# Patient Record
Sex: Female | Born: 1965
Health system: Southern US, Community
[De-identification: ages and names within clinical notes are randomized; demographics above are authoritative.]

## PROBLEM LIST (undated history)

## (undated) DIAGNOSIS — M1990 Unspecified osteoarthritis, unspecified site: Secondary | ICD-10-CM

## (undated) DIAGNOSIS — F909 Attention-deficit hyperactivity disorder, unspecified type: Secondary | ICD-10-CM

## (undated) DIAGNOSIS — E538 Deficiency of other specified B group vitamins: Secondary | ICD-10-CM

## (undated) DIAGNOSIS — R0602 Shortness of breath: Secondary | ICD-10-CM

## (undated) DIAGNOSIS — F419 Anxiety disorder, unspecified: Secondary | ICD-10-CM

## (undated) DIAGNOSIS — J302 Other seasonal allergic rhinitis: Secondary | ICD-10-CM

## (undated) DIAGNOSIS — E669 Obesity, unspecified: Secondary | ICD-10-CM

## (undated) DIAGNOSIS — K760 Fatty (change of) liver, not elsewhere classified: Secondary | ICD-10-CM

## (undated) DIAGNOSIS — F1010 Alcohol abuse, uncomplicated: Secondary | ICD-10-CM

## (undated) DIAGNOSIS — J45909 Unspecified asthma, uncomplicated: Secondary | ICD-10-CM

## (undated) DIAGNOSIS — F1990 Other psychoactive substance use, unspecified, uncomplicated: Secondary | ICD-10-CM

## (undated) DIAGNOSIS — D219 Benign neoplasm of connective and other soft tissue, unspecified: Secondary | ICD-10-CM

## (undated) DIAGNOSIS — I10 Essential (primary) hypertension: Secondary | ICD-10-CM

## (undated) DIAGNOSIS — I8390 Asymptomatic varicose veins of unspecified lower extremity: Secondary | ICD-10-CM

## (undated) DIAGNOSIS — G4730 Sleep apnea, unspecified: Secondary | ICD-10-CM

## (undated) DIAGNOSIS — R600 Localized edema: Secondary | ICD-10-CM

## (undated) DIAGNOSIS — F32A Depression, unspecified: Secondary | ICD-10-CM

## (undated) DIAGNOSIS — F329 Major depressive disorder, single episode, unspecified: Secondary | ICD-10-CM

## (undated) HISTORY — DX: Essential (primary) hypertension: I10

## (undated) HISTORY — DX: Anxiety disorder, unspecified: F41.9

## (undated) HISTORY — PX: COLONOSCOPY: SHX174

## (undated) HISTORY — DX: Attention-deficit hyperactivity disorder, unspecified type: F90.9

## (undated) HISTORY — DX: Sleep apnea, unspecified: G47.30

## (undated) HISTORY — DX: Deficiency of other specified B group vitamins: E53.8

## (undated) HISTORY — DX: Shortness of breath: R06.02

## (undated) HISTORY — PX: TONSILLECTOMY: SUR1361

## (undated) HISTORY — DX: Localized edema: R60.0

## (undated) HISTORY — DX: Other psychoactive substance use, unspecified, uncomplicated: F19.90

## (undated) HISTORY — DX: Fatty (change of) liver, not elsewhere classified: K76.0

## (undated) HISTORY — DX: Alcohol abuse, uncomplicated: F10.10

## (undated) HISTORY — DX: Obesity, unspecified: E66.9

---

## 1999-06-10 ENCOUNTER — Other Ambulatory Visit: Admission: RE | Admit: 1999-06-10 | Discharge: 1999-06-10 | Payer: Self-pay | Admitting: Obstetrics and Gynecology

## 1999-08-09 ENCOUNTER — Encounter: Admission: RE | Admit: 1999-08-09 | Discharge: 1999-08-09 | Payer: Self-pay | Admitting: Obstetrics and Gynecology

## 1999-08-09 ENCOUNTER — Encounter: Payer: Self-pay | Admitting: Obstetrics and Gynecology

## 1999-08-12 ENCOUNTER — Encounter: Admission: RE | Admit: 1999-08-12 | Discharge: 1999-08-12 | Payer: Self-pay | Admitting: Obstetrics and Gynecology

## 1999-08-12 ENCOUNTER — Encounter: Payer: Self-pay | Admitting: Obstetrics and Gynecology

## 2001-08-08 ENCOUNTER — Other Ambulatory Visit: Admission: RE | Admit: 2001-08-08 | Discharge: 2001-08-08 | Payer: Self-pay | Admitting: Obstetrics and Gynecology

## 2002-11-13 ENCOUNTER — Encounter: Payer: Self-pay | Admitting: Obstetrics and Gynecology

## 2002-11-13 ENCOUNTER — Encounter: Admission: RE | Admit: 2002-11-13 | Discharge: 2002-11-13 | Payer: Self-pay | Admitting: Obstetrics and Gynecology

## 2003-12-29 ENCOUNTER — Encounter: Admission: RE | Admit: 2003-12-29 | Discharge: 2003-12-29 | Payer: Self-pay | Admitting: Obstetrics and Gynecology

## 2005-06-05 ENCOUNTER — Ambulatory Visit (HOSPITAL_COMMUNITY): Admission: RE | Admit: 2005-06-05 | Discharge: 2005-06-05 | Payer: Self-pay | Admitting: Family Medicine

## 2005-06-07 ENCOUNTER — Encounter: Admission: RE | Admit: 2005-06-07 | Discharge: 2005-06-07 | Payer: Self-pay | Admitting: Obstetrics and Gynecology

## 2005-08-22 ENCOUNTER — Encounter: Admission: RE | Admit: 2005-08-22 | Discharge: 2005-08-22 | Payer: Self-pay | Admitting: *Deleted

## 2005-12-01 ENCOUNTER — Ambulatory Visit (HOSPITAL_COMMUNITY): Admission: RE | Admit: 2005-12-01 | Discharge: 2005-12-01 | Payer: Self-pay | Admitting: Obstetrics and Gynecology

## 2005-12-01 ENCOUNTER — Encounter (INDEPENDENT_AMBULATORY_CARE_PROVIDER_SITE_OTHER): Payer: Self-pay | Admitting: Specialist

## 2006-06-26 ENCOUNTER — Encounter: Admission: RE | Admit: 2006-06-26 | Discharge: 2006-06-26 | Payer: Self-pay | Admitting: Obstetrics and Gynecology

## 2007-07-01 ENCOUNTER — Encounter: Admission: RE | Admit: 2007-07-01 | Discharge: 2007-07-01 | Payer: Self-pay | Admitting: Obstetrics and Gynecology

## 2008-07-01 ENCOUNTER — Encounter: Admission: RE | Admit: 2008-07-01 | Discharge: 2008-07-01 | Payer: Self-pay | Admitting: Obstetrics and Gynecology

## 2009-07-02 ENCOUNTER — Encounter: Admission: RE | Admit: 2009-07-02 | Discharge: 2009-07-02 | Payer: Self-pay | Admitting: Obstetrics and Gynecology

## 2010-07-05 ENCOUNTER — Encounter
Admission: RE | Admit: 2010-07-05 | Discharge: 2010-07-05 | Payer: Self-pay | Source: Home / Self Care | Attending: Obstetrics and Gynecology | Admitting: Obstetrics and Gynecology

## 2010-10-17 ENCOUNTER — Other Ambulatory Visit: Payer: Self-pay | Admitting: Obstetrics and Gynecology

## 2010-10-28 ENCOUNTER — Encounter: Payer: Self-pay | Admitting: Genetic Counselor

## 2010-11-07 ENCOUNTER — Encounter: Payer: 59 | Admitting: Genetic Counselor

## 2010-12-09 NOTE — Op Note (Signed)
Mary Morrison, Mary Morrison            ACCOUNT NO.:  [id number]   MEDICAL RECORD NO.:  [id number]          PATIENT TYPE:  AMB   LOCATION:  SDC                           FACILITY:  [facility]   PHYSICIAN:  Maxie Better, M.D.DATE OF BIRTH:  [birth year]   DATE OF PROCEDURE:  12/01/2005  DATE OF DISCHARGE:  12/01/2005                                 OPERATIVE REPORT   PREOPERATIVE DIAGNOSES:  1.  Menorrhagia.  2.  Endometrial mass.   PROCEDURE:  Diagnostic hysteroscopy, dilation and curettage.   POSTOPERATIVE DIAGNOSES:  1.  Menorrhagia.  2.  Endometrial mass, pending final pathology.   ANESTHESIA:  General, paracervical block.   SURGEON:  Maxie Better, M.D.   PROCEDURE:  Under adequate general anesthesia, the patient was placed in the  dorsolithotomy position.  She was sterilely prepped and draped in usual  fashion.  The bladder was catheterized of a small amount of urine.  Examination under anesthesia revealed an anteverted uterus..  No adnexal  masses could be appreciated.  Bivalve speculum was placed in the vagina; 10  cc of 1% Nesacaine was injected paracervically at 3 o'clock and 9 o'clock.  The cervix was then grasped with a single-tooth tenaculum anteriorly.  The  cervix was then serially dilated up to a #25 Pratt dilator.  A diagnostic  hysteroscope was introduced into the uterine cavity.  The left tubal ostia  was patent; the right was sclerosed.  The endocervical canal had no lesions.  The posterior and right lateral wall had thickening of the endometrium,  found in a polypoid fashion.  The hysteroscope was removed.  The cavity was  then curetted.  The hysteroscope was then reinserted, to assess the amount  of tissue that had been removed.   Due to difficulty with  distension medium in  keeping the cavity fully  opened, decision was made to dilate the cervix up to #31 [facility] dilator; and  a resectoscope was introduced into the uterine cavity.  The cavity was  further  inspected.  No polypoid lesions were subsequently noted after  additional curettage had been previously done; at which time the procedure  was felt to have been complete.  The endocervical canal was once again  examined.  No lesions noted.   The procedure was then terminated by removing all instruments from the  vagina.  Specimen labeled endometrial curetting; possible endometrial polyp  was sent to pathology.   ESTIMATED BLOOD LOSS:  Minimal.   FLUID DEFICIT:  100 cc.   COMPLICATION:  None.   DISPOSITION:  The patient tolerated the procedure well and was transferred  to the recovery room in stable condition.      Maxie Better, M.D.  Electronically Signed     [place]/MEDQ  D:  12/01/2005  T:  12/04/2005  Job:  [id number]

## 2011-06-22 ENCOUNTER — Other Ambulatory Visit: Payer: Self-pay | Admitting: Obstetrics and Gynecology

## 2011-06-22 DIAGNOSIS — Z1231 Encounter for screening mammogram for malignant neoplasm of breast: Secondary | ICD-10-CM

## 2011-07-07 ENCOUNTER — Ambulatory Visit
Admission: RE | Admit: 2011-07-07 | Discharge: 2011-07-07 | Disposition: A | Discharge status: Home / Self Care | Payer: 59 | Source: Ambulatory Visit | Attending: Obstetrics and Gynecology | Admitting: Obstetrics and Gynecology

## 2011-07-07 DIAGNOSIS — Z1231 Encounter for screening mammogram for malignant neoplasm of breast: Secondary | ICD-10-CM

## 2012-06-03 ENCOUNTER — Other Ambulatory Visit: Payer: Self-pay | Admitting: Obstetrics and Gynecology

## 2012-06-03 DIAGNOSIS — Z1231 Encounter for screening mammogram for malignant neoplasm of breast: Secondary | ICD-10-CM

## 2012-07-11 ENCOUNTER — Ambulatory Visit
Admission: RE | Admit: 2012-07-11 | Discharge: 2012-07-11 | Disposition: A | Discharge status: Home / Self Care | Payer: 59 | Source: Ambulatory Visit | Attending: Obstetrics and Gynecology | Admitting: Obstetrics and Gynecology

## 2012-07-11 DIAGNOSIS — Z1231 Encounter for screening mammogram for malignant neoplasm of breast: Secondary | ICD-10-CM

## 2013-06-13 ENCOUNTER — Other Ambulatory Visit: Payer: Self-pay

## 2013-06-13 DIAGNOSIS — Z1231 Encounter for screening mammogram for malignant neoplasm of breast: Secondary | ICD-10-CM

## 2013-07-22 ENCOUNTER — Ambulatory Visit
Admission: RE | Admit: 2013-07-22 | Discharge: 2013-07-22 | Disposition: A | Discharge status: Home / Self Care | Payer: 59 | Source: Ambulatory Visit

## 2013-07-22 DIAGNOSIS — Z1231 Encounter for screening mammogram for malignant neoplasm of breast: Secondary | ICD-10-CM

## 2013-07-28 ENCOUNTER — Other Ambulatory Visit: Payer: Self-pay | Admitting: Obstetrics and Gynecology

## 2013-07-28 DIAGNOSIS — R928 Other abnormal and inconclusive findings on diagnostic imaging of breast: Secondary | ICD-10-CM

## 2013-08-01 ENCOUNTER — Ambulatory Visit
Admission: RE | Admit: 2013-08-01 | Discharge: 2013-08-01 | Disposition: A | Discharge status: Home / Self Care | Payer: 59 | Source: Ambulatory Visit | Attending: Obstetrics and Gynecology | Admitting: Obstetrics and Gynecology

## 2013-08-01 DIAGNOSIS — R928 Other abnormal and inconclusive findings on diagnostic imaging of breast: Secondary | ICD-10-CM

## 2013-08-28 ENCOUNTER — Encounter (INDEPENDENT_AMBULATORY_CARE_PROVIDER_SITE_OTHER): Payer: Self-pay | Admitting: General Surgery

## 2013-08-28 ENCOUNTER — Ambulatory Visit (INDEPENDENT_AMBULATORY_CARE_PROVIDER_SITE_OTHER): Payer: 59 | Admitting: General Surgery

## 2013-08-28 DIAGNOSIS — K2100 Gastro-esophageal reflux disease with esophagitis, without bleeding: Secondary | ICD-10-CM

## 2013-08-28 DIAGNOSIS — R609 Edema, unspecified: Secondary | ICD-10-CM

## 2013-08-28 DIAGNOSIS — Z6839 Body mass index (BMI) 39.0-39.9, adult: Secondary | ICD-10-CM

## 2013-08-28 NOTE — Addendum Note (Signed)
Addended by: Jeralyn Ruths on: 08/28/2013 12:33 PM   Modules accepted: Orders

## 2013-08-28 NOTE — Progress Notes (Signed)
Subjective:   obesity, joint pain  Patient ID: Mary Morrison, female   DOB: 1966-03-23, 48 y.o.   MRN: [id number]  HPI Mary Morrison y.o.female presents for consideration for surgical treatment for morbid obesity.  She  gives a history of progressive obesity since adolescence despite multiple attempts at medical management.  her weight has been affecting her in a number of ways including difficulty performing routine activities of daily living and participating in activities with friends. She has begun to see some deterioration in her health related to her weight which concerned her. She has knee pain which limits her physical activity. She has some moderate reflux requiring over-the-counter medications. She has noted some intermittent stress incontinence. She also has significant lower extremity edema which has worsened as her weight has increased.   She has been to our initial information seminar, researched surgical options thoroughly and is interested in lap band.  History reviewed. No pertinent past medical history. Past Surgical History  Procedure Laterality Date  . Tonsillectomy    . Cesarean section  1989   Current Outpatient Prescriptions  Medication Sig Dispense Refill  . Probiotic Product (PROBIOTIC DAILY PO) Take by mouth.       No current facility-administered medications for this visit.   Allergies not on file History  Substance Use Topics  . Smoking status: Never Smoker   . Smokeless tobacco: Not on file  . Alcohol Use: No       Review of Systems  Constitutional: Positive for fatigue.  Respiratory: Positive for shortness of breath. Negative for cough, choking and stridor.        Mild shortness of breath on exertion  Cardiovascular: Negative.   Gastrointestinal: Positive for blood in stool. Negative for abdominal pain.       Moderate GERD  Genitourinary:       Positive for stress incontinence  Musculoskeletal: Positive for arthralgias.       Objective:    Physical Exam BP 158/82  Pulse 72  Resp 16  Ht 5' 6.5" (1.689 m)  Wt 243 lb 3.2 oz (110.315 kg)  BMI 38.67 kg/m2  General: Alert, Obese Caucasian female, in no distress Skin: Warm and dry without rash or infection. HEENT: No palpable masses or thyromegaly. Sclera nonicteric. Pupils equal round and reactive. Oropharynx clear. Lymph nodes: No cervical, supraclavicular, or inguinal nodes palpable. Lungs: Breath sounds clear and equal without increased work of breathing Cardiovascular: Regular rate and rhythm without murmur. No JVD or edema. Peripheral pulses intact. Abdomen: Nondistended. Soft and nontender. No masses palpable. No organomegaly. No palpable hernias. Extremities: 1-2+ lower extremity edema bilaterally. no joint swelling or deformity. No chronic venous stasis changes. Neurologic: Alert and fully oriented. Gait normal.    Assessment:     Patient with progressive morbid obesity unresponsive to multiple efforts at medical management who presents with a BMI of 39 and comorbidities of lower extremity edema, GERD, joint pain and urinary stress incontinence.. I believe there would be very significant medical benefit from surgical weight loss. After our discussion of surgical options currently available the patient has decided to proceed with LAP-BAND placement due to the reasons above. We have discussed the nature of morbid obesity and the risk of remaining obese. We discussed the indications for the procedure, its nature, and expected recovery. The risks of the procedure were discussed in detail including anesthetic complications, bleeding, infection, visceral injury, dysphagia, and long-term risks of mechanical failure, slippage, erosion, esophageal dilatation and failure to lose weight. Rare risk of  death was discussed. The patient was given a complete consent form and all questions were answered. We will proceed with preoperative workup including routine lab and x-rays, psychologic and  nutrition evaluations, and upper GI series.  I will see the patient back following this evaluation.        Plan:     As above.

## 2013-09-02 ENCOUNTER — Other Ambulatory Visit (INDEPENDENT_AMBULATORY_CARE_PROVIDER_SITE_OTHER): Payer: Self-pay

## 2013-09-23 ENCOUNTER — Other Ambulatory Visit: Payer: Self-pay

## 2013-09-23 ENCOUNTER — Other Ambulatory Visit (INDEPENDENT_AMBULATORY_CARE_PROVIDER_SITE_OTHER): Payer: Self-pay

## 2013-09-23 ENCOUNTER — Ambulatory Visit (HOSPITAL_COMMUNITY)
Admission: RE | Admit: 2013-09-23 | Discharge: 2013-09-23 | Disposition: A | Discharge status: Home / Self Care | Payer: 59 | Source: Ambulatory Visit | Attending: General Surgery | Admitting: General Surgery

## 2013-09-23 DIAGNOSIS — R0602 Shortness of breath: Secondary | ICD-10-CM | POA: Insufficient documentation

## 2013-09-23 DIAGNOSIS — M25569 Pain in unspecified knee: Secondary | ICD-10-CM | POA: Insufficient documentation

## 2013-09-23 DIAGNOSIS — Z01818 Encounter for other preprocedural examination: Secondary | ICD-10-CM | POA: Insufficient documentation

## 2013-09-23 DIAGNOSIS — J984 Other disorders of lung: Secondary | ICD-10-CM | POA: Insufficient documentation

## 2013-09-23 DIAGNOSIS — Z6838 Body mass index (BMI) 38.0-38.9, adult: Secondary | ICD-10-CM | POA: Insufficient documentation

## 2013-09-23 DIAGNOSIS — K219 Gastro-esophageal reflux disease without esophagitis: Secondary | ICD-10-CM | POA: Insufficient documentation

## 2013-09-23 LAB — CBC
HCT: 38.1 % (ref 36.0–46.0)
Hemoglobin: 12.7 g/dL (ref 12.0–15.0)
MCH: 30.2 pg (ref 26.0–34.0)
MCHC: 33.3 g/dL (ref 30.0–36.0)
MCV: 90.7 fL (ref 78.0–100.0)
Platelets: 249 10*3/uL (ref 150–400)
RBC: 4.2 MIL/uL (ref 3.87–5.11)
RDW: 14.6 % (ref 11.5–15.5)
WBC: 4.5 10*3/uL (ref 4.0–10.5)

## 2013-09-23 LAB — COMPREHENSIVE METABOLIC PANEL
ALT: 24 U/L (ref 0–35)
AST: 18 U/L (ref 0–37)
Albumin: 4.4 g/dL (ref 3.5–5.2)
Alkaline Phosphatase: 46 U/L (ref 39–117)
BUN: 10 mg/dL (ref 6–23)
CO2: 28 mEq/L (ref 19–32)
Calcium: 9.2 mg/dL (ref 8.4–10.5)
Chloride: 102 mEq/L (ref 96–112)
Creat: 0.77 mg/dL (ref 0.50–1.10)
Glucose, Bld: 81 mg/dL (ref 70–99)
Potassium: 4 mEq/L (ref 3.5–5.3)
Sodium: 139 mEq/L (ref 135–145)
Total Bilirubin: 0.5 mg/dL (ref 0.2–1.2)
Total Protein: 6.4 g/dL (ref 6.0–8.3)

## 2013-09-23 LAB — T4: T4, Total: 9.2 ug/dL (ref 5.0–12.5)

## 2013-09-23 LAB — TSH: TSH: 1.302 u[IU]/mL (ref 0.350–4.500)

## 2013-09-23 LAB — LIPID PANEL
Cholesterol: 183 mg/dL (ref 0–200)
HDL: 52 mg/dL (ref >39)
LDL Cholesterol: 118 mg/dL — ABNORMAL HIGH (ref 0–99)
Total CHOL/HDL Ratio: 3.5 Ratio
Triglycerides: 65 mg/dL (ref <150)
VLDL: 13 mg/dL (ref 0–40)

## 2013-09-24 LAB — HCG, SERUM, QUALITATIVE: Preg, Serum: NEGATIVE

## 2013-10-09 ENCOUNTER — Encounter: Payer: 59 | Attending: Family Medicine | Admitting: Dietician

## 2013-10-09 ENCOUNTER — Encounter: Payer: Self-pay | Admitting: Dietician

## 2013-10-09 DIAGNOSIS — Z713 Dietary counseling and surveillance: Secondary | ICD-10-CM | POA: Insufficient documentation

## 2013-10-09 NOTE — Patient Instructions (Signed)
Patient to call the [facility] to enroll in Pre-Op and Post-Op Nutrition Education when surgery date is scheduled. 

## 2013-10-09 NOTE — Progress Notes (Signed)
  Pre-Op Assessment Visit:  Pre-Operative LAGB Surgery  Medical Nutrition Therapy:  Appt start time: 9480   End time:  1215.  Patient was seen on 10/09/2013 for Pre-Operative LAGB Nutrition Assessment. Assessment and letter of approval faxed to [facility] Bariatric Surgery Program coordinator on 10/09/2013.   Preferred Learning Style:   No preference indicated   Learning Readiness:   Contemplating  Handouts given during visit include:  Pre-Op Goals Bariatric Surgery Protein Shakes Bariatric Surgery Fast Food guide  Teaching Method Utilized: Visual Auditory Hands on  Barriers to learning/adherence to lifestyle change: food preferences  Demonstrated degree of understanding via:  Teach Back   Patient to call the [facility] to enroll in Pre-Op and Post-Op Nutrition Education when surgery date is scheduled.

## 2013-11-07 ENCOUNTER — Other Ambulatory Visit: Payer: Self-pay | Admitting: Family Medicine

## 2013-11-07 ENCOUNTER — Ambulatory Visit
Admission: RE | Admit: 2013-11-07 | Discharge: 2013-11-07 | Disposition: A | Discharge status: Home / Self Care | Payer: 59 | Source: Ambulatory Visit | Attending: Family Medicine | Admitting: Family Medicine

## 2013-11-07 DIAGNOSIS — R042 Hemoptysis: Secondary | ICD-10-CM

## 2014-01-20 ENCOUNTER — Encounter (INDEPENDENT_AMBULATORY_CARE_PROVIDER_SITE_OTHER): Payer: Self-pay | Admitting: Surgery

## 2014-01-20 ENCOUNTER — Ambulatory Visit (INDEPENDENT_AMBULATORY_CARE_PROVIDER_SITE_OTHER): Payer: 59 | Admitting: Surgery

## 2014-01-20 VITALS — BP 128/84 | HR 66 | Temp 97.3°F | Resp 18 | Ht 66.0 in | Wt 249.4 lb

## 2014-01-20 DIAGNOSIS — R609 Edema, unspecified: Secondary | ICD-10-CM | POA: Insufficient documentation

## 2014-01-20 NOTE — Progress Notes (Signed)
Subjective:   obesity, joint pain  Patient ID: Mary Morrison, female   DOB: [birth year], 48 y.o.   MRN: [id number]  HPI Mary Morrison y.o.female presents for consideration for surgical treatment for morbid obesity.  She  gives a history of progressive obesity since adolescence despite multiple attempts at medical management.  Her weight has been affecting her in a number of ways including difficulty performing routine activities of daily living and participating in activities with friends. She has begun to see some deterioration in her health related to her weight which concerned her. She has knee pain which limits her physical activity. She has some moderate reflux requiring over-the-counter medications. She has noted some intermittent stress incontinence. She also has significant lower extremity edema which has worsened as her weight has increased.   She has been to our initial information seminar, researched surgical options thoroughly and is interested in lap band. She has completed the requisite workup including insightful discussions with Dr. Ardath Sax.  She has switched from Dr. Excell Seltzer to me because of the [employer] directive.    Past Medical History  Diagnosis Date  . Obesity    Past Surgical History  Procedure Laterality Date  . Tonsillectomy    . Cesarean section  1989   Current Outpatient Prescriptions  Medication Sig Dispense Refill  . human chorionic gonadotropin (PREGNYL/NOVAREL) 10000 UNITS injection Inject 10,000 Units into the muscle once.      Marland Kitchen MAGNESIUM CITRATE PO Take by mouth.      . Probiotic Product (PROBIOTIC DAILY PO) Take by mouth.       No current facility-administered medications for this visit.   No Known Allergies History  Substance Use Topics  . Smoking status: Never Smoker   . Smokeless tobacco: Not on file  . Alcohol Use: No       Review of Systems  Constitutional: Positive for fatigue.  Respiratory: Positive for shortness of breath.  Negative for cough, choking and stridor.        Mild shortness of breath on exertion  Cardiovascular: Negative.   Gastrointestinal: Positive for blood in stool. Negative for abdominal pain.       Moderate GERD  Genitourinary:       Positive for stress incontinence  Musculoskeletal: Positive for arthralgias.       Objective:   Physical Exam BP 128/84  Pulse 66  Temp(Src) 97.3 F (36.3 C) (Temporal)  Resp 18  Ht 5\' 6"  (1.676 m)  Wt 249 lb 6.4 oz (113.127 kg)  BMI 40.27 kg/m2  General: Alert, Obese Caucasian female, in no distress Skin: Warm and dry without rash or infection. HEENT: No palpable masses or thyromegaly. Sclera nonicteric. Pupils equal round and reactive. Oropharynx clear. Lymph nodes: No cervical, supraclavicular, or inguinal nodes palpable. Lungs: Breath sounds clear and equal without increased work of breathing Cardiovascular: Regular rate and rhythm without murmur. No JVD or edema. Peripheral pulses intact. Abdomen: Nondistended. Soft and nontender. No masses palpable. No organomegaly. No palpable hernias. Extremities: 1-2+ lower extremity edema bilaterally. no joint swelling or deformity. No chronic venous stasis changes. Neurologic: Alert and fully oriented. Gait normal.    Assessment:     Patient with progressive morbid obesity unresponsive to multiple efforts at medical management who presents with a BMI of 39 and comorbidities of lower extremity edema, GERD, joint pain and urinary stress incontinence.. I believe there would be very significant medical benefit from surgical weight loss. After our discussion of surgical options currently available the patient has  decided to proceed with LAP-BAND placement due to the reasons above. We have discussed the nature of morbid obesity and the risk of remaining obese. We discussed the indications for the procedure, its nature, and expected recovery. The risks of the procedure were discussed in detail including anesthetic  complications, bleeding, infection, visceral injury, dysphagia, and long-term risks of mechanical failure, slippage, erosion, esophageal dilatation and failure to lose weight. Rare risk of death was discussed. The patient was given a complete consent form and all questions were answered. She has undergone UGI (negative) and is ready to schedule Lapband placement.  I reviewed the procedure with her in some detail and took her to [employer] office for scheduling.          Plan:     Placement of [place] Hassell Done, MD, [facility] [phone] beeper [phone]  01/20/2014 4:41 PM

## 2014-01-20 NOTE — Patient Instructions (Signed)
Laparoscopic Gastric Band Surgery Care After These instructions give you information on caring for yourself after your procedure. Your doctor may also give you more specific instructions. Call your doctor if you have any problems or questions after your procedure. HOME CARE   Take walks throughout the day. Do not sit for longer than 1 hour while awake for 4 to 6 weeks.  You may shower 2 days after surgery. Pat the surgery cuts (incisions) dry. Do not rub the surgery cuts.  Do your coughing and deep breathing exercises.  Do not lift, push, or pull anything heavy until your doctor says it is okay.  Only take medicines as told by your doctor. Do not drive while taking pain medicine.  Drink plenty of fluids to keep your pee (urine) clear or pale yellow.  Stay on a liquid diet as long as your doctor tells you to.  Do not drink caffeine for 1 month.  Change bandages (dressings) as told by your doctor.  Check your surgery cuts for redness, pufffiness (swelling), abnormal coloring, fluid, or bleeding.  Follow your doctor's advice about vitamin and protein needs after surgery. GET HELP RIGHT AWAY IF:  You feel sick to your stomach (nauseous) and throw up (vomit).  You have pain and discomfort with swallowing.  You develop shortness of breath or difficulty breathing.  You have pain, puffiness, or feel warmth on your lower body.  You have very bad calf pain or pain not relieved by medicine.  You have a temperature by mouth above 102 F (38.9 C).  Your surgery cuts look red, puffy, or they leak fluid.  Your poop (stool) is black, tarry, or dark red.  You have chills.  You have chest pain.  You feel confused.  You have slurred speech.  You feel lightheaded when standing.  You suddenly feel weak.  You have any questions or concerns. MAKE SURE YOU:   Understand these instructions.  Will watch your condition.  Will get help right away if you are not doing well or get  worse. Document Released: 08/12/2010 Document Revised: 11/04/2012 Document Reviewed: 08/12/2010 [facility] Patient Information 2015 [place], [place]. This information is not intended to replace advice given to you by your health care . Make sure you discuss any questions you have with your health care .

## 2014-01-28 NOTE — Progress Notes (Signed)
Dr Hassell Done-   Need Pre Op Orders when you can PLEASE.   Thanks

## 2014-01-30 ENCOUNTER — Encounter (HOSPITAL_COMMUNITY): Payer: Self-pay | Admitting: Pharmacy Technician

## 2014-02-02 ENCOUNTER — Encounter: Payer: 59 | Attending: Surgery

## 2014-02-02 DIAGNOSIS — Z713 Dietary counseling and surveillance: Secondary | ICD-10-CM | POA: Insufficient documentation

## 2014-02-02 DIAGNOSIS — Z6839 Body mass index (BMI) 39.0-39.9, adult: Secondary | ICD-10-CM | POA: Diagnosis not present

## 2014-02-04 NOTE — Progress Notes (Signed)
  Pre-Operative Nutrition Class:  Appt start time: 2707   End time:  1830.  Patient was seen on 02/02/14 for Pre-Operative Bariatric Surgery Education at [facility].   Surgery date: 02/17/2014 Surgery type: LAGB Start weight at [facility]: 247 lbs on 10/09/13 Weight today: 247.5 lbs  TANITA  BODY COMP RESULTS  02/02/14   BMI (kg/m^2) 39.9   Fat Mass (lbs) 109   Fat Free Mass (lbs) 138.5   Total Body Water (lbs) 101.5    Samples given per MNT protocol. Patient educated on appropriate usage: Premier protein shake (strawberry - qty 1) Lot #: [id number] Exp: 11/2014  Bariatric Advantage Calcium Citrate (chocolate - qty 1) Lot #: [id number] Exp: 12/2014  Celebrate Vitamins Multivitamin (orange - qty 1) Lot #: [id number] Exp: 08/2014  Renee Pain Protein Powder (vanilla - qty 1) Lot #: [id number] Exp: 04/2015 The following the learning objectives were met by the patient during this course:  Identify Pre-Op Dietary Goals and will begin 2 weeks pre-operatively  Identify appropriate sources of fluids and proteins   State protein recommendations and appropriate sources pre and post-operatively  Identify Post-Operative Dietary Goals and will follow for 2 weeks post-operatively  Identify appropriate multivitamin and calcium sources  Describe the need for physical activity post-operatively and will follow MD recommendations  State when to call healthcare provider regarding medication questions or post-operative complications  Handouts given during class include:  Pre-Op Bariatric Surgery Diet Handout  Protein Shake Handout  Post-Op Bariatric Surgery Nutrition Handout  BELT Program Information Flyer  Support Group Information Flyer  [facility] Bariatric Supplements Price List  Follow-Up Plan: Patient will follow-up at [facility] 2 weeks post operatively for diet advancement per MD.

## 2014-02-10 ENCOUNTER — Encounter (HOSPITAL_COMMUNITY): Payer: Self-pay

## 2014-02-10 ENCOUNTER — Encounter (HOSPITAL_COMMUNITY)
Admission: RE | Admit: 2014-02-10 | Discharge: 2014-02-10 | Disposition: A | Discharge status: Home / Self Care | Payer: 59 | Source: Ambulatory Visit | Attending: Surgery | Admitting: Surgery

## 2014-02-10 DIAGNOSIS — Z01812 Encounter for preprocedural laboratory examination: Secondary | ICD-10-CM | POA: Insufficient documentation

## 2014-02-10 DIAGNOSIS — Z01818 Encounter for other preprocedural examination: Secondary | ICD-10-CM | POA: Insufficient documentation

## 2014-02-10 HISTORY — DX: Other seasonal allergic rhinitis: J30.2

## 2014-02-10 LAB — CBC
HCT: 37.9 % (ref 36.0–46.0)
Hemoglobin: 12.1 g/dL (ref 12.0–15.0)
MCH: 29.2 pg (ref 26.0–34.0)
MCHC: 31.9 g/dL (ref 30.0–36.0)
MCV: 91.3 fL (ref 78.0–100.0)
Platelets: 285 10*3/uL (ref 150–400)
RBC: 4.15 MIL/uL (ref 3.87–5.11)
RDW: 15 % (ref 11.5–15.5)
WBC: 4.6 10*3/uL (ref 4.0–10.5)

## 2014-02-10 LAB — COMPREHENSIVE METABOLIC PANEL
ALT: 23 U/L (ref 0–35)
AST: 19 U/L (ref 0–37)
Albumin: 3.9 g/dL (ref 3.5–5.2)
Alkaline Phosphatase: 50 U/L (ref 39–117)
Anion gap: 11 (ref 5–15)
BUN: 10 mg/dL (ref 6–23)
CO2: 26 mEq/L (ref 19–32)
Calcium: 9.4 mg/dL (ref 8.4–10.5)
Chloride: 104 mEq/L (ref 96–112)
Creatinine, Ser: 0.86 mg/dL (ref 0.50–1.10)
GFR calc Af Amer: >90 mL/min (ref >90)
GFR calc non Af Amer: 79 mL/min — ABNORMAL LOW (ref >90)
Glucose, Bld: 104 mg/dL — ABNORMAL HIGH (ref 70–99)
Potassium: 4.2 mEq/L (ref 3.7–5.3)
Sodium: 141 mEq/L (ref 137–147)
Total Bilirubin: 0.2 mg/dL — ABNORMAL LOW (ref 0.3–1.2)
Total Protein: 6.7 g/dL (ref 6.0–8.3)

## 2014-02-10 NOTE — Patient Instructions (Signed)
YOUR SURGERY IS SCHEDULED AT [facility]  ON:  Tuesday  7/28  REPORT TO  SHORT STAY CENTER AT:  7:45 AM   PLEASE COME IN THE [place] ENTRANCE AND FOLLOW SIGNS TO SHORT STAY CENTER.  DO NOT EAT OR DRINK ANYTHING AFTER MIDNIGHT THE NIGHT BEFORE YOUR SURGERY.  YOU MAY BRUSH YOUR TEETH, RINSE OUT YOUR MOUTH--BUT NO WATER, NO FOOD, NO CHEWING GUM, NO MINTS, NO CANDIES, NO CHEWING TOBACCO.  PLEASE TAKE THE FOLLOWING MEDICATIONS THE AM OF YOUR SURGERY WITH A FEW SIPS OF WATER:  NO MEDICATIONS TO TAKE   DO NOT BRING VALUABLES, MONEY, CREDIT CARDS.  DO NOT WEAR JEWELRY, MAKE-UP, NAIL POLISH AND NO METAL PINS OR CLIPS IN YOUR HAIR. CONTACT LENS, DENTURES / PARTIALS, GLASSES SHOULD NOT BE WORN TO SURGERY AND IN MOST CASES-HEARING AIDS WILL NEED TO BE REMOVED.  BRING YOUR GLASSES CASE, ANY EQUIPMENT NEEDED FOR YOUR CONTACT LENS. FOR PATIENTS ADMITTED TO THE HOSPITAL--CHECK OUT TIME THE DAY OF DISCHARGE IS 11:00 AM.  ALL INPATIENT ROOMS ARE PRIVATE - WITH BATHROOM, TELEPHONE, TELEVISION AND WIFI INTERNET.  IF YOU ARE BEING DISCHARGED THE SAME DAY OF YOUR SURGERY--YOU CAN NOT DRIVE YOURSELF HOME--AND SHOULD NOT GO HOME ALONE BY TAXI OR BUS.  NO DRIVING OR OPERATING MACHINERY, OR MAKING LEGAL DECISIONS FOR 24 HOURS FOLLOWING ANESTHESIA / PAIN MEDICATIONS.  PLEASE MAKE ARRANGEMENTS FOR SOMEONE TO BE WITH YOU AT HOME THE FIRST 24 HOURS AFTER SURGERY. RESPONSIBLE DRIVER'S NAME / PHONE   PT'S MOTHER ZOXW RUEAVW   [phone]                                                   GNFAOZ HYQM OVER ANY  FACT SHEETS THAT YOU WERE GIVEN: MRSA INFORMATION, BLOOD TRANSFUSION INFORMATION, INCENTIVE SPIROMETER INFORMATION.  PLEASE BE AWARE THAT YOU MAY NEED ADDITIONAL BLOOD DRAWN DAY OF YOUR SURGERY  _______________________________________________________________________   [facility] - Preparing for Surgery Before surgery, you can play an important role.  Because skin is not sterile, your  skin needs to be as free of germs as possible.  You can reduce the number of germs on your skin by washing with CHG (chlorahexidine gluconate) soap before surgery.  CHG is an antiseptic cleaner which kills germs and bonds with the skin to continue killing germs even after washing. Please DO NOT use if you have an allergy to CHG or antibacterial soaps.  If your skin becomes reddened/irritated stop using the CHG and inform your nurse when you arrive at Short Stay. Do not shave (including legs and underarms) for at least 48 hours prior to the first CHG shower.  You may shave your face/neck. Please follow these instructions carefully:  1.  Shower with CHG Soap the night before surgery and the  morning of Surgery.  2.  If you choose to wash your hair, wash your hair first as usual with your  normal  shampoo.  3.  After you shampoo, rinse your hair and body thoroughly to remove the  shampoo.                           4.  Use CHG as you would any other liquid soap.  You can apply chg directly  to the skin and wash  Gently with a scrungie or clean washcloth.  5.  Apply the CHG Soap to your body ONLY FROM THE NECK DOWN.   Do not use on face/ open                           Wound or open sores. Avoid contact with eyes, ears mouth and genitals (private parts).                       Wash face,  Genitals (private parts) with your normal soap.             6.  Wash thoroughly, paying special attention to the area where your surgery  will be performed.  7.  Thoroughly rinse your body with warm water from the neck down.  8.  DO NOT shower/wash with your normal soap after using and rinsing off  the CHG Soap.                9.  Pat yourself dry with a clean towel.            10.  Wear clean pajamas.            11.  Place clean sheets on your bed the night of your first shower and do not  sleep with pets. Day of Surgery : Do not apply any lotions/deodorants the morning of surgery.  Please wear  clean clothes to the hospital/surgery center.  FAILURE TO FOLLOW THESE INSTRUCTIONS MAY RESULT IN THE CANCELLATION OF YOUR SURGERY PATIENT SIGNATURE_________________________________  NURSE SIGNATURE__________________________________  ________________________________________________________________________

## 2014-02-10 NOTE — Pre-Procedure Instructions (Signed)
EKG REPORT 09/23/13  AND CXR REPORT 11/07/13 ARE IN EPIC

## 2014-02-16 ENCOUNTER — Other Ambulatory Visit (INDEPENDENT_AMBULATORY_CARE_PROVIDER_SITE_OTHER): Payer: Self-pay | Admitting: Surgery

## 2014-02-16 NOTE — Progress Notes (Signed)
Dr. Hassell Done - Please enter preop orders in Epic - pt's surgery is tomorrow  7/28.

## 2014-02-17 ENCOUNTER — Encounter (HOSPITAL_COMMUNITY): Payer: Self-pay | Admitting: *Deleted

## 2014-02-17 ENCOUNTER — Observation Stay (HOSPITAL_COMMUNITY)
Admission: RE | Admit: 2014-02-17 | Discharge: 2014-02-18 | Disposition: A | Discharge status: Home / Self Care | Payer: 59 | Source: Ambulatory Visit | Attending: Surgery | Admitting: Surgery

## 2014-02-17 ENCOUNTER — Encounter (HOSPITAL_COMMUNITY): Payer: 59 | Admitting: *Deleted

## 2014-02-17 ENCOUNTER — Inpatient Hospital Stay (HOSPITAL_COMMUNITY): Payer: 59 | Admitting: *Deleted

## 2014-02-17 ENCOUNTER — Encounter (HOSPITAL_COMMUNITY)
Admission: RE | Disposition: A | Discharge status: Home / Self Care | Payer: Self-pay | Source: Ambulatory Visit | Attending: Surgery

## 2014-02-17 DIAGNOSIS — K2100 Gastro-esophageal reflux disease with esophagitis, without bleeding: Secondary | ICD-10-CM

## 2014-02-17 DIAGNOSIS — K219 Gastro-esophageal reflux disease without esophagitis: Secondary | ICD-10-CM | POA: Insufficient documentation

## 2014-02-17 DIAGNOSIS — N393 Stress incontinence (female) (male): Secondary | ICD-10-CM | POA: Diagnosis not present

## 2014-02-17 DIAGNOSIS — R609 Edema, unspecified: Secondary | ICD-10-CM | POA: Diagnosis not present

## 2014-02-17 DIAGNOSIS — M2550 Pain in unspecified joint: Secondary | ICD-10-CM | POA: Insufficient documentation

## 2014-02-17 DIAGNOSIS — Z6841 Body Mass Index (BMI) 40.0 and over, adult: Secondary | ICD-10-CM

## 2014-02-17 DIAGNOSIS — Z6839 Body mass index (BMI) 39.0-39.9, adult: Secondary | ICD-10-CM | POA: Diagnosis not present

## 2014-02-17 DIAGNOSIS — Z9884 Bariatric surgery status: Secondary | ICD-10-CM

## 2014-02-17 HISTORY — PX: LAPAROSCOPIC GASTRIC BANDING: SHX1100

## 2014-02-17 LAB — CBC
HCT: 35.7 % — ABNORMAL LOW (ref 36.0–46.0)
Hemoglobin: 11.2 g/dL — ABNORMAL LOW (ref 12.0–15.0)
MCH: 28.5 pg (ref 26.0–34.0)
MCHC: 31.4 g/dL (ref 30.0–36.0)
MCV: 90.8 fL (ref 78.0–100.0)
Platelets: 218 10*3/uL (ref 150–400)
RBC: 3.93 MIL/uL (ref 3.87–5.11)
RDW: 14.8 % (ref 11.5–15.5)
WBC: 6.4 10*3/uL (ref 4.0–10.5)

## 2014-02-17 LAB — PREGNANCY, URINE: Preg Test, Ur: NEGATIVE

## 2014-02-17 LAB — CREATININE, SERUM
Creatinine, Ser: 0.86 mg/dL (ref 0.50–1.10)
GFR calc Af Amer: >90 mL/min (ref >90)
GFR calc non Af Amer: 79 mL/min — ABNORMAL LOW (ref >90)

## 2014-02-17 SURGERY — GASTRIC BANDING, LAPAROSCOPIC
Anesthesia: General | Site: Abdomen

## 2014-02-17 MED ORDER — ONDANSETRON HCL 4 MG/2ML IJ SOLN
4.0000 mg | INTRAMUSCULAR | Status: DC | PRN
  Administered 2014-02-17: 4 mg via INTRAVENOUS
  Filled 2014-02-17: qty 2

## 2014-02-17 MED ORDER — PROPOFOL 10 MG/ML IV BOLUS
INTRAVENOUS | Status: AC
  Filled 2014-02-17: qty 20

## 2014-02-17 MED ORDER — DEXAMETHASONE SODIUM PHOSPHATE 10 MG/ML IJ SOLN
INTRAMUSCULAR | Status: DC | PRN
  Administered 2014-02-17: 5 mg via INTRAVENOUS

## 2014-02-17 MED ORDER — UNJURY VANILLA POWDER: 2.0000 [oz_av] | Freq: Four times a day (QID) | ORAL | Status: DC

## 2014-02-17 MED ORDER — LIDOCAINE HCL (CARDIAC) 20 MG/ML IV SOLN
INTRAVENOUS | Status: AC
  Filled 2014-02-17: qty 5

## 2014-02-17 MED ORDER — FENTANYL CITRATE 0.05 MG/ML IJ SOLN
INTRAMUSCULAR | Status: DC | PRN
  Administered 2014-02-17: 50 ug via INTRAVENOUS
  Administered 2014-02-17: 100 ug via INTRAVENOUS
  Administered 2014-02-17 (×3): 50 ug via INTRAVENOUS

## 2014-02-17 MED ORDER — DEXTROSE 5 % IV SOLN
INTRAVENOUS | Status: AC
  Filled 2014-02-17: qty 2

## 2014-02-17 MED ORDER — GLYCOPYRROLATE 0.2 MG/ML IJ SOLN
INTRAMUSCULAR | Status: DC | PRN
  Administered 2014-02-17: 0.6 mg via INTRAVENOUS

## 2014-02-17 MED ORDER — SODIUM CHLORIDE 0.9 % IJ SOLN
INTRAMUSCULAR | Status: AC
  Filled 2014-02-17: qty 30

## 2014-02-17 MED ORDER — ROCURONIUM BROMIDE 100 MG/10ML IV SOLN
INTRAVENOUS | Status: DC | PRN
  Administered 2014-02-17: 50 mg via INTRAVENOUS
  Administered 2014-02-17: 10 mg via INTRAVENOUS

## 2014-02-17 MED ORDER — OXYCODONE HCL 5 MG/5ML PO SOLN: 5.0000 mg | ORAL | Status: DC | PRN

## 2014-02-17 MED ORDER — ONDANSETRON HCL 4 MG/2ML IJ SOLN
INTRAMUSCULAR | Status: DC | PRN
  Administered 2014-02-17: 4 mg via INTRAVENOUS

## 2014-02-17 MED ORDER — UNJURY CHOCOLATE CLASSIC POWDER
2.0000 [oz_av] | Freq: Four times a day (QID) | ORAL | Status: DC
  Administered 2014-02-18: 2 [oz_av] via ORAL

## 2014-02-17 MED ORDER — MORPHINE SULFATE 2 MG/ML IJ SOLN: 2.0000 mg | INTRAMUSCULAR | Status: DC | PRN

## 2014-02-17 MED ORDER — METOCLOPRAMIDE HCL 5 MG/ML IJ SOLN
INTRAMUSCULAR | Status: DC | PRN
  Administered 2014-02-17: 10 mg via INTRAVENOUS

## 2014-02-17 MED ORDER — OXYCODONE HCL 5 MG PO TABS: 5.0000 mg | ORAL_TABLET | Freq: Once | ORAL | Status: DC | PRN

## 2014-02-17 MED ORDER — MIDAZOLAM HCL 2 MG/2ML IJ SOLN
INTRAMUSCULAR | Status: AC
  Filled 2014-02-17: qty 2

## 2014-02-17 MED ORDER — FENTANYL CITRATE 0.05 MG/ML IJ SOLN
INTRAMUSCULAR | Status: AC
Start: 2014-02-17 — End: 2014-02-17
  Filled 2014-02-17: qty 5

## 2014-02-17 MED ORDER — PROMETHAZINE HCL 25 MG/ML IJ SOLN
6.2500 mg | INTRAMUSCULAR | Status: DC | PRN
  Administered 2014-02-17: 6.25 mg via INTRAVENOUS

## 2014-02-17 MED ORDER — HYDROMORPHONE HCL PF 1 MG/ML IJ SOLN: 0.2500 mg | INTRAMUSCULAR | Status: DC | PRN

## 2014-02-17 MED ORDER — HEPARIN SODIUM (PORCINE) 5000 UNIT/ML IJ SOLN
5000.0000 [IU] | Freq: Three times a day (TID) | INTRAMUSCULAR | Status: DC
  Administered 2014-02-17 – 2014-02-18 (×2): 5000 [IU] via SUBCUTANEOUS
  Filled 2014-02-17 (×5): qty 1

## 2014-02-17 MED ORDER — BUPIVACAINE LIPOSOME 1.3 % IJ SUSP
INTRAMUSCULAR | Status: DC | PRN
  Administered 2014-02-17: 20 mL

## 2014-02-17 MED ORDER — FENTANYL CITRATE 0.05 MG/ML IJ SOLN
INTRAMUSCULAR | Status: AC
  Filled 2014-02-17: qty 2

## 2014-02-17 MED ORDER — SODIUM CHLORIDE 0.9 % IJ SOLN
INTRAMUSCULAR | Status: DC | PRN
  Administered 2014-02-17: 20 mL via INTRAVENOUS

## 2014-02-17 MED ORDER — ACETAMINOPHEN 160 MG/5ML PO SOLN
650.0000 mg | ORAL | Status: DC | PRN
  Administered 2014-02-17 – 2014-02-18 (×5): 650 mg via ORAL
  Filled 2014-02-17 (×4): qty 20.3

## 2014-02-17 MED ORDER — MIDAZOLAM HCL 5 MG/5ML IJ SOLN
INTRAMUSCULAR | Status: DC | PRN
  Administered 2014-02-17: 2 mg via INTRAVENOUS

## 2014-02-17 MED ORDER — HYDROMORPHONE HCL PF 1 MG/ML IJ SOLN
0.2500 mg | INTRAMUSCULAR | Status: DC | PRN
  Administered 2014-02-17: 0.5 mg via INTRAVENOUS

## 2014-02-17 MED ORDER — PROMETHAZINE HCL 25 MG/ML IJ SOLN: 6.2500 mg | INTRAMUSCULAR | Status: DC | PRN

## 2014-02-17 MED ORDER — MEPERIDINE HCL 50 MG/ML IJ SOLN: 6.2500 mg | INTRAMUSCULAR | Status: DC | PRN

## 2014-02-17 MED ORDER — PROPOFOL 10 MG/ML IV BOLUS
INTRAVENOUS | Status: DC | PRN
  Administered 2014-02-17: 200 mg via INTRAVENOUS

## 2014-02-17 MED ORDER — OXYCODONE HCL 5 MG/5ML PO SOLN
5.0000 mg | Freq: Once | ORAL | Status: DC | PRN
  Filled 2014-02-17: qty 5

## 2014-02-17 MED ORDER — PROMETHAZINE HCL 25 MG/ML IJ SOLN
INTRAMUSCULAR | Status: AC
  Filled 2014-02-17: qty 1

## 2014-02-17 MED ORDER — BUPIVACAINE LIPOSOME 1.3 % IJ SUSP
20.0000 mL | Freq: Once | INTRAMUSCULAR | Status: DC
  Filled 2014-02-17: qty 20

## 2014-02-17 MED ORDER — UNJURY CHICKEN SOUP POWDER: 2.0000 [oz_av] | Freq: Four times a day (QID) | ORAL | Status: DC

## 2014-02-17 MED ORDER — HYDROMORPHONE HCL PF 1 MG/ML IJ SOLN
INTRAMUSCULAR | Status: AC
Start: 2014-02-17 — End: 2014-02-18
  Filled 2014-02-17: qty 1

## 2014-02-17 MED ORDER — NEOSTIGMINE METHYLSULFATE 10 MG/10ML IV SOLN
INTRAVENOUS | Status: DC | PRN
  Administered 2014-02-17: 4 mg via INTRAVENOUS

## 2014-02-17 MED ORDER — LIDOCAINE HCL (CARDIAC) 20 MG/ML IV SOLN
INTRAVENOUS | Status: DC | PRN
  Administered 2014-02-17: 50 mg via INTRAVENOUS

## 2014-02-17 MED ORDER — KCL IN DEXTROSE-NACL 20-5-0.45 MEQ/L-%-% IV SOLN
INTRAVENOUS | Status: DC
  Administered 2014-02-17 – 2014-02-18 (×2): via INTRAVENOUS
  Filled 2014-02-17 (×3): qty 1000

## 2014-02-17 MED ORDER — ONDANSETRON HCL 4 MG/2ML IJ SOLN
INTRAMUSCULAR | Status: AC
  Filled 2014-02-17: qty 2

## 2014-02-17 MED ORDER — LACTATED RINGERS IV SOLN
INTRAVENOUS | Status: DC | PRN
  Administered 2014-02-17: 10:00:00 via INTRAVENOUS

## 2014-02-17 MED ORDER — ACETAMINOPHEN 160 MG/5ML PO SOLN
325.0000 mg | ORAL | Status: DC | PRN
  Filled 2014-02-17: qty 20.3

## 2014-02-17 MED ORDER — HEPARIN SODIUM (PORCINE) 5000 UNIT/ML IJ SOLN
5000.0000 [IU] | INTRAMUSCULAR | Status: AC
  Administered 2014-02-17: 5000 [IU] via SUBCUTANEOUS
  Filled 2014-02-17: qty 1

## 2014-02-17 MED ORDER — CEFOXITIN SODIUM 2 G IV SOLR
2.0000 g | INTRAVENOUS | Status: AC
  Administered 2014-02-17: 2 g via INTRAVENOUS

## 2014-02-17 MED ORDER — ROCURONIUM BROMIDE 100 MG/10ML IV SOLN
INTRAVENOUS | Status: AC
  Filled 2014-02-17: qty 1

## 2014-02-17 MED ORDER — 0.9 % SODIUM CHLORIDE (POUR BTL) OPTIME
TOPICAL | Status: DC | PRN
  Administered 2014-02-17: 1000 mL

## 2014-02-17 MED ORDER — LACTATED RINGERS IV SOLN
INTRAVENOUS | Status: DC
  Administered 2014-02-17 (×2): 1000 mL via INTRAVENOUS

## 2014-02-17 SURGICAL SUPPLY — 54 items
ADH SKN CLS APL DERMABOND .7 (GAUZE/BANDAGES/DRESSINGS) ×1
APL SKNCLS STERI-STRIP NONHPOA (GAUZE/BANDAGES/DRESSINGS)
BAND LAP 10.0 W/TUBES (Band) ×2 IMPLANT
BENZOIN TINCTURE PRP APPL 2/3 (GAUZE/BANDAGES/DRESSINGS) IMPLANT
BLADE SURG 15 STRL LF DISP TIS (BLADE) ×1 IMPLANT
BLADE SURG 15 STRL SS (BLADE) ×2
DECANTER SPIKE VIAL GLASS SM (MISCELLANEOUS) IMPLANT
DERMABOND ADVANCED (GAUZE/BANDAGES/DRESSINGS) ×1
DERMABOND ADVANCED .7 DNX12 (GAUZE/BANDAGES/DRESSINGS) ×1 IMPLANT
DEVICE SUT QUICK LOAD TK 5 (STAPLE) ×6 IMPLANT
DEVICE SUT TI-KNOT TK 5X26 (MISCELLANEOUS) ×2 IMPLANT
DEVICE SUTURE ENDOST 10MM (ENDOMECHANICALS) IMPLANT
DISSECTOR BLUNT TIP ENDO 5MM (MISCELLANEOUS) IMPLANT
DRAPE CAMERA CLOSED 9X96 (DRAPES) ×2 IMPLANT
ELECT REM PT RETURN 9FT ADLT (ELECTROSURGICAL) ×2
ELECTRODE REM PT RTRN 9FT ADLT (ELECTROSURGICAL) ×1 IMPLANT
GAUZE SPONGE 4X4 12PLY STRL (GAUZE/BANDAGES/DRESSINGS) IMPLANT
GLOVE BIOGEL M 8.0 STRL (GLOVE) ×2 IMPLANT
GOWN SPEC L4 XLG W/TWL (GOWN DISPOSABLE) IMPLANT
GOWN STRL REUS W/TWL XL LVL3 (GOWN DISPOSABLE) ×10 IMPLANT
HOVERMATT SINGLE USE (MISCELLANEOUS) ×2 IMPLANT
KIT BASIN OR (CUSTOM PROCEDURE TRAY) ×2 IMPLANT
MESH HERNIA 1X4 RECT BARD (Mesh General) ×1 IMPLANT
MESH HERNIA BARD 1X4 (Mesh General) ×1 IMPLANT
NEEDLE SPNL 22GX3.5 QUINCKE BK (NEEDLE) ×2 IMPLANT
PACK UNIVERSAL I (CUSTOM PROCEDURE TRAY) ×2 IMPLANT
SCISSORS LAP 5X45 EPIX DISP (ENDOMECHANICALS) ×1 IMPLANT
SET IRRIG TUBING LAPAROSCOPIC (IRRIGATION / IRRIGATOR) IMPLANT
SHEARS CURVED HARMONIC AC 45CM (MISCELLANEOUS) IMPLANT
SLEEVE ADV FIXATION 5X100MM (TROCAR) IMPLANT
SLEEVE ENDOPATH XCEL 5M (ENDOMECHANICALS) ×4 IMPLANT
SOLUTION ANTI FOG 6CC (MISCELLANEOUS) ×2 IMPLANT
SPONGE LAP 18X18 X RAY DECT (DISPOSABLE) ×2 IMPLANT
STAPLER VISISTAT 35W (STAPLE) ×2 IMPLANT
STRIP CLOSURE SKIN 1/2X4 (GAUZE/BANDAGES/DRESSINGS) IMPLANT
SUT ETHIBOND 2 0 SH (SUTURE) ×6
SUT ETHIBOND 2 0 SH 36X2 (SUTURE) ×3 IMPLANT
SUT PROLENE 2 0 CT2 30 (SUTURE) ×2 IMPLANT
SUT SILK 0 (SUTURE) ×2
SUT SILK 0 30XBRD TIE 6 (SUTURE) ×1 IMPLANT
SUT SURGIDAC NAB ES-9 0 48 120 (SUTURE) IMPLANT
SUT VIC AB 2-0 SH 27 (SUTURE)
SUT VIC AB 2-0 SH 27X BRD (SUTURE) IMPLANT
SUT VIC AB 4-0 SH 18 (SUTURE) ×2 IMPLANT
SYRINGE 20CC LL (MISCELLANEOUS) ×4 IMPLANT
TOWEL OR 17X26 10 PK STRL BLUE (TOWEL DISPOSABLE) ×4 IMPLANT
TOWEL OR NON WOVEN STRL DISP B (DISPOSABLE) ×2 IMPLANT
TROCAR ADV FIXATION 12X100MM (TROCAR) ×2 IMPLANT
TROCAR BLADELESS 15MM (ENDOMECHANICALS) ×2 IMPLANT
TROCAR BLADELESS OPT 5 100 (ENDOMECHANICALS) ×2 IMPLANT
TROCAR XCEL NON-BLD 11X100MML (ENDOMECHANICALS) IMPLANT
TROCAR XCEL UNIV SLVE 11M 100M (ENDOMECHANICALS) IMPLANT
TUBE CALIBRATION LAPBAND (TUBING) ×2 IMPLANT
TUBING INSUFFLATION 10FT LAP (TUBING) ×2 IMPLANT

## 2014-02-17 NOTE — Anesthesia Preprocedure Evaluation (Addendum)
Anesthesia Evaluation  Patient identified by MRN, date of birth, ID band Patient awake    Reviewed: Allergy & Precautions, H&P , NPO status , Patient's Chart, lab work & pertinent test results  Airway Mallampati: II TM Distance: >3 FB Neck ROM: Full    Dental no notable dental hx.    Pulmonary neg pulmonary ROS,  breath sounds clear to auscultation  Pulmonary exam normal       Cardiovascular negative cardio ROS  Rhythm:Regular Rate:Normal     Neuro/Psych negative neurological ROS  negative psych ROS   GI/Hepatic negative GI ROS, Neg liver ROS,   Endo/Other  Morbid obesity  Renal/GU negative Renal ROS     Musculoskeletal negative musculoskeletal ROS (+)   Abdominal (+) + obese,   Peds  Hematology negative hematology ROS (+)   Anesthesia Other Findings   Reproductive/Obstetrics negative OB ROS                          Anesthesia Physical Anesthesia Plan  ASA: III  Anesthesia Plan: General   Post-op Pain Management:    Induction: Intravenous  Airway Management Planned:   Additional Equipment:   Intra-op Plan:   Post-operative Plan: Extubation in OR  Informed Consent: I have reviewed the patients History and Physical, chart, labs and discussed the procedure including the risks, benefits and alternatives for the proposed anesthesia with the patient or authorized representative who has indicated his/her understanding and acceptance.   Dental advisory given  Plan Discussed with: CRNA  Anesthesia Plan Comments:         Anesthesia Quick Evaluation

## 2014-02-17 NOTE — Op Note (Signed)
02/17/2014  Surgeon: Kaylyn Lim, MD, FACS Asst:  Greer Pickerel, MD, FACS  Procedure: Laparoscopic adjustable gastric banding with APS system  Anes:  General  EBL:  Minimal  Description of Procedure  The patient was taken to OR # 1 and given general anesthesia.  After a prep with PCMX the patient was draped and a timeout performed.  Access to the abdomen was achieved with a 0 degree Optiview technique through the left upper quadrant.    Adhesions were nonexistent.  Ports were placed to the the right of the midline including a 15 trocar in  the right upper quadrant placed obliquely.  The Sherrie Sport was used to retract the left lateral segment and the peritoneum was incised along the left crus.   The EJ junction as assessed for a hiatus hernia and no dimple was seen.  A balloon test was negative.    The pars flaccida technique was utilized to insert the blunt "finger " dissector from right to left behind the stomach.  This created a target zone to pass the band passer.     The lapband APS  Had been previously flushed and was inserted through the 15 trocar.  It was placed in the tip of "the finger"  and pulled around behind the stomach.   The band was plicated with 3 free Surgidec 2-0.   The tubing was brought out through the lower incision on the right and connected to the port which had mesh sewn onto the back and was placed into the subcutaneous pocket.  The incisions were injected with Exparel and closed with 4-0 vicryl and Dermabond.     The patient was taken to the PACU in stable condition.    Matt B. Hassell Done, [place], [facility], [place]

## 2014-02-17 NOTE — Interval H&P Note (Signed)
History and Physical Interval Note:  02/17/2014 9:59 AM  Mary Morrison  has presented today for surgery, with the diagnosis of Morbid Obesity  The various methods of treatment have been discussed with the patient and family. After consideration of risks, benefits and other options for treatment, the patient has consented to  Procedure(s): LAPAROSCOPIC GASTRIC BANDING (N/A) as a surgical intervention .  The patient's history has been reviewed, patient examined, no change in status, stable for surgery.  I have reviewed the patient's chart and labs.  Questions were answered to the patient's satisfaction.     , B

## 2014-02-17 NOTE — Anesthesia Postprocedure Evaluation (Signed)
Anesthesia Post Note  Patient: Mary Morrison  Procedure(s) Performed: Procedure(s) (LRB): LAPAROSCOPIC GASTRIC BANDING (N/A)  Anesthesia type: General  Patient location: PACU  Post pain: Pain level controlled  Post assessment: Post-op Vital signs reviewed  Last Vitals: BP 136/79  Pulse 77  Temp(Src) 36.5 C (Oral)  Resp 14  Ht 5\' 5"  (1.651 m)  Wt 247 lb 6 oz (112.209 kg)  BMI 41.17 kg/m2  SpO2 95%  LMP 02/10/2014  Post vital signs: Reviewed  Level of consciousness: sedated  Complications: No apparent anesthesia complications

## 2014-02-17 NOTE — H&P (View-Only) (Signed)
Subjective:   obesity, joint pain  Patient ID: Mary Morrison, female   DOB: 1966/05/14, 48 y.o.   MRN: [id number]  HPI Mary Morrison y.o.female presents for consideration for surgical treatment for morbid obesity.  She  gives a history of progressive obesity since adolescence despite multiple attempts at medical management.  Her weight has been affecting her in a number of ways including difficulty performing routine activities of daily living and participating in activities with friends. She has begun to see some deterioration in her health related to her weight which concerned her. She has knee pain which limits her physical activity. She has some moderate reflux requiring over-the-counter medications. She has noted some intermittent stress incontinence. She also has significant lower extremity edema which has worsened as her weight has increased.   She has been to our initial information seminar, researched surgical options thoroughly and is interested in lap band. She has completed the requisite workup including insightful discussions with Dr. Ardath Sax.  She has switched from Dr. Excell Seltzer to me because of the [employer] directive.    Past Medical History  Diagnosis Date  . Obesity    Past Surgical History  Procedure Laterality Date  . Tonsillectomy    . Cesarean section  1989   Current Outpatient Prescriptions  Medication Sig Dispense Refill  . human chorionic gonadotropin (PREGNYL/NOVAREL) 10000 UNITS injection Inject 10,000 Units into the muscle once.      Marland Kitchen MAGNESIUM CITRATE PO Take by mouth.      . Probiotic Product (PROBIOTIC DAILY PO) Take by mouth.       No current facility-administered medications for this visit.   No Known Allergies History  Substance Use Topics  . Smoking status: Never Smoker   . Smokeless tobacco: Not on file  . Alcohol Use: No       Review of Systems  Constitutional: Positive for fatigue.  Respiratory: Positive for shortness of breath.  Negative for cough, choking and stridor.        Mild shortness of breath on exertion  Cardiovascular: Negative.   Gastrointestinal: Positive for blood in stool. Negative for abdominal pain.       Moderate GERD  Genitourinary:       Positive for stress incontinence  Musculoskeletal: Positive for arthralgias.       Objective:   Physical Exam BP 128/84  Pulse 66  Temp(Src) 97.3 F (36.3 C) (Temporal)  Resp 18  Ht 5\' 6"  (1.676 m)  Wt 249 lb 6.4 oz (113.127 kg)  BMI 40.27 kg/m2  General: Alert, Obese Caucasian female, in no distress Skin: Warm and dry without rash or infection. HEENT: No palpable masses or thyromegaly. Sclera nonicteric. Pupils equal round and reactive. Oropharynx clear. Lymph nodes: No cervical, supraclavicular, or inguinal nodes palpable. Lungs: Breath sounds clear and equal without increased work of breathing Cardiovascular: Regular rate and rhythm without murmur. No JVD or edema. Peripheral pulses intact. Abdomen: Nondistended. Soft and nontender. No masses palpable. No organomegaly. No palpable hernias. Extremities: 1-2+ lower extremity edema bilaterally. no joint swelling or deformity. No chronic venous stasis changes. Neurologic: Alert and fully oriented. Gait normal.    Assessment:     Patient with progressive morbid obesity unresponsive to multiple efforts at medical management who presents with a BMI of 39 and comorbidities of lower extremity edema, GERD, joint pain and urinary stress incontinence.. I believe there would be very significant medical benefit from surgical weight loss. After our discussion of surgical options currently available the patient has  decided to proceed with LAP-BAND placement due to the reasons above. We have discussed the nature of morbid obesity and the risk of remaining obese. We discussed the indications for the procedure, its nature, and expected recovery. The risks of the procedure were discussed in detail including anesthetic  complications, bleeding, infection, visceral injury, dysphagia, and long-term risks of mechanical failure, slippage, erosion, esophageal dilatation and failure to lose weight. Rare risk of death was discussed. The patient was given a complete consent form and all questions were answered. She has undergone UGI (negative) and is ready to schedule Lapband placement.  I reviewed the procedure with her in some detail and took her to [employer] office for scheduling.          Plan:     Placement of Winnie Hassell Done, MD, [facility] [phone] beeper [phone]  01/20/2014 4:41 PM

## 2014-02-17 NOTE — Transfer of Care (Signed)
Immediate Anesthesia Transfer of Care Note  Patient: Mary Morrison  Procedure(s) Performed: Procedure(s): LAPAROSCOPIC GASTRIC BANDING (N/A)  Patient Location: PACU  Anesthesia Type:General  Level of Consciousness: awake, sedated and patient cooperative  Airway & Oxygen Therapy: Patient Spontanous Breathing and Patient connected to face mask oxygen  Post-op Assessment: Report given to PACU RN and Post -op Vital signs reviewed and stable  Post vital signs: Reviewed and stable  Complications: No apparent anesthesia complications

## 2014-02-17 NOTE — Discharge Instructions (Signed)
Laparoscopic Gastric Band Surgery, Care After These instructions give you information on caring for yourself after your procedure. Your doctor may also give you more specific instructions. Call your doctor if you have any problems or questions after your procedure. HOME CARE   Take walks throughout the day. Do not sit for longer than 1 hour while awake for 4 to 6 weeks.  You may shower 2 days after surgery. Pat the surgery cuts (incisions) dry. Do not rub the surgery cuts.  Do your coughing and deep breathing exercises.  Do not lift, push, or pull anything heavy until your doctor says it is okay.  Only take medicines as told by your doctor. Do not drive while taking pain medicine.  Drink plenty of fluids to keep your pee (urine) clear or pale yellow.  Stay on a liquid diet as long as your doctor tells you to.  Do not drink caffeine for 1 month.  Change bandages (dressings) as told by your doctor.  Check your surgery cuts for redness, puffiness (swelling), abnormal coloring, fluid, or bleeding.  Follow your doctor's advice about vitamin and protein needs after surgery. GET HELP RIGHT AWAY IF:  You feel sick to your stomach (nauseous) and throw up (vomit).  You have pain and discomfort with swallowing.  You develop shortness of breath or difficulty breathing.  You have pain, puffiness, or feel warmth on your lower body.  You have very bad calf pain or pain not relieved by medicine.  You have a temperature by mouth above 102 F (38.9 C).  Your surgery cuts look red, puffy, or they leak fluid.  Your poop (stool) is black, tarry, or dark red.  You have chills.  You have chest pain.  You feel confused.  You have slurred speech.  You feel light-headed when standing.  You suddenly feel weak.  You have any questions or concerns. MAKE SURE YOU:   Understand these instructions.  Will watch your condition.  Will get help right away if you are not doing well or get  worse. Document Released: 08/12/2010 Document Revised: 11/24/2013 Document Reviewed: 08/12/2010 [facility] Patient Information 2015 [place], [place]. This information is not intended to replace advice given to you by your health care provider. Make sure you discuss any questions you have with your health care provider.

## 2014-02-18 ENCOUNTER — Encounter (HOSPITAL_COMMUNITY): Payer: Self-pay | Admitting: Surgery

## 2014-02-18 ENCOUNTER — Observation Stay (HOSPITAL_COMMUNITY): Payer: 59

## 2014-02-18 LAB — CBC WITH DIFFERENTIAL/PLATELET
Basophils Absolute: 0 10*3/uL (ref 0.0–0.1)
Basophils Relative: 0 % (ref 0–1)
Eosinophils Absolute: 0 10*3/uL (ref 0.0–0.7)
Eosinophils Relative: 0 % (ref 0–5)
HCT: 33.7 % — ABNORMAL LOW (ref 36.0–46.0)
Hemoglobin: 10.6 g/dL — ABNORMAL LOW (ref 12.0–15.0)
Lymphocytes Relative: 17 % (ref 12–46)
Lymphs Abs: 1 10*3/uL (ref 0.7–4.0)
MCH: 28.7 pg (ref 26.0–34.0)
MCHC: 31.5 g/dL (ref 30.0–36.0)
MCV: 91.3 fL (ref 78.0–100.0)
Monocytes Absolute: 0.7 10*3/uL (ref 0.1–1.0)
Monocytes Relative: 11 % (ref 3–12)
Neutro Abs: 4.3 10*3/uL (ref 1.7–7.7)
Neutrophils Relative %: 72 % (ref 43–77)
Platelets: 213 10*3/uL (ref 150–400)
RBC: 3.69 MIL/uL — ABNORMAL LOW (ref 3.87–5.11)
RDW: 14.7 % (ref 11.5–15.5)
WBC: 6.1 10*3/uL (ref 4.0–10.5)

## 2014-02-18 NOTE — Discharge Summary (Signed)
Physician Discharge Summary  Patient ID: Mary Morrison MRN: [id number] DOB/AGE: July 23, 1966 48 y.o.  Admit date: 02/17/2014 Discharge date: 02/18/2014  Admission Diagnoses:  Morbid obesity  Discharge Diagnoses:  same  Active Problems:   Morbid obesity   Lapband APS July 2015   Surgery:  lapband APS  Discharged Condition: improved   Hospital Course:   Had lapband surgery.  Postop xray the band was at 2-8 position and looked good.  Ready for discharge on PD 1  Consults: none  Significant Diagnostic Studies: xray    Discharge Exam: Blood pressure 113/67, pulse 63, temperature 98.1 F (36.7 C), temperature source Oral, resp. rate 16, height 5\' 5"  (1.651 m), weight 247 lb 6 oz (112.209 kg), last menstrual period 02/10/2014, SpO2 92.00%. Incisions OK.  Minimal pain  Disposition: Final discharge disposition not confirmed  Discharge Instructions   Discharge instructions    Complete by:  As directed   Follow bariatric dietary guidelines.  You may feel restriction initially before your first bandfill that is due to postop swelling which will resolve before your first fill in 5-6 weeks.  In the meantime stay on the diet as prescribed by dietary.     Increase activity slowly    Complete by:  As directed      No wound care    Complete by:  As directed             Medication List         loratadine 10 MG tablet  Commonly known as:  CLARITIN  Take 10 mg by mouth daily.     MAGNESIUM CITRATE PO  Take 2 tablets by mouth at bedtime.     OVER THE COUNTER MEDICATION  Place 1 tablet under the tongue daily. Human chorionic gonadotropin 500 units     OVER THE COUNTER MEDICATION  1 tablet. Dr. schulze's intestinal no. 1     VITAMIN B-6 CR PO  Take 1 tablet by mouth daily.           Follow-up Information   Follow up with Johnathan Hausen B, MD. Schedule an appointment as soon as possible for a visit in 2 weeks.   Specialty:  General Surgery   Contact information:   [address] [place] [address] [phone]       Signed: Pedro Earls 02/18/2014, 8:46 AM

## 2014-02-18 NOTE — Progress Notes (Signed)
Nutrition Education Note  Received referral for diet education per DROP protocol.   Discussed 2 week post op diet with pt. Emphasized that liquids must be non carbonated, non caffeinated, and sugar free. Fluid goals discussed. Pt to follow up with outpatient bariatric RD for further diet progression after 2 weeks. Multivitamins and minerals also reviewed. Teach back method used, pt expressed understanding, expect good compliance.   Diet: First 2 Weeks  You will see the nutritionist about two (2) weeks after your surgery. The nutritionist will increase the types of foods you can eat if you are handling liquids well:  If you have severe vomiting or nausea and cannot handle clear liquids lasting longer than 1 day, call your surgeon  Protein Shake  Drink at least 2 ounces of shake 5-6 times per day  Each serving of protein shakes (usually 8 - 12 ounces) should have a minimum of:  15 grams of protein  And no more than 5 grams of carbohydrate  Goal for protein each day:  Men = 80 grams per day  Women = 60 grams per day  Protein powder may be added to fluids such as non-fat milk or Lactaid milk or Soy milk (limit to 35 grams added protein powder per serving)   Hydration  Slowly increase the amount of water and other clear liquids as tolerated (See Acceptable Fluids)  Slowly increase the amount of protein shake as tolerated  Sip fluids slowly and throughout the day  May use sugar substitutes in small amounts (no more than 6 - 8 packets per day; i.e. Splenda)   Fluid Goal  The first goal is to drink at least 8 ounces of protein shake/drink per day (or as directed by the nutritionist); some examples of protein shakes are [employer], [employer], EAS Edge HP, and Unjury. See handout from pre-op Bariatric Education Class:  Slowly increase the amount of protein shake you drink as tolerated  You may find it easier to slowly sip shakes throughout the day  It is important to get your proteins  in first  Your fluid goal is to drink 64 - 100 ounces of fluid daily  It may take a few weeks to build up to this  32 oz (or more) should be clear liquids  And  32 oz (or more) should be full liquids (see below for examples)  Liquids should not contain sugar, caffeine, or carbonation   Clear Liquids:  Water or Sugar-free flavored water (i.e. Fruit H2O, Propel)  Decaffeinated coffee or tea (sugar-free)  Crystal Lite, Wyler's Lite, Minute Maid Lite  Sugar-free Jell-O  Bouillon or broth  Sugar-free Popsicle: *Less than 20 calories each; Limit 1 per day   Full Liquids:  Protein Shakes/Drinks + 2 choices per day of other full liquids  Full liquids must be:  No More Than 12 grams of Carbs per serving  No More Than 3 grams of Fat per serving  Strained low-fat cream soup  Non-Fat milk  Fat-free Lactaid Milk  Sugar-free yogurt (Dannon Lite & Fit, [place] yogurt)     [place] MS, RD, [place] [phone] Pager [phone] Weekend/After Hours Pager

## 2014-02-18 NOTE — Progress Notes (Signed)
Utilization Review Completed.,  T7/29/2015  

## 2014-02-18 NOTE — Progress Notes (Signed)
Patient is hemodynamically stable, operative site with no drainage or signs of infection.  Pain controlled, bariatric diet tolerated and activity with normal limit.  Appropriate for discharge.  Discharge instruction given to patient and family.  verbalized understanding with teach back and follow up with doctor.   Patient discharged in wheelchair with family to home. Cyndia Diver, RN 02/18/2014 1120

## 2014-02-18 NOTE — Progress Notes (Signed)
Patient alert and oriented, pain is controlled. Patient is tolerating fluids, plan to advance to protein shake today. Provided and reviewed Gastric Bypass discharge instructions with patient and patient using teach back method. Provided information on BELT program, Support Group and [facility] outpatient pharmacy. All questions answered. Tolerating protein shake. Ready for discharge home.  GASTRIC BYPASS / [place] Instructions  These instructions are to help you care for yourself when you go home.  Call: If you have any problems.   Call [phone] and ask for the surgeon on call   If you need immediate assistance come to the ER at [facility]. Tell the ER staff that you are a new post-op gastric bypass or gastric sleeve patient   Signs and symptoms to report:   Severe vomiting or nausea o If you cannot handle clear liquids for longer than 1 day, call your surgeon    Abdominal pain which does not get better after taking your pain medication   Fever greater than 100.4 F and chills   Heart rate over 100 beats a minute   Trouble breathing   Chest pain    Redness, swelling, drainage, or foul odor at incision (surgical) sites    If your incisions open or pull apart   Swelling or pain in calf (lower leg)   Diarrhea (Loose bowel movements that happen often), frequent watery, uncontrolled bowel movements   Constipation, (no bowel movements for 3 days) if this happens:  o Take Milk of Magnesia, 2 tablespoons by mouth, 3 times a day for 2 days if needed o Stop taking Milk of Magnesia once you have had a bowel movement o Call your doctor if constipation continues Or o Take Miralax  (instead of Milk of Magnesia) following the label instructions o Stop taking Miralax once you have had a bowel movement o Call your doctor if constipation continues   Anything you think is "abnormal for you"   Normal side effects after surgery:   Unable to sleep at night or unable to concentrate   Irritability    Being tearful (crying) or depressed These are common complaints, possibly related to your anesthesia, stress of surgery and change in lifestyle, that usually go away a few weeks after surgery.  If these feelings continue, call your medical doctor.  Wound Care: You may have surgical glue, steri-strips, or staples over your incisions after surgery   Surgical glue:  Looks like a clear film over your incisions and will wear off a little at a time   Steri-strips : Adhesive strips of tape over your incisions. You may notice a yellowish color on the skin under the steri-strips. This is used to make the   steri-strips stick better. Do not pull the steri-strips off - let them fall off   Staples: Staples may be removed before you leave the hospital o If you go home with staples, call [facility] at for an appointment with your surgeon's nurse to have staples removed 10 days after surgery, [phone]   Showering: You may shower two (2) days after your surgery unless your surgeon tells you differently o Wash gently around incisions with warm soapy water, rinse well, and gently pat dry  o If you have a drain (tube from your incision), you may need someone to hold this while you shower  o No tub baths until staples are removed and incisions are healed     Medications:   Medications should be liquid or crushed if  larger than the size of a dime   Extended release pills (medication that releases a little bit at a time through the day) should not be crushed   Depending on the size and number of medications you take, you may need to space (take a few throughout the day)/change the time you take your medications so that you do not over-fill your pouch (smaller stomach)   Make sure you follow-up with your primary care physician to make medication changes needed during rapid weight loss and life-style changes   If you have diabetes, follow up with the doctor that orders your diabetes medication(s) within  one week after surgery and check your blood sugar regularly.   Do not drive while taking narcotics (pain medications)   Do not take acetaminophen (Tylenol) and Roxicet or Lortab Elixir at the same time since these pain medications contain acetaminophen  Diet:                    First 2 Weeks  You will see the nutritionist about two (2) weeks after your surgery. The nutritionist will increase the types of foods you can eat if you are handling liquids well:   If you have severe vomiting or nausea and cannot handle clear liquids lasting longer than 1 day, call your surgeon  Protein Shake   Drink at least 2 ounces of shake 5-6 times per day   Each serving of protein shakes (usually 8 - 12 ounces) should have a minimum of:  o 15 grams of protein  o And no more than 5 grams of carbohydrate    Goal for protein each day: o Men = 80 grams per day o Women = 60 grams per day   Protein powder may be added to fluids such as non-fat milk or Lactaid milk or Soy milk (limit to 35 grams added protein powder per serving)  Hydration   Slowly increase the amount of water and other clear liquids as tolerated (See Acceptable Fluids)   Slowly increase the amount of protein shake as tolerated     Sip fluids slowly and throughout the day   May use sugar substitutes in small amounts (no more than 6 - 8 packets per day; i.e. Splenda)  Fluid Goal   The first goal is to drink at least 8 ounces of protein shake/drink per day (or as directed by the nutritionist); some examples of protein shakes are [employer], [employer], EAS Edge HP, and Unjury. See handout from pre-op Bariatric Education Class: o Slowly increase the amount of protein shake you drink as tolerated o You may find it easier to slowly sip shakes throughout the day o It is important to get your proteins in first   Your fluid goal is to drink 64 - 100 ounces of fluid daily o It may take a few weeks to build up to this   32 oz (or more) should be  clear liquids  And    32 oz (or more) should be full liquids (see below for examples)   Liquids should not contain sugar, caffeine, or carbonation  Clear Liquids:   Water or Sugar-free flavored water (i.e. Fruit H2O, Propel)   Decaffeinated coffee or tea (sugar-free)   Crystal Lite, Wyler's Lite, Minute Maid Lite   Sugar-free Jell-O   Bouillon or broth   Sugar-free Popsicle:   *Less than 20 calories each; Limit 1 per day  Full Liquids: Protein Shakes/Drinks + 2 choices per day of other  full liquids   Full liquids must be: o No More Than 12 grams of Carbs per serving  o No More Than 3 grams of Fat per serving   Strained low-fat cream soup   Non-Fat milk   Fat-free Lactaid Milk   Sugar-free yogurt (Dannon Lite & Fit, Greek yogurt)      Vitamins and Minerals   Start 1 day after surgery unless otherwise directed by your surgeon   2 Chewable Multivitamin / Multimineral Supplement with iron (i.e. Centrum for Adults)   Vitamin B-12, 350 - 500 micrograms sub-lingual (place tablet under the tongue) each day   Chewable Calcium Citrate with Vitamin D-3 (Example: 3 Chewable Calcium Plus 600 with Vitamin D-3) o Take 500 mg three (3) times a day for a total of 1500 mg each day o Do not take all 3 doses of calcium at one time as it may cause constipation, and you can only absorb 500 mg  at a time  o Do not mix multivitamins containing iron with calcium supplements; take 2 hours apart o Do not substitute Tums (calcium carbonate) for your calcium   Menstruating women and those at risk for anemia (a blood disease that causes weakness) may need extra iron o Talk with your doctor to see if you need more iron   If you need extra iron: Total daily Iron recommendation (including Vitamins) is 50 to 100 mg Iron/day   Do not stop taking or change any vitamins or minerals until you talk to your nutritionist or surgeon   Your nutritionist and/or surgeon must approve all vitamin and mineral supplements    Activity and Exercise: It is important to continue walking at home.  Limit your physical activity as instructed by your doctor.  During this time, use these guidelines:   Do not lift anything greater than ten (10) pounds for at least two (2) weeks   Do not go back to work or drive until Engineer, production says you can   You may have sex when you feel comfortable  o It is VERY important for female patients to use a reliable birth control method; fertility often increases after surgery  o Do not get pregnant for at least 18 months   Start exercising as soon as your doctor tells you that you can o Make sure your doctor approves any physical activity   Start with a simple walking program   Walk 5-15 minutes each day, 7 days per week.    Slowly increase until you are walking 30-45 minutes per day Consider joining our [place] program. [phone] or email [email or web address]   Special Instructions Things to remember:   Free counseling is available for you and your family through collaboration between [facility] and [place]. Please call [phone] and leave a message   Use your CPAP when sleeping if this applies to you   [facility] has a free Bariatric Surgery Support Group that meets monthly, the 3rd Thursday, 6 pm, [place] can see classes online at [email or web address]   It is very important to keep all follow up appointments with your surgeon, nutritionist, primary care physician, and behavioral health practitioner o After the first year, please follow up with your bariatric surgeon and nutritionist at least once a year in order to maintain best weight loss results [facility]: [employer]: [phone] Bariatric Nurse Coordinator: [phone]

## 2014-02-19 ENCOUNTER — Telehealth (HOSPITAL_COMMUNITY): Payer: Self-pay | Admitting: *Deleted

## 2014-02-19 NOTE — Telephone Encounter (Signed)
Made discharge phone call to patient per DROP protocol. Asking the following questions.    1. Do you have someone to care for you now that you are home?  Yes. 2. Are you having pain now that is not relieved by your pain medication?  No. Patient only on Tylenol, it is getting better. 3. Are you able to drink the recommended daily amount of fluids (48 ounces minimum/day) and protein (60-80 grams/day) as prescribed by the dietitian or nutritional counselor?  Working on increasing fluids and protein. Encouraged patient to sip on the fluids and protein shakes throughout the day. 4. Are you taking the vitamins and minerals as prescribed?  All except Iron. Patient to purchase. 5. Do you have the "on call" number to contact your surgeon if you have a problem or question?  Yes. 6. Are your incisions free of redness, swelling or drainage? (If steri strips, address that these can fall off, shower as tolerated)  7. Have your bowels moved since your surgery? No.  If not, are you passing gas?  Yes. 8. Are you up and walking 3-4 times per day?  Yes. 9. Do you have an appointment to see a dietitian or nutritional counselor in the next month?  Yes.  The following questions can be added at the center's discharge phone call script at the center's discretion.  The questions are also captured within [employer] project custom fields. 1. Do you have an appointment made to see your surgeon in the next month?  Yes. 2. Were you provided your discharge medications before your surgery or before you were discharged from the hospital and are you taking them without problem?  Yes. 3. Were you provided phone numbers to the clinic/surgeon's office?  Yes. 4. Did you watch the patient education video module in the (clinic, surgeon's office, etc.) before your surgery? Yes. 5. Do you have a discharge checklist that was provided to you in the hospital to reference with instructions on how to take care of yourself after surgery?   Yes. 6. Did you see a dietitian or nutritional counselor while you were in the hospital?  Yes.

## 2014-03-03 ENCOUNTER — Encounter: Payer: 59 | Attending: Surgery

## 2014-03-03 DIAGNOSIS — Z713 Dietary counseling and surveillance: Secondary | ICD-10-CM | POA: Insufficient documentation

## 2014-03-03 DIAGNOSIS — Z6839 Body mass index (BMI) 39.0-39.9, adult: Secondary | ICD-10-CM | POA: Insufficient documentation

## 2014-03-03 NOTE — Patient Instructions (Signed)
Patient to follow Phase 3A-Soft, High Protein Diet and follow-up at [facility] in 6 weeks for 2 months post-op nutrition visit for diet advancement. 

## 2014-03-03 NOTE — Progress Notes (Signed)
Bariatric Class:  Appt start time: 1530 end time:  1630.  2 Week Post-Operative Nutrition Class  Patient was seen on 03/03/14 for Post-[facility]ative [facility]   Surgery date: 02/17/2014 Surgery type: LAGB Start weight at [facility]: 247 lbs on 10/09/13 Weight today: 238.0 lbs Weight change: 9.5 lbs   TANITA  BODY COMP RESULTS  02/02/14 03/03/14   BMI (kg/m^2) 39.9 38.4   Fat Mass (lbs) 109 101.5   Fat Free Mass (lbs) 138.5 136.5   Total Body Water (lbs) 101.5 100.0    The following the learning objectives were met by the patient during this course:  Identifies Phase 3A (Soft, High Proteins) Dietary Goals and will begin from 2 weeks post-operatively to 2 months post-operatively  Identifies appropriate sources of fluids and proteins   States protein recommendations and appropriate sources post-operatively  Identifies the need for appropriate texture modifications, mastication, and bite sizes when consuming solids  Identifies appropriate multivitamin and calcium sources post-operatively  Describes the need for physical activity post-operatively and will follow MD recommendations  States when to call healthcare provider regarding medication questions or post-operative complications  Handouts given during class include:  Phase 3A: Soft, High Protein Diet Handout  Follow-Up Plan: Patient will follow-up at [facility] in 4 weeks for 2 month post-op nutrition visit for diet advancement per MD.

## 2014-03-06 ENCOUNTER — Ambulatory Visit (INDEPENDENT_AMBULATORY_CARE_PROVIDER_SITE_OTHER): Payer: 59 | Admitting: Surgery

## 2014-03-06 ENCOUNTER — Encounter (INDEPENDENT_AMBULATORY_CARE_PROVIDER_SITE_OTHER): Payer: Self-pay | Admitting: Surgery

## 2014-03-06 VITALS — BP 134/78 | HR 80 | Ht 66.0 in | Wt 238.8 lb

## 2014-03-06 DIAGNOSIS — Z4651 Encounter for fitting and adjustment of gastric lap band: Secondary | ICD-10-CM

## 2014-03-06 NOTE — Progress Notes (Signed)
Lapband Fill Encounter Problem List:   Patient Active Problem List   Diagnosis Date Noted  . Lapband APS July 2015 02/17/2014  . Edema-chronic pitting in legs 01/20/2014  . Morbid obesity 08/28/2013    Mary Morrison Body mass index is 38.56 kg/(m^2). Weight loss since surgery  10.6 lbs in the first 17 days post lapband  Having regurgitation?:  no  Feel that they need a fill?  Yes-hungry  Nocturnal reflux?  no  Amount of fill  0.5     Instructions given and weight loss goals discussed.    She was feeling not restricted after about a week.  Was interested in a fill and I went ahead and gave her a .5 cc fill.  Will see again in about 3 weeks after she goes on a cruise.     Matt B. Hassell Done, MD, FACS

## 2014-03-06 NOTE — Patient Instructions (Signed)

## 2014-03-10 ENCOUNTER — Ambulatory Visit: Payer: 59

## 2014-04-07 ENCOUNTER — Encounter: Payer: 59 | Attending: Surgery | Admitting: Dietician

## 2014-04-07 DIAGNOSIS — Z6839 Body mass index (BMI) 39.0-39.9, adult: Secondary | ICD-10-CM | POA: Diagnosis not present

## 2014-04-07 DIAGNOSIS — Z713 Dietary counseling and surveillance: Secondary | ICD-10-CM | POA: Diagnosis not present

## 2014-04-07 NOTE — Progress Notes (Signed)
  Follow-up visit:  8 Weeks Post-Operative LAGB Surgery  Medical Nutrition Therapy:  Appt start time: 4235 end time:  3614.  Primary concerns today: Post-operative Bariatric Surgery Nutrition Management. Mary Morrison is here today. Has had a lot stress recently. Lost her job and her dog. Has not been able to sleep recently and will be taking Trazodone.   Is feeling "starving" had one fill 0.5 cc on 8/14. Eating 1440 calories per day using MyFitness Pal. Having no restriction. States that she does not like being on a low carb diet and wants to add healthy fats. Eating some carbs such as fruit, potatoes, and noodles.   Feeling satisfied with losing 2 lbs per week.   Surgery date: 02/17/2014 Surgery type: LAGB Start weight at [facility]: 247 lbs on 10/09/13, 259 lbs before surgery Weight today: 229.0 lbs  Weight change: 9 lbs Total weight loss: 30 lbs   TANITA  BODY COMP RESULTS  02/02/14 03/03/14 04/07/14   BMI (kg/m^2) 39.9 38.4 37.0   Fat Mass (lbs) 109 101.5 95.5   Fat Free Mass (lbs) 138.5 136.5 133.5   Total Body Water (lbs) 101.5 100.0 97.5    Preferred Learning Style:   No preference indicated   Learning Readiness:   Contemplating  24-hr recall: B (AM): 2 boiled eggs and a tomato (12 g) Snk (AM): protein J Robb smoothie with 2% milk, almond butter (32 g) L (PM):  1 cup vension  Snk (PM): sugar free chocolate mint patties, decaf with coffee mate, tuna, almond butter  D (PM): progresso soup (chicken noodle or tomato or black bean) with green bean  Snk (PM): sugar free chocolate mint patties, decaf with coffee mate, tuna, almond butter, or fruit   Fluid intake: coffee and at least 64 oz  Estimated total protein intake: 90 g protein  Medications: see list Supplementation: taking  Using straws: occassionally  Drinking while eating: No Hair loss: No  Carbonated beverages: No N/V/D/C: vomited twice after eating chicken too quickly and having both diarrhea and constipation  Dumping  syndrome: No Last Lap-Band fill:  one fill 0.5 cc on 8/14  Recent physical activity:  Walking 60 minutes 5-6 week, 1-2 x strength with trainer for 30 minutes, 1-2 x week yoga  Progress Towards Goal(s):  In progress.  Handouts given during visit include:  Phase 3B High Protein + Non Starchy vegetables   Nutritional Diagnosis:  [name] Overweight/obesity related to past poor dietary habits and physical inactivity as evidenced by patient w/ recent LAGB surgery following dietary guidelines for continued weight loss.    Intervention:  Nutrition education/diet advancment. Goals:  Follow Phase 3B: High Protein + Non-Starchy Vegetables  Eat 3-6 small meals/snacks, every 3-5 hrs  Increase lean protein foods to meet 60g goal  Increase fluid intake to 64oz +  Avoid drinking 15 minutes before, during and 30 minutes after eating  Aim for >30 min of physical activity daily   Teaching Method Utilized:  Visual Auditory Hands on  Barriers to learning/adherence to lifestyle change: very hungry, stressed, doesn't want to be on a low carb diet  Demonstrated degree of understanding via:  Teach Back   Monitoring/Evaluation:  Dietary intake, exercise, lap band fills, and body weight. Follow up in 5 week for 3 month post-op visit.

## 2014-04-07 NOTE — Patient Instructions (Signed)
Goals:  Follow Phase 3B: High Protein + Non-Starchy Vegetables  Eat 3-6 small meals/snacks, every 3-5 hrs  Increase lean protein foods to meet 60g goal  Increase fluid intake to 64oz +  Avoid drinking 15 minutes before, during and 30 minutes after eating  Aim for >30 min of physical activity daily  

## 2014-04-09 ENCOUNTER — Encounter (INDEPENDENT_AMBULATORY_CARE_PROVIDER_SITE_OTHER): Payer: 59 | Admitting: Surgery

## 2014-05-18 ENCOUNTER — Ambulatory Visit: Payer: 59 | Admitting: Dietician

## 2014-06-15 ENCOUNTER — Ambulatory Visit: Payer: 59 | Admitting: Dietician

## 2014-06-26 ENCOUNTER — Encounter: Payer: 59 | Attending: Surgery | Admitting: Dietician

## 2014-06-26 DIAGNOSIS — Z9884 Bariatric surgery status: Secondary | ICD-10-CM | POA: Diagnosis not present

## 2014-06-26 DIAGNOSIS — Z713 Dietary counseling and surveillance: Secondary | ICD-10-CM | POA: Diagnosis not present

## 2014-06-26 DIAGNOSIS — Z6834 Body mass index (BMI) 34.0-34.9, adult: Secondary | ICD-10-CM | POA: Diagnosis not present

## 2014-06-26 DIAGNOSIS — E669 Obesity, unspecified: Secondary | ICD-10-CM | POA: Insufficient documentation

## 2014-06-26 NOTE — Patient Instructions (Addendum)
   Get back on a schedule and pre plan meals  Surgery date: 02/17/2014 Surgery type: LAGB Start weight at [facility]: 247 lbs on 10/09/13, 259 lbs before surgery Weight today: 216 lbs Weight change: 13 lbs Total weight loss: 43 lbs  TANITA  BODY COMP RESULTS  02/02/14 03/03/14 04/07/14 06/26/14   BMI (kg/m^2) 39.9 38.4 37.0 34.3   Fat Mass (lbs) 109 101.5 95.5 90.5   Fat Free Mass (lbs) 138.5 136.5 133.5 125.5   Total Body Water (lbs) 101.5 100.0 97.5 92

## 2014-06-26 NOTE — Progress Notes (Signed)
  Follow-up visit:  5 months Post-Operative LAGB Surgery  Medical Nutrition Therapy:  Appt start time: 1030 end time:  1100  Primary concerns today: Post-operative Bariatric Surgery Nutrition Management. Armenta returns stating she recently regained 2 pounds. She has been in [place] training for her new job. Additionally, last week was Thanksgiving. She reports that she is proud of herself for getting through a very stressful time without bingeing or drinking alcohol. Does not want to feel deprived and will have some "healthy carbs." Uses My Fitness Pal and eating 1400-1500 calories per day. She sees a Social worker regularly. Having a lot of hunger and feels like her "pouch is too large." Yesterday she ate a 6-oz steak with 2 vegetable sides. Weighs herself every 2 weeks.   Surgery date: 02/17/2014 Surgery type: LAGB [facility] weight at [facility]: 247 lbs on 10/09/13, 259 lbs before surgery Weight today: 216 lbs Weight change: 13 lbs Total weight loss: 43 lbs   TANITA  BODY COMP RESULTS  02/02/14 03/03/14 04/07/14 06/26/14   BMI (kg/m^2) 39.9 38.4 37.0 34.3   Fat Mass (lbs) 109 101.5 95.5 90.5   Fat Free Mass (lbs) 138.5 136.5 133.5 125.5   Total Body Water (lbs) 101.5 100.0 97.5 92    Preferred Learning Style:   No preference indicated   Learning Readiness:   Ready  24-hr recall: B (AM): 2 boiled eggs or a smoothie Snk (AM):  L (PM):  6 oz filet with broccoli and asparagus   Snk (PM): 1/2 banana and almond butter with milk D (PM): tuna and chicken and dumpling soup Snk (PM): sugar free chocolates with decaf coffee  Fluid intake: decaf coffee and water  Estimated total protein intake: 100+ g protein per patient (less than 100g CHO per day per pt)  Medications: see list Supplementation: taking  Using straws: occassionally  Drinking while eating: No Hair loss: No  Carbonated beverages: sips of sparkling water N/V/D/C: vomiting after eating chicken too quickly; diarrhea with sugar  alcohols Dumping syndrome: No Last Lap-Band fill:  11/12 (amount unknown); another appt scheduled next week  Recent physical activity:  None recently due to knee injury  Progress Towards Goal(s):  In progress.    Nutritional Diagnosis:  [place]-3.3 Overweight/obesity related to past poor dietary habits and physical inactivity as evidenced by patient w/ recent LAGB surgery following dietary guidelines for continued weight loss.    Intervention:  Nutrition education/diet advancment.   Teaching Method Utilized:  Visual Auditory Hands on  Barriers to learning/adherence to lifestyle change: very hungry, stressed, doesn't want to be on a low carb diet  Demonstrated degree of understanding via:  Teach Back   Monitoring/Evaluation:  Dietary intake, exercise, lap band fills, and body weight. Follow up in 3 months for 8 month post-op visit.

## 2014-07-29 ENCOUNTER — Other Ambulatory Visit: Payer: Self-pay

## 2014-07-29 DIAGNOSIS — Z1231 Encounter for screening mammogram for malignant neoplasm of breast: Secondary | ICD-10-CM

## 2014-08-14 ENCOUNTER — Ambulatory Visit: Payer: Self-pay

## 2014-08-31 ENCOUNTER — Other Ambulatory Visit: Payer: Self-pay | Admitting: Physician Assistant

## 2014-08-31 DIAGNOSIS — M25562 Pain in left knee: Secondary | ICD-10-CM

## 2014-09-03 ENCOUNTER — Other Ambulatory Visit: Payer: Self-pay | Admitting: Obstetrics and Gynecology

## 2014-09-03 NOTE — Patient Instructions (Addendum)
   Your procedure is scheduled on:  Monday, Feb 29  Enter through the Main Entrance of [facility] at: 11:30 AM Pick up the phone at the desk and dial [phone] and inform us of your arrival.  Please call this number if you have any problems the morning of surgery: [phone]  Remember: Do not eat food after midnight: Sunday Do not drink clear liquids after: 6 AM Monday, day of surgery Take these medicines the morning of surgery with a SIP OF WATER:  None  Do not wear jewelry, make-up, or FINGER nail polish No metal in your hair or on your body. Do not wear lotions, powders, perfumes.  You may wear deodorant.  Do not bring valuables to the hospital. Contacts, dentures or bridgework may not be worn into surgery.  Leave suitcase in the car. After Surgery it may be brought to your room. For patients being admitted to the hospital, checkout time is 11:00am the day of discharge.  Home with stepmother Barrett Henle cell [phone].

## 2014-09-07 ENCOUNTER — Encounter (HOSPITAL_COMMUNITY)
Admission: RE | Admit: 2014-09-07 | Discharge: 2014-09-07 | Disposition: A | Discharge status: Home / Self Care | Payer: 59 | Source: Ambulatory Visit | Attending: Obstetrics and Gynecology | Admitting: Obstetrics and Gynecology

## 2014-09-07 ENCOUNTER — Encounter (HOSPITAL_COMMUNITY): Payer: Self-pay

## 2014-09-07 DIAGNOSIS — Z01812 Encounter for preprocedural laboratory examination: Secondary | ICD-10-CM | POA: Insufficient documentation

## 2014-09-07 DIAGNOSIS — D259 Leiomyoma of uterus, unspecified: Secondary | ICD-10-CM | POA: Diagnosis not present

## 2014-09-07 DIAGNOSIS — N920 Excessive and frequent menstruation with regular cycle: Secondary | ICD-10-CM | POA: Diagnosis not present

## 2014-09-07 HISTORY — DX: Benign neoplasm of connective and other soft tissue, unspecified: D21.9

## 2014-09-07 LAB — CBC
HCT: 38.9 % (ref 36.0–46.0)
Hemoglobin: 12.4 g/dL (ref 12.0–15.0)
MCH: 29.2 pg (ref 26.0–34.0)
MCHC: 31.9 g/dL (ref 30.0–36.0)
MCV: 91.7 fL (ref 78.0–100.0)
Platelets: 236 10*3/uL (ref 150–400)
RBC: 4.24 MIL/uL (ref 3.87–5.11)
RDW: 14.9 % (ref 11.5–15.5)
WBC: 5.4 10*3/uL (ref 4.0–10.5)

## 2014-09-07 LAB — BASIC METABOLIC PANEL
Anion gap: 3 — ABNORMAL LOW (ref 5–15)
BUN: 14 mg/dL (ref 6–23)
CO2: 28 mmol/L (ref 19–32)
Calcium: 9 mg/dL (ref 8.4–10.5)
Chloride: 105 mmol/L (ref 96–112)
Creatinine, Ser: 0.96 mg/dL (ref 0.50–1.10)
GFR calc Af Amer: 79 mL/min — ABNORMAL LOW (ref >90)
GFR calc non Af Amer: 68 mL/min — ABNORMAL LOW (ref >90)
Glucose, Bld: 123 mg/dL — ABNORMAL HIGH (ref 70–99)
Potassium: 4.2 mmol/L (ref 3.5–5.1)
Sodium: 136 mmol/L (ref 135–145)

## 2014-09-18 ENCOUNTER — Other Ambulatory Visit: Payer: 59

## 2014-09-19 ENCOUNTER — Ambulatory Visit
Admission: RE | Admit: 2014-09-19 | Discharge: 2014-09-19 | Disposition: A | Discharge status: Home / Self Care | Payer: 59 | Source: Ambulatory Visit | Attending: Physician Assistant | Admitting: Physician Assistant

## 2014-09-19 DIAGNOSIS — M25562 Pain in left knee: Secondary | ICD-10-CM

## 2014-09-21 ENCOUNTER — Ambulatory Visit (HOSPITAL_COMMUNITY): Payer: 59 | Admitting: Certified Registered Nurse Anesthetist

## 2014-09-21 ENCOUNTER — Encounter (HOSPITAL_COMMUNITY): Payer: Self-pay | Admitting: Certified Registered Nurse Anesthetist

## 2014-09-21 ENCOUNTER — Ambulatory Visit (HOSPITAL_COMMUNITY)
Admission: RE | Admit: 2014-09-21 | Discharge: 2014-09-22 | Disposition: A | Discharge status: Home / Self Care | Payer: 59 | Source: Ambulatory Visit | Attending: Obstetrics and Gynecology | Admitting: Obstetrics and Gynecology

## 2014-09-21 ENCOUNTER — Encounter (HOSPITAL_COMMUNITY)
Admission: RE | Disposition: A | Discharge status: Home / Self Care | Payer: Self-pay | Source: Ambulatory Visit | Attending: Obstetrics and Gynecology

## 2014-09-21 DIAGNOSIS — D259 Leiomyoma of uterus, unspecified: Secondary | ICD-10-CM | POA: Diagnosis present

## 2014-09-21 DIAGNOSIS — N803 Endometriosis of pelvic peritoneum: Secondary | ICD-10-CM | POA: Insufficient documentation

## 2014-09-21 DIAGNOSIS — D252 Subserosal leiomyoma of uterus: Secondary | ICD-10-CM | POA: Diagnosis not present

## 2014-09-21 DIAGNOSIS — D250 Submucous leiomyoma of uterus: Secondary | ICD-10-CM | POA: Diagnosis not present

## 2014-09-21 DIAGNOSIS — N920 Excessive and frequent menstruation with regular cycle: Secondary | ICD-10-CM | POA: Insufficient documentation

## 2014-09-21 DIAGNOSIS — Z90710 Acquired absence of both cervix and uterus: Secondary | ICD-10-CM | POA: Diagnosis present

## 2014-09-21 HISTORY — PX: ROBOT ASSISTED MYOMECTOMY: SHX5142

## 2014-09-21 HISTORY — PX: ROBOTIC ASSISTED TOTAL HYSTERECTOMY: SHX6085

## 2014-09-21 HISTORY — PX: BILATERAL SALPINGECTOMY: SHX5743

## 2014-09-21 SURGERY — ROBOTIC ASSISTED TOTAL HYSTERECTOMY
Anesthesia: General | Site: Abdomen

## 2014-09-21 MED ORDER — ROPIVACAINE HCL 5 MG/ML IJ SOLN
INTRAMUSCULAR | Status: DC | PRN
  Administered 2014-09-21: 120 mL

## 2014-09-21 MED ORDER — IBUPROFEN 800 MG PO TABS
800.0000 mg | ORAL_TABLET | Freq: Three times a day (TID) | ORAL | Status: DC | PRN
  Administered 2014-09-22: 800 mg via ORAL
  Filled 2014-09-21: qty 1

## 2014-09-21 MED ORDER — KETOROLAC TROMETHAMINE 30 MG/ML IJ SOLN
INTRAMUSCULAR | Status: AC
  Filled 2014-09-21: qty 1

## 2014-09-21 MED ORDER — HYDROMORPHONE HCL 1 MG/ML IJ SOLN: 0.2000 mg | INTRAMUSCULAR | Status: DC | PRN

## 2014-09-21 MED ORDER — ROPIVACAINE HCL 5 MG/ML IJ SOLN
INTRAMUSCULAR | Status: AC
  Filled 2014-09-21: qty 60

## 2014-09-21 MED ORDER — VASOPRESSIN 20 UNIT/ML IV SOLN
INTRAVENOUS | Status: AC
  Filled 2014-09-21: qty 1

## 2014-09-21 MED ORDER — LACTATED RINGERS IV SOLN
INTRAVENOUS | Status: DC
  Administered 2014-09-21 (×3): via INTRAVENOUS

## 2014-09-21 MED ORDER — SODIUM CHLORIDE 0.9 % IJ SOLN
INTRAMUSCULAR | Status: AC
  Filled 2014-09-21: qty 30

## 2014-09-21 MED ORDER — VASOPRESSIN 20 UNIT/ML IJ SOLN
INTRAMUSCULAR | Status: DC | PRN
  Administered 2014-09-21: 20 [IU] via SUBCUTANEOUS

## 2014-09-21 MED ORDER — CELECOXIB 200 MG PO CAPS
ORAL_CAPSULE | ORAL | Status: AC
  Filled 2014-09-21: qty 1

## 2014-09-21 MED ORDER — MIDAZOLAM HCL 2 MG/2ML IJ SOLN
INTRAMUSCULAR | Status: DC | PRN
  Administered 2014-09-21: 2 mg via INTRAVENOUS

## 2014-09-21 MED ORDER — NEOSTIGMINE METHYLSULFATE 10 MG/10ML IV SOLN
INTRAVENOUS | Status: DC | PRN
  Administered 2014-09-21: 3 mg via INTRAVENOUS

## 2014-09-21 MED ORDER — KETOROLAC TROMETHAMINE 60 MG/2ML IM SOLN
INTRAMUSCULAR | Status: DC | PRN
  Administered 2014-09-21: 30 mg via INTRAMUSCULAR

## 2014-09-21 MED ORDER — ROCURONIUM BROMIDE 100 MG/10ML IV SOLN
INTRAVENOUS | Status: AC
  Filled 2014-09-21: qty 1

## 2014-09-21 MED ORDER — FENTANYL CITRATE 0.05 MG/ML IJ SOLN
INTRAMUSCULAR | Status: AC
  Filled 2014-09-21: qty 2

## 2014-09-21 MED ORDER — MENTHOL 3 MG MT LOZG: 1.0000 | LOZENGE | OROMUCOSAL | Status: DC | PRN

## 2014-09-21 MED ORDER — ARTIFICIAL TEARS OP OINT
TOPICAL_OINTMENT | OPHTHALMIC | Status: AC
  Filled 2014-09-21: qty 3.5

## 2014-09-21 MED ORDER — TRAZODONE HCL 100 MG PO TABS
100.0000 mg | ORAL_TABLET | Freq: Every day | ORAL | Status: DC
  Administered 2014-09-21: 100 mg via ORAL
  Filled 2014-09-21: qty 1

## 2014-09-21 MED ORDER — CEFAZOLIN SODIUM-DEXTROSE 2-3 GM-% IV SOLR
INTRAVENOUS | Status: AC
  Filled 2014-09-21: qty 50

## 2014-09-21 MED ORDER — GLYCOPYRROLATE 0.2 MG/ML IJ SOLN
INTRAMUSCULAR | Status: AC
  Filled 2014-09-21: qty 3

## 2014-09-21 MED ORDER — PROPOFOL 10 MG/ML IV BOLUS
INTRAVENOUS | Status: DC | PRN
  Administered 2014-09-21: 200 mg via INTRAVENOUS
  Administered 2014-09-21: 30 mg via INTRAVENOUS

## 2014-09-21 MED ORDER — KETOROLAC TROMETHAMINE 30 MG/ML IJ SOLN
INTRAMUSCULAR | Status: DC | PRN
  Administered 2014-09-21: 30 mg via INTRAVENOUS

## 2014-09-21 MED ORDER — ONDANSETRON HCL 4 MG/2ML IJ SOLN
4.0000 mg | Freq: Four times a day (QID) | INTRAMUSCULAR | Status: DC | PRN
Start: 2014-09-21 — End: 2014-09-22

## 2014-09-21 MED ORDER — FENTANYL CITRATE 0.05 MG/ML IJ SOLN
INTRAMUSCULAR | Status: AC
  Filled 2014-09-21: qty 5

## 2014-09-21 MED ORDER — CELECOXIB 200 MG PO CAPS
400.0000 mg | ORAL_CAPSULE | Freq: Once | ORAL | Status: AC
  Administered 2014-09-21: 200 mg via ORAL

## 2014-09-21 MED ORDER — DEXTROSE IN LACTATED RINGERS 5 % IV SOLN
INTRAVENOUS | Status: DC
  Administered 2014-09-21: 21:00:00 via INTRAVENOUS

## 2014-09-21 MED ORDER — DEXAMETHASONE SODIUM PHOSPHATE 10 MG/ML IJ SOLN
INTRAMUSCULAR | Status: DC | PRN
  Administered 2014-09-21: 10 mg via INTRAVENOUS

## 2014-09-21 MED ORDER — MIDAZOLAM HCL 2 MG/2ML IJ SOLN
INTRAMUSCULAR | Status: AC
  Filled 2014-09-21: qty 2

## 2014-09-21 MED ORDER — GABAPENTIN 400 MG PO CAPS
ORAL_CAPSULE | ORAL | Status: AC
  Administered 2014-09-21: 800 mg
  Filled 2014-09-21: qty 2

## 2014-09-21 MED ORDER — LACTATED RINGERS IR SOLN
Status: DC | PRN
  Administered 2014-09-21: 3000 mL

## 2014-09-21 MED ORDER — CEFAZOLIN SODIUM-DEXTROSE 2-3 GM-% IV SOLR
2.0000 g | INTRAVENOUS | Status: AC
  Administered 2014-09-21: 2 g via INTRAVENOUS

## 2014-09-21 MED ORDER — ROCURONIUM BROMIDE 100 MG/10ML IV SOLN
INTRAVENOUS | Status: DC | PRN
  Administered 2014-09-21: 10 mg via INTRAVENOUS
  Administered 2014-09-21: 30 mg via INTRAVENOUS
  Administered 2014-09-21: 20 mg via INTRAVENOUS
  Administered 2014-09-21 (×2): 10 mg via INTRAVENOUS
  Administered 2014-09-21: 50 mg via INTRAVENOUS

## 2014-09-21 MED ORDER — KETOROLAC TROMETHAMINE 30 MG/ML IJ SOLN: 30.0000 mg | Freq: Four times a day (QID) | INTRAMUSCULAR | Status: DC

## 2014-09-21 MED ORDER — STERILE WATER FOR IRRIGATION IR SOLN
Status: DC | PRN
  Administered 2014-09-21: 250 mL via INTRAVESICAL

## 2014-09-21 MED ORDER — LIDOCAINE HCL (CARDIAC) 20 MG/ML IV SOLN
INTRAVENOUS | Status: AC
  Filled 2014-09-21: qty 5

## 2014-09-21 MED ORDER — PROMETHAZINE HCL 25 MG/ML IJ SOLN
INTRAMUSCULAR | Status: AC
  Filled 2014-09-21: qty 1

## 2014-09-21 MED ORDER — SIMETHICONE 80 MG PO CHEW: 80.0000 mg | CHEWABLE_TABLET | Freq: Four times a day (QID) | ORAL | Status: DC | PRN

## 2014-09-21 MED ORDER — ONDANSETRON HCL 4 MG PO TABS: 4.0000 mg | ORAL_TABLET | Freq: Four times a day (QID) | ORAL | Status: DC | PRN

## 2014-09-21 MED ORDER — SCOPOLAMINE 1 MG/3DAYS TD PT72
MEDICATED_PATCH | TRANSDERMAL | Status: AC
  Filled 2014-09-21: qty 1

## 2014-09-21 MED ORDER — DOCUSATE SODIUM 100 MG PO CAPS
100.0000 mg | ORAL_CAPSULE | Freq: Two times a day (BID) | ORAL | Status: DC
  Filled 2014-09-21: qty 1

## 2014-09-21 MED ORDER — BUPIVACAINE HCL (PF) 0.25 % IJ SOLN
INTRAMUSCULAR | Status: AC
  Filled 2014-09-21: qty 30

## 2014-09-21 MED ORDER — KETOROLAC TROMETHAMINE 30 MG/ML IJ SOLN: 30.0000 mg | Freq: Once | INTRAMUSCULAR | Status: DC | PRN

## 2014-09-21 MED ORDER — NEOSTIGMINE METHYLSULFATE 10 MG/10ML IV SOLN
INTRAVENOUS | Status: AC
  Filled 2014-09-21: qty 1

## 2014-09-21 MED ORDER — GLYCOPYRROLATE 0.2 MG/ML IJ SOLN
INTRAMUSCULAR | Status: DC | PRN
  Administered 2014-09-21: 0.6 mg via INTRAVENOUS

## 2014-09-21 MED ORDER — ARTIFICIAL TEARS OP OINT
TOPICAL_OINTMENT | OPHTHALMIC | Status: DC | PRN
  Administered 2014-09-21: 1 via OPHTHALMIC

## 2014-09-21 MED ORDER — PROMETHAZINE HCL 25 MG/ML IJ SOLN
6.2500 mg | Freq: Once | INTRAMUSCULAR | Status: AC
  Administered 2014-09-21: 6.25 mg via INTRAVENOUS

## 2014-09-21 MED ORDER — PANTOPRAZOLE SODIUM 40 MG PO TBEC
40.0000 mg | DELAYED_RELEASE_TABLET | Freq: Every day | ORAL | Status: DC
  Filled 2014-09-21: qty 1

## 2014-09-21 MED ORDER — DEXAMETHASONE SODIUM PHOSPHATE 10 MG/ML IJ SOLN
INTRAMUSCULAR | Status: AC
  Filled 2014-09-21: qty 1

## 2014-09-21 MED ORDER — KETOROLAC TROMETHAMINE 30 MG/ML IJ SOLN
30.0000 mg | Freq: Four times a day (QID) | INTRAMUSCULAR | Status: DC
  Administered 2014-09-21: 30 mg via INTRAVENOUS
  Filled 2014-09-21 (×2): qty 1

## 2014-09-21 MED ORDER — SODIUM CHLORIDE 0.9 % IJ SOLN
INTRAMUSCULAR | Status: AC
  Filled 2014-09-21: qty 10

## 2014-09-21 MED ORDER — ACETAMINOPHEN 10 MG/ML IV SOLN
INTRAVENOUS | Status: DC | PRN
  Administered 2014-09-21: 1000 mg via INTRAVENOUS

## 2014-09-21 MED ORDER — OXYCODONE-ACETAMINOPHEN 5-325 MG PO TABS: 1.0000 | ORAL_TABLET | ORAL | Status: DC | PRN

## 2014-09-21 MED ORDER — GABAPENTIN 800 MG PO TABS
800.0000 mg | ORAL_TABLET | Freq: Once | ORAL | Status: DC
  Filled 2014-09-21: qty 1

## 2014-09-21 MED ORDER — SODIUM CHLORIDE 0.9 % IJ SOLN
INTRAMUSCULAR | Status: AC
Start: 2014-09-21 — End: 2014-09-21
  Filled 2014-09-21: qty 50

## 2014-09-21 MED ORDER — ONDANSETRON HCL 4 MG/2ML IJ SOLN: 4.0000 mg | Freq: Once | INTRAMUSCULAR | Status: DC | PRN

## 2014-09-21 MED ORDER — SCOPOLAMINE 1 MG/3DAYS TD PT72
1.0000 | MEDICATED_PATCH | Freq: Once | TRANSDERMAL | Status: DC
  Administered 2014-09-21: 1.5 mg via TRANSDERMAL

## 2014-09-21 MED ORDER — ONDANSETRON HCL 4 MG/2ML IJ SOLN
INTRAMUSCULAR | Status: DC | PRN
  Administered 2014-09-21: 4 mg via INTRAVENOUS

## 2014-09-21 MED ORDER — ACETAMINOPHEN 10 MG/ML IV SOLN
1000.0000 mg | Freq: Once | INTRAVENOUS | Status: DC
  Filled 2014-09-21: qty 100

## 2014-09-21 MED ORDER — FENTANYL CITRATE 0.05 MG/ML IJ SOLN
INTRAMUSCULAR | Status: DC | PRN
  Administered 2014-09-21 (×2): 100 ug via INTRAVENOUS
  Administered 2014-09-21: 50 ug via INTRAVENOUS

## 2014-09-21 MED ORDER — LIDOCAINE HCL (CARDIAC) 20 MG/ML IV SOLN
INTRAVENOUS | Status: DC | PRN
  Administered 2014-09-21: 80 mg via INTRAVENOUS

## 2014-09-21 MED ORDER — PROPOFOL 10 MG/ML IV BOLUS
INTRAVENOUS | Status: AC
  Filled 2014-09-21: qty 20

## 2014-09-21 SURGICAL SUPPLY — 67 items
BARRIER ADHS 3X4 INTERCEED (GAUZE/BANDAGES/DRESSINGS) IMPLANT
BRR ADH 4X3 ABS CNTRL BYND (GAUZE/BANDAGES/DRESSINGS)
CATH FOLEY 3WAY  5CC 16FR (CATHETERS) ×1
CATH FOLEY 3WAY 5CC 16FR (CATHETERS) ×3 IMPLANT
CLOTH BEACON ORANGE TIMEOUT ST (SAFETY) ×4 IMPLANT
CONT PATH 16OZ SNAP LID 3702 (MISCELLANEOUS) ×4 IMPLANT
COVER BACK TABLE 60X90IN (DRAPES) ×8 IMPLANT
COVER LIGHT HANDLE  1/PK (MISCELLANEOUS) ×1
COVER LIGHT HANDLE 1/PK (MISCELLANEOUS) ×3 IMPLANT
COVER TIP SHEARS 8 DVNC (MISCELLANEOUS) ×3 IMPLANT
COVER TIP SHEARS 8MM DA VINCI (MISCELLANEOUS) ×1
DECANTER SPIKE VIAL GLASS SM (MISCELLANEOUS) ×32 IMPLANT
DEVICE TROCAR PUNCTURE CLOSURE (ENDOMECHANICALS) IMPLANT
DRAPE WARM FLUID 44X44 (DRAPE) ×4 IMPLANT
DRSG OPSITE POSTOP 3X4 (GAUZE/BANDAGES/DRESSINGS) ×8 IMPLANT
DURAPREP 26ML APPLICATOR (WOUND CARE) ×4 IMPLANT
ELECT REM PT RETURN 9FT ADLT (ELECTROSURGICAL) ×4
ELECTRODE REM PT RTRN 9FT ADLT (ELECTROSURGICAL) ×3 IMPLANT
GAUZE VASELINE 3X9 (GAUZE/BANDAGES/DRESSINGS) IMPLANT
GLOVE BIOGEL PI IND STRL 7.0 (GLOVE) ×24 IMPLANT
GLOVE BIOGEL PI INDICATOR 7.0 (GLOVE) ×10
GLOVE ECLIPSE 6.5 STRL STRAW (GLOVE) ×24 IMPLANT
GLOVE SURG SS PI 7.0 STRL IVOR (GLOVE) ×4 IMPLANT
KIT ACCESSORY DA VINCI DISP (KITS) ×1
KIT ACCESSORY DVNC DISP (KITS) ×3 IMPLANT
LEGGING LITHOTOMY PAIR STRL (DRAPES) ×1 IMPLANT
LIQUID BAND (GAUZE/BANDAGES/DRESSINGS) ×4 IMPLANT
NDL SAFETY ECLIPSE 18X1.5 (NEEDLE) ×1 IMPLANT
NEEDLE HYPO 18GX1.5 SHARP (NEEDLE) ×4
NEEDLE INSUFFLATION 150MM (ENDOMECHANICALS) ×4 IMPLANT
NEEDLE SPNL 22GX7 QUINCKE BK (NEEDLE) ×4 IMPLANT
OCCLUDER COLPOPNEUMO (BALLOONS) ×8 IMPLANT
PACK ROBOT WH (CUSTOM PROCEDURE TRAY) ×4 IMPLANT
PACK ROBOTIC GOWN (GOWN DISPOSABLE) ×4 IMPLANT
PAD POSITIONER PINK NONSTERILE (MISCELLANEOUS) ×4 IMPLANT
PAD PREP 24X48 CUFFED NSTRL (MISCELLANEOUS) ×8 IMPLANT
SCISSORS LAP 5X35 DISP (ENDOMECHANICALS) IMPLANT
SET CYSTO W/LG BORE CLAMP LF (SET/KITS/TRAYS/PACK) ×4 IMPLANT
SET IRRIG TUBING LAPAROSCOPIC (IRRIGATION / IRRIGATOR) ×4 IMPLANT
SET TRI-LUMEN FLTR TB AIRSEAL (TUBING) ×4 IMPLANT
SUT CHROMIC 3 0 SH 27 (SUTURE) ×4 IMPLANT
SUT VIC AB 0 CT1 36 (SUTURE) ×8 IMPLANT
SUT VICRYL 0 UR6 27IN ABS (SUTURE) ×4 IMPLANT
SUT VICRYL 4-0 PS2 18IN ABS (SUTURE) ×16 IMPLANT
SUT VLOC 180 0 9IN  GS21 (SUTURE) ×1
SUT VLOC 180 0 9IN GS21 (SUTURE) ×3 IMPLANT
SYR 30ML LL (SYRINGE) ×4 IMPLANT
SYR 50ML LL SCALE MARK (SYRINGE) ×8 IMPLANT
SYR CONTROL 10ML LL (SYRINGE) ×4 IMPLANT
SYR TB 1ML 25GX5/8 (SYRINGE) ×4 IMPLANT
SYRINGE 10CC LL (SYRINGE) ×4 IMPLANT
TIP RUMI ORANGE 6.7MMX12CM (TIP) IMPLANT
TIP UTERINE 5.1X6CM LAV DISP (MISCELLANEOUS) IMPLANT
TIP UTERINE 6.7X10CM GRN DISP (MISCELLANEOUS) ×4 IMPLANT
TIP UTERINE 6.7X6CM WHT DISP (MISCELLANEOUS) IMPLANT
TIP UTERINE 6.7X8CM BLUE DISP (MISCELLANEOUS) IMPLANT
TOWEL OR 17X24 6PK STRL BLUE (TOWEL DISPOSABLE) ×12 IMPLANT
TRAY FOLEY CATH 14FR (SET/KITS/TRAYS/PACK) ×4 IMPLANT
TROCAR DISP BLADELESS 8 DVNC (TROCAR) ×1 IMPLANT
TROCAR DISP BLADELESS 8MM (TROCAR) ×1
TROCAR PORT AIRSEAL 5X120 (TROCAR) IMPLANT
TROCAR PORT AIRSEAL 8X120 (TROCAR) ×8 IMPLANT
TROCAR XCEL 12X100 BLDLESS (ENDOMECHANICALS) ×4 IMPLANT
TROCAR XCEL NON-BLD 5MMX100MML (ENDOMECHANICALS) ×8 IMPLANT
TROCAR Z-THREAD 12X150 (TROCAR) ×4 IMPLANT
WARMER LAPAROSCOPE (MISCELLANEOUS) ×4 IMPLANT
WATER STERILE IRR 1000ML POUR (IV SOLUTION) ×12 IMPLANT

## 2014-09-21 NOTE — Transfer of Care (Signed)
Immediate Anesthesia Transfer of Care Note  Patient: Mary Morrison  Procedure(s) Performed: Procedure(s) with comments: ROBOTIC ASSISTED TOTAL HYSTERECTOMY (N/A) - Vaginal BILATERAL SALPINGECTOMY (Bilateral) - Vaginal  Patient Location: PACU  Anesthesia Type:General  Level of Consciousness: awake, alert  and oriented  Airway & Oxygen Therapy: Patient Spontanous Breathing and Patient connected to nasal cannula oxygen  Post-op Assessment: Report given to RN and Post -op Vital signs reviewed and stable  Post vital signs: Reviewed and stable  Last Vitals:  Filed Vitals:   09/21/14 1124  BP: 119/75  Pulse: 78  Temp: 36.8 C  Resp: 18    Complications: No apparent anesthesia complications

## 2014-09-21 NOTE — Brief Op Note (Signed)
09/21/2014  7:11 PM  PATIENT:  Mary Morrison  49 y.o. female  PRE-OPERATIVE DIAGNOSIS:  Monorrhagia, Fibroid Uterus, previous Cesarean section  POST-OPERATIVE DIAGNOSIS:  Monorrhagia, Fibroid( SM/SS/IM) Uterus, previous cesarean section, stage I pelvic endometriosis  PROCEDURE:  Da Vinci robotic total hysterectomy, bilateral salpingectomy, robotic myomectomy, excision of pelvic endometriosis  SURGEON:  Surgeon(s) and Role:    *  A , MD - Primary  PHYSICIAN ASSISTANT:   ASSISTANTS: Laury Deep, CNM   ANESTHESIA:   general Findings: large 10 cm anterior fibroid, prior C/S scar, nl tubes, right ov with cyst, nl left ov, pelvic endometriosis on left side wall, post left ovarian fossa, ureters  bilaterally seen EBL:   175 I/O 1550/350 clear yellow urine  BLOOD ADMINISTERED:none  DRAINS: none   LOCAL MEDICATIONS USED:  MARCAINE    and OTHER ropivacaine  SPECIMEN:  Source of Specimen:  uterus with cervix, myoma, tubes  2) left pelvic side wall endomertriosis, 3) left ovarian fossa/lateral  pevic endcmetriosis  DISPOSITION OF SPECIMEN:  PATHOLOGY  COUNTS:  YES  TOURNIQUET:  * No tourniquets in log *  DICTATION: .Other Dictation: Dictation Number [id number]  PLAN OF CARE: Admit for overnight observation  PATIENT DISPOSITION:  PACU - hemodynamically stable.   Delay start of Pharmacological VTE agent (>24hrs) due to surgical blood loss or risk of bleeding: yes

## 2014-09-21 NOTE — Anesthesia Procedure Notes (Signed)
Procedure Name: Intubation Date/Time: 09/21/2014 1:26 PM Performed by: [place] Ravens HRISTOVA Pre-anesthesia Checklist: Patient identified, Emergency Drugs available, Suction available and Patient being monitored Patient Re-evaluated:Patient Re-evaluated prior to inductionOxygen Delivery Method: Circle system utilized Preoxygenation: Pre-oxygenation with 100% oxygen Intubation Type: IV induction Ventilation: Mask ventilation without difficulty Laryngoscope Size: Mac and 3 Grade View: Grade I Tube type: Oral Tube size: 7.0 mm Number of attempts: 1 Placement Confirmation: ETT inserted through vocal cords under direct vision,  positive ETCO2,  CO2 detector and breath sounds checked- equal and bilateral Secured at: 22 cm Tube secured with: Tape Dental Injury: Teeth and Oropharynx as per pre-operative assessment

## 2014-09-21 NOTE — Anesthesia Preprocedure Evaluation (Signed)
Anesthesia Evaluation  Patient identified by MRN, date of birth, ID band Patient awake    Reviewed: Allergy & Precautions, H&P , NPO status , Patient's Chart, lab work & pertinent test results  Airway Mallampati: I  TM Distance: >3 FB Neck ROM: full    Dental no notable dental hx. (+) Teeth Intact   Pulmonary neg pulmonary ROS,    Pulmonary exam normal       Cardiovascular negative cardio ROS      Neuro/Psych negative neurological ROS  negative psych ROS   GI/Hepatic negative GI ROS, Neg liver ROS,   Endo/Other  negative endocrine ROS  Renal/GU negative Renal ROS     Musculoskeletal   Abdominal Normal abdominal exam  (+)   Peds  Hematology negative hematology ROS (+)   Anesthesia Other Findings   Reproductive/Obstetrics negative OB ROS                             Anesthesia Physical Anesthesia Plan  ASA: II  Anesthesia Plan: General   Post-op Pain Management:    Induction: Intravenous  Airway Management Planned: Oral ETT  Additional Equipment:   Intra-op Plan:   Post-operative Plan: Extubation in OR  Informed Consent: I have reviewed the patients History and Physical, chart, labs and discussed the procedure including the risks, benefits and alternatives for the proposed anesthesia with the patient or authorized representative who has indicated his/her understanding and acceptance.   Dental Advisory Given  Plan Discussed with: CRNA and Surgeon  Anesthesia Plan Comments:         Anesthesia Quick Evaluation

## 2014-09-22 ENCOUNTER — Encounter (HOSPITAL_COMMUNITY): Payer: Self-pay | Admitting: Obstetrics and Gynecology

## 2014-09-22 DIAGNOSIS — D250 Submucous leiomyoma of uterus: Secondary | ICD-10-CM | POA: Diagnosis not present

## 2014-09-22 LAB — CBC
HCT: 31.3 % — ABNORMAL LOW (ref 36.0–46.0)
Hemoglobin: 10.2 g/dL — ABNORMAL LOW (ref 12.0–15.0)
MCH: 29.3 pg (ref 26.0–34.0)
MCHC: 32.6 g/dL (ref 30.0–36.0)
MCV: 89.9 fL (ref 78.0–100.0)
Platelets: 207 10*3/uL (ref 150–400)
RBC: 3.48 MIL/uL — ABNORMAL LOW (ref 3.87–5.11)
RDW: 14.4 % (ref 11.5–15.5)
WBC: 7.9 10*3/uL (ref 4.0–10.5)

## 2014-09-22 LAB — BASIC METABOLIC PANEL
Anion gap: 3 — ABNORMAL LOW (ref 5–15)
BUN: 10 mg/dL (ref 6–23)
CO2: 27 mmol/L (ref 19–32)
Calcium: 8.1 mg/dL — ABNORMAL LOW (ref 8.4–10.5)
Chloride: 105 mmol/L (ref 96–112)
Creatinine, Ser: 0.92 mg/dL (ref 0.50–1.10)
GFR calc Af Amer: 83 mL/min — ABNORMAL LOW (ref >90)
GFR calc non Af Amer: 72 mL/min — ABNORMAL LOW (ref >90)
Glucose, Bld: 121 mg/dL — ABNORMAL HIGH (ref 70–99)
Potassium: 4.1 mmol/L (ref 3.5–5.1)
Sodium: 135 mmol/L (ref 135–145)

## 2014-09-22 MED ORDER — IBUPROFEN 100 MG/5ML PO SUSP: 800.0000 mg | Freq: Four times a day (QID) | ORAL | Status: DC

## 2014-09-22 MED ORDER — TRAMADOL HCL 50 MG PO TABS: 100.0000 mg | ORAL_TABLET | Freq: Four times a day (QID) | ORAL | Status: DC | PRN

## 2014-09-22 NOTE — Anesthesia Postprocedure Evaluation (Signed)
Anesthesia Post Note  Patient: Mary Morrison  Procedure(s) Performed: Procedure(s) (LRB): ROBOTIC ASSISTED TOTAL HYSTERECTOMY, EXCISION OF PELVIC ENDOMETRIOSIS (N/A) BILATERAL SALPINGECTOMY (Bilateral) ROBOTIC ASSISTED MYOMECTOMY (N/A)  Anesthesia type: General  Patient location: PACU  Post pain: Pain level controlled  Post assessment: Post-op Vital signs reviewed  Last Vitals:  Filed Vitals:   09/22/14 0527  BP: 92/60  Pulse: 81  Temp: 37.2 C  Resp: 18    Post vital signs: Reviewed  Level of consciousness: sedated  Complications: No apparent anesthesia complications

## 2014-09-22 NOTE — Progress Notes (Signed)
Pt discharged home with friend... Discharge instructions reviewed with pt and she verbalized understanding... Condition stable... No equipment... Ambulated to car with Astrid Divine, NT.

## 2014-09-22 NOTE — Progress Notes (Signed)
Subjective: Patient reports tolerating PO and no problems voiding.  Reviewed intraop findings  Objective: I have reviewed patient's vital signs.  vital signs, intake and output and medications. Filed Vitals:   09/22/14 0527  BP: 92/60  Pulse: 81  Temp: 99 F (37.2 C)  Resp: 18   I/O last 3 completed shifts: In: 5219.6 [P.O.:1880; I.V.:3339.6] Out: 1125 [Urine:950; Blood:175]    Lab Results  Component Value Date   WBC 7.9 09/22/2014   HGB 10.2* 09/22/2014   HCT 31.3* 09/22/2014   MCV 89.9 09/22/2014   PLT 207 09/22/2014   Lab Results  Component Value Date   CREATININE 0.92 09/22/2014    EXAM General: alert, cooperative and no distress Resp: clear to auscultation bilaterally Cardio: regular rate and rhythm, S1, S2 normal, no murmur, click, rub or gallop GI: incision: clean, dry and intact Extremities: edema 1 + Vaginal Bleeding: minimal Back (-) CVAT Abd soft obese active BS  Assessment: s/p Procedure(s): ROBOTIC ASSISTED TOTAL HYSTERECTOMY, EXCISION OF PELVIC ENDOMETRIOSIS BILATERAL SALPINGECTOMY ROBOTIC ASSISTED MYOMECTOMY: stable, progressing well and anemia Hx Lap band surgery Plan: Encourage ambulation Discontinue IV fluids Discharge home  D/c home' Declines narcotic scripts Will resume meds for lap band surgery F/u 2 weeks  LOS: 1 day    , A, MD 09/22/2014 7:03 AM    09/22/2014, 7:03 AM

## 2014-09-22 NOTE — Discharge Summary (Signed)
Physician Discharge Summary  Patient ID: Mary Morrison MRN: [id number] DOB/AGE: [birth year] 49 y.o.  Admit date: 09/21/2014 Discharge date: 09/22/2014  Admission Diagnoses:menorrhagia , uterine fibroids, hx lap band surgery, previous cesarean section   Discharge Diagnoses: menorrhagia, SS//IM fibroid, hx lap band surgery, previous cesarean section, stage 1 pelvic endometriosis  Active Problems:   S/P hysterectomy  Hx lap band surgery Discharged Condition: stable  Hospital Course: pt was admitted to [facility]. She underwent da vinci robotic total hysterectomy, robotic myomectomy, bilateral salpingectomy, excision of pelvic endometriosis( see operative report). Uncomplicated postop course  Consults: None  Significant Diagnostic Studies: labs:  CBC Latest Ref Rng 09/22/2014 09/07/2014 02/18/2014  WBC 4.0 - 10.5 K/uL 7.9 5.4 6.1  Hemoglobin 12.0 - 15.0 g/dL 10.2(L) 12.4 10.6(L)  Hematocrit 36.0 - 46.0 % 31.3(L) 38.9 33.7(L)  Platelets 150 - 400 K/uL 207 236 213   Lab Results  Component Value Date   CREATININE 0.92 09/22/2014   CREATININE 0.96 09/07/2014   CREATININE 0.86 02/17/2014     Treatments: surgery: davinci robotic total hysterectomy, robotic myomectomy, bilateral salpingectomy,excision of pelvic endometriosis  Discharge Exam: Blood pressure 92/60, pulse 81, temperature 99 F (37.2 C), temperature source Oral, resp. rate 18, height 5' 6.5" (1.689 m), weight 99.338 kg (219 lb), SpO2 92 %. General appearance: alert, cooperative and no distress Back: no tenderness to percussion or palpation Resp: clear to auscultation bilaterally Cardio: regular rate and rhythm, S1, S2 normal, no murmur, click, rub or gallop GI: soft, active bs, nondistended Pelvic: deferred Extremities: edema trace Skin: Skin color, texture, turgor normal. No rashes or lesions Incision/Wound:well approximated. Clean/dry/intact  Disposition: 01-Home or Self Care  Discharge Instructions    Call MD for:   persistant nausea and vomiting    Complete by:  As directed      Call MD for:  redness, tenderness, or signs of infection (pain, swelling, redness, odor or green/yellow discharge around incision site)    Complete by:  As directed      Call MD for:  temperature >100.4    Complete by:  As directed      Diet general    Complete by:  As directed      Discharge instructions    Complete by:  As directed   Call if temperature greater than equal to 100.4, nothing per vagina for 4-6 weeks or severe nausea vomiting, increased incisional pain , drainage or redness in the incision site, no straining with bowel movements, showers no bath     May walk up steps    Complete by:  As directed      No wound care    Complete by:  As directed             Medication List    STOP taking these medications        ibuprofen 200 MG tablet  Commonly known as:  ADVIL,MOTRIN  Replaced by:  ibuprofen 100 MG/5ML suspension     medroxyPROGESTERone 10 MG tablet  Commonly known as:  PROVERA      TAKE these medications        CALCIUM PO  Take 1 tablet by mouth daily.     ibuprofen 100 MG/5ML suspension  Commonly known as:  CHILDRENS MOTRIN  Take 40 mLs (800 mg total) by mouth every 6 (six) hours.     Melatonin 10 MG Tabs  Take 10 mg by mouth.     MULTIVITAMINS Chew  Chew 1 tablet by mouth daily.  PROBIOTIC PO  Take 1 capsule by mouth daily.     traMADol 50 MG tablet  Commonly known as:  ULTRAM  Take 2 tablets (100 mg total) by mouth every 6 (six) hours as needed for moderate pain.     traZODone 50 MG tablet  Commonly known as:  DESYREL  Take 100 mg by mouth at bedtime.           Follow-up Information    Follow up with , A, MD On 10/05/2014.   Specialty:  Obstetrics and Gynecology   Contact information:   [address] [place] [address] [phone]       Signed: Servando Salina A 09/22/2014, 7:13 AM

## 2014-09-22 NOTE — Discharge Instructions (Signed)
Call if temperature greater than equal to 100.4, nothing per vagina for 4-6 weeks or severe nausea vomiting, increased incisional pain , drainage or redness in the incision site, no straining with bowel movements, showers no bath °

## 2014-09-22 NOTE — Anesthesia Postprocedure Evaluation (Signed)
  Anesthesia Post-op Note  Patient: Dava Najjar  Procedure(s) Performed: Procedure(s): ROBOTIC ASSISTED TOTAL HYSTERECTOMY, EXCISION OF PELVIC ENDOMETRIOSIS (N/A) BILATERAL SALPINGECTOMY (Bilateral) ROBOTIC ASSISTED MYOMECTOMY (N/A)  Patient Location: [facility]  Anesthesia Type:General  Level of Consciousness: awake, alert  and oriented  Airway and Oxygen Therapy: Patient Spontanous Breathing  Post-op Pain: none  Post-op Assessment: Post-op Vital signs reviewed, Patient's Cardiovascular Status Stable, Respiratory Function Stable, No signs of Nausea or vomiting and Adequate PO intake  Post-op Vital Signs: Reviewed and stable  Last Vitals:  Filed Vitals:   09/22/14 0527  BP: 92/60  Pulse: 81  Temp: 37.2 C  Resp: 18    Complications: No apparent anesthesia complications

## 2014-09-23 NOTE — Op Note (Signed)
Mary Morrison, CIVIL NO.:  [id number]  MEDICAL RECORD NO.:  [id number]  LOCATION:                                 FACILITY:  PHYSICIAN:  Servando Salina, M.D.DATE OF BIRTH:  April 17, 1966  DATE OF PROCEDURE: DATE OF DISCHARGE:                              OPERATIVE REPORT   PREOPERATIVE DIAGNOSES:  Previous cesarean section, menorrhagia, uterine fibroids.  PROCEDURE:  Insurance account manager hysterectomy with myomectomy, excision of pelvic endometriosis, bilateral salpingectomy.  POSTOPERATIVE DIAGNOSIS:  Stage I pelvic endometriosis, menorrhagia,  large submucosal/subserosal intramural fibroid, previous cesarean section.  ANESTHESIA:  General.  SURGEON:  Servando Salina, MD  ASSISTANT:  Laury Deep, CNM  PROCEDURE IN DETAIL:  Under adequate general anesthesia, the patient was placed in the dorsal lithotomy position.  She was sterilely prepped and draped in the usual fashion.  The patient was positioned for robotic surgery.  A three-way Foley catheter was placed.  A weighted speculum was placed in vagina.  Sims retractor was placed anteriorly.  A 0 Vicryl figure-of-eight sutures placed on the anterior and posterior lip of the cervix.  The uterus sounded to 10.5 cm.  A #10 uterine manipulator with a medium KOH ring was inserted without incident and retractors were removed.  Attention was then turned to the abdomen.  The patient has had a prior lap band surgery and had upper abdominal scars as a result. Supraumbilical incision was made after 0.25% Marcaine was injected. Veress needle was introduced, tested with normal saline.  2.5 L of CO2 was insufflated.  Veress needle was then removed.  A 12 mm disposable trocar with sleeve was introduced into the abdomen without incident. The robotic camera was then inserted.  Panoramic inspection was notable for the normal liver edge.  The lap band reservoir port  was noted in the upper abdomen.  The patient  was placed in Trendelenburg position.  It was then noticed that there was a large fibroid encompassing the anterior aspect of the uterus in the lower uterine Segment and extend to the upper half of the uterus.  The fallopian tubes could be seen on the left.  There was a focal area of endometriosis proximal to the round ligament and another with endometriotic lesion in the pelvic sidewall just above the pelvic brim.  The posterior cul-de-sac had some clear serous fluid and there was puckering of the peritoneum on the left posterior fossa area between the left uterosacral ligament and the ureter.  On the opposite side, the fallopian tubes were noted to be elongated and normal.  The ovary was noted to be normal bilaterally. Right ovary has 2 centimeter clear cyst.  The anterior cul-de-sac was without any lesion but there was evidence of prior scar from a cesarean section.  The procedure was started with robotic port sites being placed, 2 on the left side and 1 on the right, 8 mm port sites with 8 mm Air-Seal for assistant port.  Once this was done, the robot was docked.  I then went to the surgical console.  The PK was placed in #2, monopolar scissors was placed in #1, and ProGrasp was placed in #3. Uterine manipulation was started, facilitating better, started  on the left side.  The patient's left fallopian tube was grasped and the underlying mesosalpinx was serially clamped, cauterized, and then cut and subsequently the tube was removed.  Ureter was noted to be peristalsing very well.  The left retroperitoneal space was approximately opened near the round ligament area, window was placed in that posterior leaf.  The left utero-ovarian ligament then serially clamped, cauterized, and then cut and this was continued until the defect that was made in the opening on the posterior peritoneum on the broad ligament that had been opened, had been reached and the ovary was detached.  First, anterior  broad ligament was then opened and particular overly high on the large fibroid anteriorly with pushing the tissue off the fibroid anteriorly in the inferior aspect; however the lower uterine segment could not be seen in the cervical area and complete bladder flap was not able to be truly seen due to this large fibroid.  Attention was then turned to the opposite side.  Fallopian tube  again was grasped.  The ureter was noted to be peristalsing well.  The mesosalpinx was serially clamped, cauterized, and then cut, severing that right tube off from its attachment to the uterus and being taken out of the field.  The retroperitoneal space was then opened on the right.  Window was placed in the posterior leaf of the broad ligament.  The utero-ovarian ligament was then serially clamped, cauterized, and then cut.  The anterior and posterior broad ligament was then opened and carried down, unfortunately neither uterine vessels could be seen on either side due to the fibroid.  At that point, decision was then made to perform myomectomy.  Dilute solution of Pitressin was injected through the anterior abdominal wall and injected at the base of the proximal portion of the fibroid with respect to the uterus.  30 mL total was injected in separate location.  The monopolar scissors then was removed and the permanent cautery hook was then inserted.  Using the permanent cautery hook, the junction of the fibroid approximately was opened transversely and using the assistant with a single-tooth tenaculum and the PK to dissect around the fibroid was subsequently enucleated with cauterization being done for areas of bleeding from vessels underneath.  The large fibroid was then placed and right lateral upper abdomen.  Once this was done, it was then necessary to identify where the bladder  was, so retrofilling of the bladder with  dilute milk was done, some of the dissection off the lower uterine segment has  occurred on the right side, and not on the left.  With delineation of the reflection, the bladder was then continued to be sharply dissected off the lower uterine segment and displaced inferiorly.  Attention was then turned back to the uterine vessels. Continued attention was then placed on the ureter with respect to the uterine vessels.  Once it was skeletonized, serially clamped, cauterized, and then cut on the right.  Attention was then turned to the left.  The bladder was then brought down again inferiorly.  Uterine vessels were skeletonized.  The posterior broad ligament leaf was then cut and displaced inferiorly.  Uterine vessels once skeletonized on the left were then serially clamped, cauterized, and then cut.  Posteriorly, the junction of the top of the RUMI cup was identified.  Transverse incision was then made posteriorly.  Anteriorly, the bladder was further dissected inferiorly.  The cervicovaginal junction was then identified with the RUMI cup and a transverse incision was  then made, and using cautery for the vessels bilaterally, we then once again serially clamped, cauterized, and then cut.  This resulted in severing the uterus from its vaginal attachment.  Once this was done, the uterus was gently brought through the vagina.  The fibroid remained.  It was then brought into the field.  Bilaterally, bleeding vessels were clamped, cauterized. Hemostasis was then achieved, this large fibroid was needed to be removed.  Once again the permanent hook was then placed. Permanent cautery hook had been removed once the uterus anatomy showed the uterus without the fibroid.  The scissors was then reinserted.  The vessels had been done as previously dictated.  The fibroid was then brought down. The permanent cautery hook was then reinserted in place of this monopolar scissors.  The Prograsp was then replaced with robotic tenaculum and using both the assistant, her tenaculum as well as  mine and to cautery and keeping, the specimen grounded to the patient's body, the fibroid was transected in 2 pieces and then the larger piece was then transected and then another half.  This allowed for the fibroid then to be removed, each piece was removed through the vagina.  Once this was done, the endometriotic implant was noted on the left pelvic brim was excised as was the stellate lesion that was noted in between the left uterosacral ligament and the ureter, this was also excised and removed and sent separately.  The hemostasis of the vaginal cuff with the bladder further displaced inferiorly resulted at 0 V-Loc suture being inserted and running suture of that was then done intraoperatively.  Palpation of the vaginal cuff showed well approximation and the procedure was then felt to be completed.  The abdomen was partially deflated in order to identify bleeding vessels, none were noted.  The abdomen was copiously irrigated and suctioned. With good hemostasis achieved, the robotic instruments were removed. The robot was undocked and then I went back sterilely to the patient's bedside.  The upper abdomen fluid was suctioned.  It was then noted that there was bleeding where the bladder area was, which had not been seen previously.  Using the [place], the bleeder was cauterized.  Good hemostasis was achieved.  The abdomen was then copiously irrigated and suctioned.  Ropivacaine was instilled in the abdomen for postop pain management.  The robotic ports were removed under direct visualization. The Air-Seal was turned off and then removed as per the instructions of the product.  The incisions were then closed with 4-0 Vicryl subcuticular stitch for the smaller site and the fascia was identified and 0 Vicryl figure-of-eight suture was then placed on the supraumbilical fascia and the skin  site was then approximated with 4-0 Vicryl subcuticular stitch.  I then went to the vagina and did  manual inspection.  I then notice some bleeding which  was from a tiny bleeding site noted in the left upper vagina.  Two 3-0 chromic figure-of- eight sutures was placed with good hemostasis.  The remaining vagina was unremarkable.  Uterus with cervix, tubes, and myoma in pieces sent  and the second specimen was the endometriotic implants from left pelvic brim and left posterior ovarian fossa endometriotic implant was the third specimen.  ESTIMATED BLOOD LOSS:  175 ml.  INTRAOPERATIVE FLUID:  1500 mL.  URINE OUTPUT:  350 mL.  COMPLICATION:  None.  The patient tolerated procedure well and was transferred to recovery room in stable condition.     Servando Salina, M.D.  [place]/MEDQ  D:  09/21/2014  T:  09/22/2014  Job:  [id number]

## 2014-10-02 ENCOUNTER — Ambulatory Visit: Payer: 59 | Admitting: Dietician

## 2015-06-08 ENCOUNTER — Other Ambulatory Visit: Payer: Self-pay | Admitting: Surgery

## 2015-06-08 DIAGNOSIS — Z9884 Bariatric surgery status: Secondary | ICD-10-CM

## 2015-06-11 ENCOUNTER — Other Ambulatory Visit: Payer: 59

## 2015-06-14 ENCOUNTER — Ambulatory Visit
Admission: RE | Admit: 2015-06-14 | Discharge: 2015-06-14 | Disposition: A | Discharge status: Home / Self Care | Payer: 59 | Source: Ambulatory Visit | Attending: Surgery | Admitting: Surgery

## 2015-06-14 DIAGNOSIS — Z9884 Bariatric surgery status: Secondary | ICD-10-CM

## 2015-08-02 ENCOUNTER — Ambulatory Visit: Payer: 59 | Admitting: Family Medicine

## 2015-08-04 ENCOUNTER — Encounter: Payer: Self-pay | Admitting: Family Medicine

## 2015-08-04 ENCOUNTER — Ambulatory Visit (INDEPENDENT_AMBULATORY_CARE_PROVIDER_SITE_OTHER): Payer: 59 | Admitting: Family Medicine

## 2015-08-04 VITALS — Ht 66.5 in | Wt 246.3 lb

## 2015-08-04 DIAGNOSIS — E669 Obesity, unspecified: Secondary | ICD-10-CM | POA: Diagnosis not present

## 2015-08-04 NOTE — Patient Instructions (Addendum)
-   See Dr. Dema Severin regarding your lower leg edema.   - Look into calorie amounts of favorite choc (& other) snacks.    GOALS:  - Challenge:  At least once a week, make some leafy green salad or cooked veg (i.e., stir-fried).   - Obtain twice as many veg's as protein or carbohydrate foods for both lunch and dinner. - Limit starchy foods to TWO per meal and ONE per snack.  You may opt for <2 portions, but do usually/always have at least 1 starch.     ONE portion of a starchy  food is equal to the following:   - ONE slice of bread (or its equivalent, such as half of a hamburger bun).   - 1/2 cup of a "scoopable" starchy food such as potatoes or rice.   - 15 grams of carbohydrate as shown on food label.   - Keep a food daily record, and review at least 3 X wk.    - Upon reviewing, if you see a regretted food choice, ask yourself, "Given the same circumstances, what might I do differently?"    - If you see a day of brilliant (supportive) food choices, figure out what helped set you up for success.    - Come to your follow-up appt prepared to discuss what you learned.   - Physical activity:  At least 30-60 min 3 X wk.    - DOCUMENT ACTIVITY!  Email Jeannie what the procedure is for pre-approval of your second visit.  Email progress report no later than 1/28.

## 2015-08-04 NOTE — Progress Notes (Signed)
Medical Nutrition Therapy:  Appt start time: 1330 end time:  1430.  Assessment:  Primary concerns today: Weight management.  Mary Morrison is a patient of Harlan Stains, MD.  She had lapband surgery in July 2015.  She lost only ~30 lb following surgery, but has been disappointed in the amount she has managed to lose, as well as in the weight she has regained.  After surgery, she was prescribed a 1200-kcal low-carb diet, which she found unsustainable.    She has lost weight using a [employer] plan in the past, and has found MyFitnessPal helpful.  She feels she needs a specific diet plan.  Needs something pretty convenient b/c she does not like to cook much.  She lives alone, and eats lunch out 2  X wk and dinner out 2-3 X wk.  Gets 6-7 hours of sleep per night.    Mary Morrison has problems with edema in her feet and lower legs.  Dr. Dema Severin prescribed Triamterene as a diuretic, but it has not seemed help.  She has had this problem most of her adult life.    Learning Readiness: Ready  Usual eating pattern includes 3 meals and 2-4 snacks per day. Frequent foods and beverages include water, 2% milk, boiled egg & tomato or yogurt with nuts for bkfst; lunch is often soup & salad if out or cheese toast.  Avoided foods include soda, turnips. Br sprts, broccoli (can't tolerate since lapband.  Also has some lapband problems with bread & rice.   Usual physical activity includes not much since knee surgery in Sept 2016.    24-hr recall: (Up at 7 AM) B (7 AM)-   3 c coffee, 3 hpng tsp of creamer B (8:30 AM)-   2 boiled eggs, 1 tomato Snk ( AM)-    L (12 PM)-  1/2 fried chx breast, 1 biscuit, tomato slc, 1/2 c grn beans, 1/2 c mashed pot's, 1/2 c gravy, water Snk (1 PM)-  2 pb balls, coffee D (7:30 PM)-  2 c venison stew, cornbread muffin, 1 T butter  Snk (9 PM)-  1/2 c choc chips in mixed nuts, 16 oz 2% milk Typical day? No. Had leftovers for lunch from Sunday meal.    Progress Towards Goal(s):  In  progress.   Nutritional Diagnosis:  [place]-3.3 Overweight/obesity As related to energy imbalance.  As evidenced by BMI >39.    Intervention:  Nutrition education.  Handouts given during visit include:  AVS  Demonstrated degree of understanding via:  Teach Back  Barriers to learning/adherence to lifestyle change: Appetite management has been difficult in past.    Monitoring/Evaluation:  Dietary intake, exercise, and body weight in 3 week(s).

## 2015-09-06 ENCOUNTER — Ambulatory Visit (INDEPENDENT_AMBULATORY_CARE_PROVIDER_SITE_OTHER): Payer: 59 | Admitting: Family Medicine

## 2015-09-06 ENCOUNTER — Encounter: Payer: Self-pay | Admitting: Family Medicine

## 2015-09-06 VITALS — Ht 66.5 in | Wt 246.5 lb

## 2015-09-06 DIAGNOSIS — E669 Obesity, unspecified: Secondary | ICD-10-CM

## 2015-09-06 NOTE — Patient Instructions (Signed)
-   Exercise is a celebration of what your body can do, NOT punishment for what you ate.    - Drop tracking calories!  - Document how much, how far, how long.   - Controlling portions:    - SEE all you plan to eat.      - Always plate your food.     - MEASURE your portions. - Review your food record:  Where can you make changes that were food choices not particularly supportive AND not especially pleasurable.    [id number] Card: Watch the video that explains the 4-step process.   Use the process as appropriate; WRITE at least some of your answers.   - Email Jeannie no later than Feb 20:  What has been your experience with this process?  - Go to Self-compassion.org, and take the quiz.  Report your total and sub-scores to [place].      - Share results with Florentina Jenny.

## 2015-09-06 NOTE — Progress Notes (Signed)
Medical Nutrition Therapy:  Appt start time: 0900 end time:  0930.  Assessment:  Primary concerns today: Weight management.  Mary Morrison was told by an insurance co rep that NO medical nutr therapy appts are covered by her policy unless she has DM or HTN, contrary to what she was initially told.  She is willing to pay out of pocket, but will not be able to come as frequently as hoped.    She completed food records for most of the days since last appt, which indicate a kcal intake of 1830 to 3262, averaging around 2500 kcal/day.  She has exercised ~3 X wk, which she stopped recording in MyFitnessPal when she realized the reported kcal expended tended to give her permission to eat more.    Mary Morrison has not been measuring foods, and she now realizes that she can't just snack on a full 4-oz can of nuts if she is to consume an appropriate number of kcal.  It was clear from Mary Morrison's food record that much of the extra kcal she consumes come from snacks.  We reviewed the use of the Urge911 Card today, as a means of interrupting undesirable food choices.  Using the 4-step process retrospectively, she struggled with naming thoughts, sensations, and feelings, but seemed enthusiastic to give this a try.    Progress Towards Goal(s):  In progress.   Nutritional Diagnosis:  [place]-3.3 Overweight/obesity As related to energy imbalance.  As evidenced by BMI >39.    Intervention:  Nutrition education.  Handouts given during visit include:  AVS  Demonstrated degree of understanding via:  Teach Back   Barriers to learning/adherence to lifestyle change: Identifying drivers of food choices has never been explored.      Monitoring/Evaluation:  Dietary intake, exercise, and body weight in 4 week(s).

## 2015-10-05 ENCOUNTER — Ambulatory Visit: Payer: 59 | Admitting: Family Medicine

## 2015-11-09 ENCOUNTER — Ambulatory Visit (INDEPENDENT_AMBULATORY_CARE_PROVIDER_SITE_OTHER): Payer: 59 | Admitting: Family Medicine

## 2015-11-09 ENCOUNTER — Encounter: Payer: Self-pay | Admitting: Family Medicine

## 2015-11-09 VITALS — Ht 66.5 in | Wt 238.2 lb

## 2015-11-09 DIAGNOSIS — E669 Obesity, unspecified: Secondary | ICD-10-CM | POA: Diagnosis not present

## 2015-11-09 NOTE — Progress Notes (Signed)
Medical Nutrition Therapy:  Appt start time: 0900 end time:  0930. PCP:  Harlan Stains, MD  Assessment:  Primary concerns today: Weight management.  Bertice started taking phentermine on 3/27, following a trip to [facility] with her sister, to whom she tends to compare herself.  (Her sister had recently lost a lot of weight.)  Arna feels it has been hepful to her with respect to evening eating.  Last Sunday, Jobeth saw her son, which is difficult for her b/c she has not always been accepting of his lifestyle.  Following this visit, Madelina noticed wanting to eat, even though she was not hungry.  She went ahead and ate in response to her urges, despite doing the Urge911 process to which she wrote answers.  It was clear from Vanshika's answers that she does not yet really understand how to use the process, and she did not have the written handout in front of her as she attempted to do it.  We re-did the process retrospectively today, which seems to have been useful in identifying what Lashannon was actually feeling and experiencing at the time.    She has been measuring and plating all of her foods.  Satomi has also been exercising at least three times a week since last appt.  Weight today was down >8 lb since 2 months ago.    24-hr recall:  (Up at 6 AM) B (10 AM)-  1 apple, 2 T pb, 3 c coffee, cream Snk ( AM)-   L (2 PM)-  [employer] [place] bacon egg & chs sandw   (230 kcal)  Snk ( PM)-   D (7:30 PM)-  1 slc quiche, salad, dressing, 1 c grapes Snk (9 PM)-  1/2 grpfruit, 1/4 bar dark choc (100 kcal), 1 c decaf, crm (35 kcal) Typical day? Yes.     Progress Towards Goal(s):  In progress.   Nutritional Diagnosis:  [place]-3.3 Overweight/obesity As related to energy imbalance.  As evidenced by BMI >39.    Intervention:  Nutrition education.  Handouts given during visit include:  AVS  Demonstrated degree of understanding via:  Teach Back   Barriers to learning/adherence to lifestyle change:  Identifying drivers of food choices has never been explored.      Monitoring/Evaluation:  Dietary intake, exercise, and body weight in 4 week(s).

## 2015-11-09 NOTE — Patient Instructions (Addendum)
-   Remember to use the Urge911 four-step process of challenging invasive thoughts:  (1) Name it; (2) Frame it; (3) Envision; and (4) Intentional focus shifting.  - Write your answers.    - Remember to do your absolute best to sit with the discomfort of your urge(s) b/c each time you resist, you strengthen your ability to resist the next time.    - Give some thought to a list of activities that "feed your soul," and write them down!  Also make arrangements to be able to DO what is on your list.     (Do an internet search for yoga from which you can determine your own yoga routine.)  - Continue with current food and activity goals:  - Eat at least 3 REAL meals and 1-2 snacks per day.  Aim for no more than 5 hours between eating.  Eat breakfast within one hour of getting up.   - Vegetables at both lunch and dinner.    - Exercise at least 30 min 3 X wk.  (More is better!)

## 2015-12-06 ENCOUNTER — Ambulatory Visit: Payer: 59 | Admitting: Family Medicine

## 2016-01-03 ENCOUNTER — Encounter: Payer: Self-pay | Admitting: Family Medicine

## 2016-01-03 ENCOUNTER — Ambulatory Visit (INDEPENDENT_AMBULATORY_CARE_PROVIDER_SITE_OTHER): Payer: 59 | Admitting: Family Medicine

## 2016-01-03 VITALS — Ht 66.5 in | Wt 233.2 lb

## 2016-01-03 DIAGNOSIS — E669 Obesity, unspecified: Secondary | ICD-10-CM | POA: Diagnosis not present

## 2016-01-03 NOTE — Progress Notes (Signed)
Medical Nutrition Therapy:  Appt start time: 0900 end time:  0900. PCP:  Harlan Stains, MD  Assessment:  Primary concerns today: Weight management.  Mary Morrison is still taking phentermine, although not always consistently.  She is tracking intake on "good" days only usually. She has been traveling some recently, so has found it difficult to be consistent with both diet and exercise.  She has been getting three meals a day most days.  When not traveling, [name] has been working a tremendous amount.  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    24-hr recall suggests intake of ~1400 kcal:  (Up at 5:30 AM; had coffee & crmr w/ sunrise at beach) B (9 AM)-  1 slc spin-chs quiche, 1 slc bacon, <1 c grapefrt sections, 2 c coffee, crmr 430 Snk ( AM)-  --- L (12:30 PM)-  Mini-bagel egg & bacon sandw, few pita chips, str chs, choc covd banana (100) 540 Snk (3 PM)-  1 c grapefrt sections         120 D (9 PM)-  2 c red pepper soup, parmesan chs, ~12 pita chips, 2 T hummus   400 Snk ( PM)-  --- Typical day? Yes.    Progress Towards Goal(s):  In progress.   Nutritional Diagnosis:  [place]-3.3 Overweight/obesity As related to energy imbalance.  As evidenced by BMI >39.    Intervention:  Nutrition education.  Handouts given during visit include:  AVS  Demonstrated degree of understanding via:  Teach Back   Barriers to learning/adherence to lifestyle change: Identifying drivers of food choices has never been explored.      Monitoring/Evaluation:  Dietary intake, exercise, and body weight in 5 week(s).

## 2016-01-03 NOTE — Patient Instructions (Addendum)
Food Goals: 1. Slow down eating pace (mindful eating):  - Shirlee [place] before eating: Gratitude for what we have and for the process of eating.    - Put fork down between bites.    - No drinking until food is swallowed.    - Reminders on the table: SLOW (whatever word helps you).  - Set an alarm to pause at times during the meal.   - Ideally, no distractions during meals.      - Track when you do this successfully.    - Check out [place].  2. Vegetables at least twice a day.   3. Physical activity: At least 30 minutes 3 X wk.    - Crucial at times you are especially busy:  Micro-breaks, i.e., 5 minutes for every hour you sit.    - Review & USE Urge process when you can.  The more often you use it, the better you get at it!  Be sure to use the handout to guide you.    (Check EOBs for 1/11, 2/13, 4/18, & 6/12.)

## 2016-02-04 ENCOUNTER — Encounter: Payer: Self-pay | Admitting: Genetic Counselor

## 2016-02-14 ENCOUNTER — Encounter: Payer: Self-pay | Admitting: Family Medicine

## 2016-02-14 ENCOUNTER — Ambulatory Visit (INDEPENDENT_AMBULATORY_CARE_PROVIDER_SITE_OTHER): Payer: 59 | Admitting: Family Medicine

## 2016-02-14 VITALS — Ht 66.5 in | Wt 240.4 lb

## 2016-02-14 DIAGNOSIS — E669 Obesity, unspecified: Secondary | ICD-10-CM | POA: Diagnosis not present

## 2016-02-14 NOTE — Patient Instructions (Signed)
Behaviors that will help advance your progress in weight loss: - Tracking food intake - Restart exercise  - Get moved & settled in new house  What will help you stay on track:  - Email Carla and/or Jeannie at times of need.  - Find a walking partner.   - Monitoring walking minutes where it shows.  (Get smiley face stickers for your calendar!)  Specific Goals: 1. Start tracking on MyFitnessPal starting today.   - Email Jeannie no later than Wed AND on Sat to report progress.   2. Complete your written to-do list for the move.    - Keep in mind that sometimes asking for help is a gift to those who help you.  3. Write minutes of exercise on your calendar even before you establish a consistent routine.

## 2016-02-14 NOTE — Progress Notes (Signed)
Medical Nutrition Therapy:  Appt start time: 0900 end time:  0900. PCP:  Harlan Stains, MD  Assessment:  Primary concerns today: Weight management.  Earlean has been extremely stressed recently, and has been eating for comfort.  She has also stopped taking her Phentermine.  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  She was not surprised to see a weight gain of 7 lb since last appt.    We discussed what she most needs at this stressful time.  Fatime feels that if she can just start tracking intake again, that awareness will be a big help to managing her choices.    Progress Towards Goal(s):  In progress.   Nutritional Diagnosis:  [place]-3.3 Overweight/obesity As related to energy imbalance.  As evidenced by BMI >35.    Intervention:  Nutrition education.  Handouts given during visit include:  AVS  Demonstrated degree of understanding via:  Teach Back   Barriers to learning/adherence to lifestyle change: Identifying drivers of food choices has never been explored.      Monitoring/Evaluation:  Dietary intake, exercise, and body weight in 10 week(s).

## 2016-04-24 ENCOUNTER — Ambulatory Visit: Payer: 59 | Admitting: Family Medicine

## 2016-05-02 ENCOUNTER — Other Ambulatory Visit: Payer: Self-pay | Admitting: Vascular Surgery

## 2016-05-02 DIAGNOSIS — I890 Lymphedema, not elsewhere classified: Secondary | ICD-10-CM

## 2016-06-23 ENCOUNTER — Encounter (HOSPITAL_COMMUNITY): Payer: 59

## 2016-06-23 ENCOUNTER — Encounter: Payer: 59 | Admitting: Vascular Surgery

## 2016-07-31 ENCOUNTER — Encounter: Payer: Self-pay | Admitting: Family Medicine

## 2016-07-31 ENCOUNTER — Ambulatory Visit (INDEPENDENT_AMBULATORY_CARE_PROVIDER_SITE_OTHER): Payer: 59 | Admitting: Family Medicine

## 2016-07-31 NOTE — Progress Notes (Signed)
Medical Nutrition Therapy:  Appt start time: 0900 end time:  0900. PCP:  Harlan Stains, MD  Assessment:  Primary concerns today: Weight management.  Mary Morrison took the [employer] wt management class last fall.  Unfortunately, she did not make it to two of the six classes, as this was a very stressful and busy time for her.    Mary Morrison has been sick for much of the holidays.  In addn, Mary Morrison was dealing with difficult family dynamics over the holidays, and has had significant problems with the house she bought last year.  She was following the [employer] packaged food plan with a health coach; dropped weight to ~219 lb by November.  This plan includes no more than 100 g carb daily, although she did not follow the plan precisely.  Her coach quit back in Dec, right around the time of her getting sick, and she has not exercised much since then.    Progress Towards Goal(s):  In progress.   Nutritional Diagnosis:  [place]-3.3 Overweight/obesity As related to energy imbalance.  As evidenced by BMI >35.    Intervention:  Nutrition education.  Handouts given during visit include:  AVS  Demonstrated degree of understanding via:  Teach Back   Barriers to learning/adherence to lifestyle change: Identifying drivers of food choices has never been explored.      Monitoring/Evaluation:  Dietary intake, exercise, and body weight in 5 week(s).

## 2016-07-31 NOTE — Patient Instructions (Addendum)
-   Tender your resignation from the [facility].    - Write down:  - What are all the reasons you want to take better care of yourself?  - What are the benefits of better self-care?  - Use the Urge process at times you have the inclination to make choices that are not in your best interest.  Use the handout format PRECISELY, and write down your answers!  Specific Behavioral Goals for the next week: 1. Record intake with MyFitnessPal.   2. Journal with Urge process as appropriate.   3. Get some physical activity (walking) at least 3 X wk, some sort of strength training 2 X wk, and yoga at last 1 X wk.  ANY amount of exercise counts.    (Book related to today's conversation: The Pocket Guide to the [employer]: [employer] of Feeling Safe by [employer].)

## 2016-08-17 ENCOUNTER — Encounter: Payer: 59 | Admitting: Vascular Surgery

## 2016-08-17 ENCOUNTER — Encounter (HOSPITAL_COMMUNITY): Payer: 59

## 2016-09-05 ENCOUNTER — Ambulatory Visit: Payer: 59 | Admitting: Family Medicine

## 2016-09-12 ENCOUNTER — Encounter: Payer: Self-pay | Admitting: Vascular Surgery

## 2016-09-15 ENCOUNTER — Ambulatory Visit (HOSPITAL_COMMUNITY)
Admission: RE | Admit: 2016-09-15 | Discharge: 2016-09-15 | Disposition: A | Discharge status: Home / Self Care | Payer: 59 | Source: Ambulatory Visit | Attending: Vascular Surgery | Admitting: Vascular Surgery

## 2016-09-15 ENCOUNTER — Encounter: Payer: Self-pay | Admitting: Vascular Surgery

## 2016-09-15 ENCOUNTER — Ambulatory Visit (INDEPENDENT_AMBULATORY_CARE_PROVIDER_SITE_OTHER): Payer: 59 | Admitting: Vascular Surgery

## 2016-09-15 VITALS — BP 130/88 | HR 89 | Resp 18 | Ht 66.5 in | Wt 233.8 lb

## 2016-09-15 DIAGNOSIS — I890 Lymphedema, not elsewhere classified: Secondary | ICD-10-CM | POA: Diagnosis not present

## 2016-09-15 DIAGNOSIS — M7989 Other specified soft tissue disorders: Secondary | ICD-10-CM | POA: Diagnosis not present

## 2016-09-15 DIAGNOSIS — I872 Venous insufficiency (chronic) (peripheral): Secondary | ICD-10-CM

## 2016-09-15 NOTE — Progress Notes (Signed)
Patient ID: Mary Morrison, female   DOB: 10-20-1965, 51 y.o.   MRN: [id number]  Reason for Consult: New Evaluation (eval bil lower extremity swelling - ref by Dr. Harlan Stains )   Referred by Harlan Stains, MD  Subjective:     HPI:  Mary Morrison is a 51 y.o. female with history of bilateral lower extremity swelling since her 45s. She states that when she was pregnant in her late 45s the swelling was actually worse. She does not have a history of DVT or any surgeries or injuries to her bilateral lower extremities. Swelling is worse when she's been on her feet and does improve with laying down and elevation. She has never tried compression stockings. She has never been on blood thinners. She does continue to ambulate and has actually completed in quite long-distance runs in the recent past. She is here today for evaluation of lower extremity venous disease. She does not have varicose veins but does have spider veins throughout both of her lower extremities. She is otherwise healthy and without changes in her medical history recently other than 10 pound weight gain following the loss of her dog recently.  Past Medical History:  Diagnosis Date  . Fibroids   . Obesity   . Seasonal allergies    Family History  Problem Relation Age of Onset  . Cancer Mother     breast/bilateral mastectomies  . Diabetes Other   . Hypertension Other   . Stroke Other   . Obesity Other    Past Surgical History:  Procedure Laterality Date  . BILATERAL SALPINGECTOMY Bilateral 09/21/2014   Procedure: BILATERAL SALPINGECTOMY;  Surgeon: Marvene Staff, MD;  Location: [facility];  Service: Gynecology;  Laterality: Bilateral;  . CESAREAN SECTION  1989  . COLONOSCOPY    . LAPAROSCOPIC GASTRIC BANDING N/A 02/17/2014   Procedure: LAPAROSCOPIC GASTRIC BANDING;  Surgeon: Pedro Earls, MD;  Location: [facility];  Service: General;  Laterality: N/A;  . ROBOT ASSISTED MYOMECTOMY N/A 09/21/2014   Procedure:  ROBOTIC ASSISTED MYOMECTOMY;  Surgeon: Marvene Staff, MD;  Location: [facility];  Service: Gynecology;  Laterality: N/A;  . ROBOTIC ASSISTED TOTAL HYSTERECTOMY N/A 09/21/2014   Procedure: ROBOTIC ASSISTED TOTAL HYSTERECTOMY, EXCISION OF PELVIC ENDOMETRIOSIS;  Surgeon: Marvene Staff, MD;  Location: [facility];  Service: Gynecology;  Laterality: N/A;  . TONSILLECTOMY      Short Social History:  Social History  Substance Use Topics  . Smoking status: Never Smoker  . Smokeless tobacco: Never Used  . Alcohol use No    No Known Allergies  Current Outpatient Prescriptions  Medication Sig Dispense Refill  . BuPROPion HCl (WELLBUTRIN PO) Take by mouth.    Marland Kitchen CALCIUM PO Take 1 tablet by mouth daily. Reported on 08/04/2015    . Glucosamine-Chondroitin (COSAMIN DS PO) Take by mouth.    Marland Kitchen ibuprofen (CHILDRENS MOTRIN) 100 MG/5ML suspension Take 40 mLs (800 mg total) by mouth every 6 (six) hours. 473 mL 2  . Melatonin 10 MG TABS Take 10 mg by mouth.    . Multiple Vitamins-Minerals (MULTIVITAMINS) CHEW Chew 1 tablet by mouth daily. Reported on 08/04/2015    . phentermine 37.5 MG capsule Take 37.5 mg by mouth every morning.    . Probiotic Product (PROBIOTIC PO) Take 1 capsule by mouth daily.    . traZODone (DESYREL) 50 MG tablet Take 100 mg by mouth at bedtime.     . TRIAMTERENE PO Take by mouth.     No current  facility-administered medications for this visit.     Review of Systems  Constitutional:  Constitutional negative. HENT: HENT negative.  Eyes: Eyes negative.  Respiratory: Respiratory negative.  Cardiovascular: Positive for leg swelling.  GI: Gastrointestinal negative.  GU:       Vaginal plaques Musculoskeletal: Musculoskeletal negative.  Skin: Skin negative.  Neurological: Neurological negative. Hematologic: Positive for bruises/bleeds easily.  Psychiatric: Psychiatric negative.        Objective:  Objective   Vitals:   09/15/16 1431 09/15/16 1434  BP: (!) 152/94 130/88    Pulse: 89   Resp: 18   SpO2: 98%   Weight: 233 lb 12.8 oz (106.1 kg)   Height: 5' 6.5" (1.689 m)    Body mass index is 37.17 kg/m.  Physical Exam  Constitutional: She is oriented to person, place, and time. She appears well-developed.  HENT:  Head: Normocephalic.  Eyes: Pupils are equal, round, and reactive to light.  Neck: Normal range of motion.  Cardiovascular: Normal rate.   Signals at bilateral dp/pt  Pulmonary/Chest: Effort normal.  Abdominal: Soft. She exhibits no mass.  Musculoskeletal: Normal range of motion.  2+ non pitting edema ble  Neurological: She is alert and oriented to person, place, and time.  Skin: Skin is warm and dry.  Spider veins notable on both legs  Psychiatric: She has a normal mood and affect. Her behavior is normal. Judgment and thought content normal.    Data: Venous incompetence noted in the bilateral saphenofemoral junctions and great saphenous veins with right 1.04 cm and left 0.64 cm. There is reflux throughout both GSV.      Assessment/Plan:     51 year old female with C3 venous disease. She has incompetent greater saphenous veins throughout. I discussed wearing thigh-high compression stockings which we will prescribe today continuing ambulation. We also discussed weight loss as a method to help her leg swelling.  She would possibly benefit from GSV ablation and we'll have her follow up in our vein center in 3 months time for discussion of this.    Waynetta Sandy MD [facility]

## 2016-09-19 ENCOUNTER — Encounter (HOSPITAL_COMMUNITY): Payer: Self-pay

## 2016-12-19 ENCOUNTER — Ambulatory Visit: Payer: 59 | Admitting: Vascular Surgery

## 2016-12-27 ENCOUNTER — Encounter: Payer: Self-pay | Admitting: Vascular Surgery

## 2017-01-02 ENCOUNTER — Ambulatory Visit: Payer: 59 | Admitting: Vascular Surgery

## 2017-01-25 ENCOUNTER — Ambulatory Visit (INDEPENDENT_AMBULATORY_CARE_PROVIDER_SITE_OTHER): Payer: 59 | Admitting: Orthopedic Surgery

## 2017-01-25 ENCOUNTER — Ambulatory Visit (INDEPENDENT_AMBULATORY_CARE_PROVIDER_SITE_OTHER): Payer: 59

## 2017-01-25 ENCOUNTER — Encounter (INDEPENDENT_AMBULATORY_CARE_PROVIDER_SITE_OTHER): Payer: Self-pay | Admitting: Orthopedic Surgery

## 2017-01-25 DIAGNOSIS — M1712 Unilateral primary osteoarthritis, left knee: Secondary | ICD-10-CM

## 2017-01-25 DIAGNOSIS — M25562 Pain in left knee: Secondary | ICD-10-CM | POA: Diagnosis not present

## 2017-01-25 DIAGNOSIS — M5441 Lumbago with sciatica, right side: Secondary | ICD-10-CM

## 2017-01-25 DIAGNOSIS — M25462 Effusion, left knee: Secondary | ICD-10-CM | POA: Diagnosis not present

## 2017-01-25 MED ORDER — BUPIVACAINE HCL 0.25 % IJ SOLN
4.0000 mL | INTRAMUSCULAR | Status: AC | PRN
  Administered 2017-01-25: 4 mL via INTRA_ARTICULAR

## 2017-01-25 MED ORDER — METHYLPREDNISOLONE ACETATE 40 MG/ML IJ SUSP
40.0000 mg | INTRAMUSCULAR | Status: AC | PRN
  Administered 2017-01-25: 40 mg via INTRA_ARTICULAR

## 2017-01-25 MED ORDER — LIDOCAINE HCL 1 % IJ SOLN
5.0000 mL | INTRAMUSCULAR | Status: AC | PRN
  Administered 2017-01-25: 5 mL

## 2017-01-25 NOTE — Progress Notes (Signed)
Office Visit Note   Patient: Mary Morrison           Date of Birth: [birth year]           MRN: [id number] Visit Date: 01/25/2017 Requested by: Harlan Stains, MD [place], [place] [id number] PCP: Harlan Stains, MD  Subjective: Chief Complaint  Patient presents with  . Left Knee - Pain, Edema    [id number] is a 51 year old female with left knee pain.  She reports left knee pain and swelling and difficulty weightbearing.  Been going on for a week.  She had arthroscopic surgery in 2015 with partial medial meniscectomy.  Takes Aleve 2 times a day.  States that "my whole knee hurts".  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  She had LAP-BAND surgery in 2015 which was not helpful.  She has a lot of athletic things in her life such as triathlons marathons and bike races.  Patient also describes low back pain and right-sided radicular pain.  Denies any groin pain in the right hip but does report pain radiating down the right leg.  Occasionally will get down to the ankle.  Has some numbness associated with that.  She feels very off tilt or because of the right-sided gluteal and sciatic notch type pain With the left knee pain             ROS: All systems reviewed are negative as they relate to the chief complaint within the history of present illness.  Patient denies  fevers or chills.   Assessment & Plan: Visit Diagnoses:  1. Acute pain of left knee   2. Swelling of left knee joint   3. Right-sided low back pain with right-sided sciatica, unspecified chronicity     Plan: Impression is left knee pain and end-stage medial compartment osteoarthritis by radiographs.  Plan is aspiration injection into the left knee.  She'll also need an MRI scan on her lumbar spine because of several month history of low back pain and radiating right leg pain.  I think she has facet arthritis based on plain radiographs.  I'll see her back  after the MRI scan at which time we'll likely schedule her for some epidural steroid injections.  Follow-Up Instructions: No Follow-up on file.   Orders:  Orders Placed This Encounter  Procedures  . XR KNEE 3 VIEW LEFT  . XR Lumbar Spine 2-3 Views   No orders of the defined types were placed in this encounter.     Procedures: Large Joint Inj Date/Time: 01/25/2017 5:36 PM Performed by: Meredith Pel Authorized by: Meredith Pel   Consent Given by:  Patient Site marked: the procedure site was marked   Timeout: prior to procedure the correct patient, procedure, and site was verified   Indications:  Pain, joint swelling and diagnostic evaluation Location:  Knee Site:  L knee Prep: patient was prepped and draped in usual sterile fashion   Needle Size:  18 G Needle Length:  1.5 inches Approach:  Superolateral Ultrasound Guidance: No   Fluoroscopic Guidance: No   Arthrogram: No   Medications:  5 mL lidocaine 1 %; 4 mL bupivacaine 0.25 %; 40 mg methylPREDNISolone acetate 40 MG/ML Patient tolerance:  Patient tolerated the procedure well with no immediate complications     Clinical Data: No additional findings.  Objective: Vital Signs: LMP 02/10/2014   Physical Exam:   Constitutional: Patient  appears well-developed HEENT:  Head: Normocephalic Eyes:EOM are normal Neck: Normal range of motion Cardiovascular: Normal rate Pulmonary/chest: Effort normal Neurologic: Patient is alert Skin: Skin is warm Psychiatric: Patient has normal mood and affect    Ortho Exam: Orthopedic exam demonstrates varus alignment bilateral lower extremities.  Trace effusion in the left knee no effusion in the right.  She does have a developing flexion contracture and left knee of about 10.  Right knee is straight.  Collateral and cruciate ligaments are stable on the left knee.  Pedal pulses palpable.  No groin pain with internal/external rotation of the hip.  She has no nerve  retention signs on the right but does have some paresthesias in the L4 distribution on the right compared to left.  Negative Babinski negative clonus she has 5 out of 5 ankle dorsi flexion plantar flexion quite hamstring strength.  Specialty Comments:  No specialty comments available.  Imaging: Xr Knee 3 View Left  Result Date: 01/25/2017 AP lateral merchant view left knee reviewed.  End-stage medial compartment arthritis is present in the left knee.  Patellas are well centered in the trochlear groove posterior medial femoral osteophytes noted on the lateral.  Xr Lumbar Spine 2-3 Views  Result Date: 01/25/2017 AP lateral lumbar spine reviewed.  Left band procedure hardware is visible in the abdominal region.  No hip arthritis is present.  The visualized bony pelvis otherwise normal.  Minimal degenerative disc space disease between the vertebral bodies.  Facet arthritis is present.  No spondylolisthesis or compression fractures    PMFS History: Patient Active Problem List   Diagnosis Date Noted  . Acute pain of left knee 01/25/2017  . Swelling of left knee joint 01/25/2017  . S/P hysterectomy 09/21/2014  . Lapband APS July 2015 02/17/2014  . Edema-chronic pitting in legs 01/20/2014  . Morbid obesity ([place]) 08/28/2013   Past Medical History:  Diagnosis Date  . Fibroids   . Obesity   . Seasonal allergies     Family History  Problem Relation Age of Onset  . Cancer Mother        breast/bilateral mastectomies  . Diabetes Other   . Hypertension Other   . Stroke Other   . Obesity Other     Past Surgical History:  Procedure Laterality Date  . BILATERAL SALPINGECTOMY Bilateral 09/21/2014   Procedure: BILATERAL SALPINGECTOMY;  Surgeon: Marvene Staff, MD;  Location: [facility];  Service: Gynecology;  Laterality: Bilateral;  . CESAREAN SECTION  1989  . COLONOSCOPY    . LAPAROSCOPIC GASTRIC BANDING N/A 02/17/2014   Procedure: LAPAROSCOPIC GASTRIC BANDING;  Surgeon: Pedro Earls,  MD;  Location: [facility];  Service: General;  Laterality: N/A;  . ROBOT ASSISTED MYOMECTOMY N/A 09/21/2014   Procedure: ROBOTIC ASSISTED MYOMECTOMY;  Surgeon: Marvene Staff, MD;  Location: [facility];  Service: Gynecology;  Laterality: N/A;  . ROBOTIC ASSISTED TOTAL HYSTERECTOMY N/A 09/21/2014   Procedure: ROBOTIC ASSISTED TOTAL HYSTERECTOMY, EXCISION OF PELVIC ENDOMETRIOSIS;  Surgeon: Marvene Staff, MD;  Location: [facility];  Service: Gynecology;  Laterality: N/A;  . TONSILLECTOMY     Social History   Occupational History  . Not on file.   Social History Main Topics  . Smoking status: Never Smoker  . Smokeless tobacco: Never Used  . Alcohol use No  . Drug use: No  . Sexual activity: Not Currently    Birth control/ protection: None

## 2017-01-26 ENCOUNTER — Encounter: Payer: Self-pay | Admitting: Vascular Surgery

## 2017-02-06 ENCOUNTER — Ambulatory Visit (INDEPENDENT_AMBULATORY_CARE_PROVIDER_SITE_OTHER): Payer: 59 | Admitting: Vascular Surgery

## 2017-02-06 ENCOUNTER — Encounter: Payer: Self-pay | Admitting: Vascular Surgery

## 2017-02-06 VITALS — BP 139/86 | HR 76 | Temp 97.5°F | Resp 18 | Ht 66.5 in | Wt 249.0 lb

## 2017-02-06 DIAGNOSIS — I872 Venous insufficiency (chronic) (peripheral): Secondary | ICD-10-CM

## 2017-02-06 DIAGNOSIS — I868 Varicose veins of other specified sites: Secondary | ICD-10-CM

## 2017-02-06 NOTE — Progress Notes (Signed)
[facility]ist [facility]  Patient name: Mary Morrison MRN: [id number] DOB: [birth year] Sex: female  REASON FOR VISIT: Follow-up bilateral superficial venous hypertension  HPI: Mary Morrison is a 51 y.o. female here today for follow-up. She has been compliant with her compression garments and elevation ibuprofen for discomfort. Continues to have significant bilateral lower extremity swelling. This does interrupt her ability to do her daily activities including managing then breakfast in her home. She continues to have no history of DVT.  Past Medical History:  Diagnosis Date  . Fibroids   . Obesity   . Seasonal allergies     Family History  Problem Relation Age of Onset  . Cancer Mother        breast/bilateral mastectomies  . Diabetes Other   . Hypertension Other   . Stroke Other   . Obesity Other     SOCIAL HISTORY: Social History  Substance Use Topics  . Smoking status: Never Smoker  . Smokeless tobacco: Never Used  . Alcohol use No    No Known Allergies  Current Outpatient Prescriptions  Medication Sig Dispense Refill  . buPROPion (WELLBUTRIN XL) 300 MG 24 hr tablet     . BuPROPion HCl (WELLBUTRIN PO) Take by mouth.    Marland Kitchen CALCIUM PO Take 1 tablet by mouth daily. Reported on 08/04/2015    . Glucosamine-Chondroitin (COSAMIN DS PO) Take by mouth.    Marland Kitchen ibuprofen (CHILDRENS MOTRIN) 100 MG/5ML suspension Take 40 mLs (800 mg total) by mouth every 6 (six) hours. 473 mL 2  . Melatonin 10 MG TABS Take 10 mg by mouth.    . Multiple Vitamins-Minerals (MULTIVITAMINS) CHEW Chew 1 tablet by mouth daily. Reported on 08/04/2015    . phentermine 37.5 MG capsule Take 37.5 mg by mouth every morning.    . Probiotic Product (PROBIOTIC PO) Take 1 capsule by mouth daily.    . traZODone (DESYREL) 50 MG tablet Take 100 mg by mouth at bedtime.     . TRIAMTERENE PO Take by mouth.    . triamterene-hydrochlorothiazide (MAXZIDE-25)  37.5-25 MG tablet TAKE 1 TABLET BY MOUTH ONCE A DAY IN THE MORNING  0   No current facility-administered medications for this visit.     REVIEW OF SYSTEMS:  [X]  denotes positive finding, [ ]  denotes negative finding Cardiac  Comments:  Chest pain or chest pressure:    Shortness of breath upon exertion:    Short of breath when lying flat:    Irregular heart rhythm:        Vascular    Pain in calf, thigh, or hip brought on by ambulation:    Pain in feet at night that wakes you up from your sleep:     Blood clot in your veins:    Leg swelling:  x         PHYSICAL EXAM: Vitals:   02/06/17 1058  BP: 139/86  Pulse: 76  Resp: 18  Temp: (!) 97.5 F (36.4 C)  SpO2: 99%  Weight: 249 lb (112.9 kg)  Height: 5' 6.5" (1.689 m)    GENERAL: The patient is a well-nourished female, in no acute distress. The vital signs are documented above. CARDIOVASCULAR: Easily palpable dorsalis pedis pulses bilaterally. Marked telangiectasia of both lower extremities. PULMONARY: There is good air exchange  MUSCULOSKELETAL: There are no major deformities or cyanosis. NEUROLOGIC: No focal weakness or paresthesias are detected. SKIN: There are no ulcers or rashes noted. PSYCHIATRIC: The patient has a normal affect.  DATA:  Reviewed her formal venous duplex from 09/15/2016. This does show reflux in her saphenofemoral vein and throughout her saphenous vein with enlargement bilaterally. Minimal deep venous reflux  MEDICAL ISSUES: I do think she would be an excellent candidate for staged bilateral great saphenous vein ablation for reduction of her venous hypertension. This is limited to her surface veins and should be corrected. Also would recommend sclerotherapy to painful telangiectasia bilaterally. She understands this would be a staged procedure. She wished to proceed as soon as possible    Rosetta Posner, MD [facility] Tel [phone] Pager [phone]

## 2017-02-08 ENCOUNTER — Telehealth (INDEPENDENT_AMBULATORY_CARE_PROVIDER_SITE_OTHER): Payer: Self-pay | Admitting: Orthopedic Surgery

## 2017-02-08 NOTE — Telephone Encounter (Signed)
Returned call to patient left message that I was canceling her appointment until she has her scan. [phone]

## 2017-02-12 ENCOUNTER — Ambulatory Visit (INDEPENDENT_AMBULATORY_CARE_PROVIDER_SITE_OTHER): Payer: 59 | Admitting: Orthopedic Surgery

## 2017-02-21 ENCOUNTER — Other Ambulatory Visit: Payer: Self-pay | Admitting: *Deleted

## 2017-02-21 DIAGNOSIS — I83891 Varicose veins of right lower extremities with other complications: Secondary | ICD-10-CM

## 2017-02-21 DIAGNOSIS — I83893 Varicose veins of bilateral lower extremities with other complications: Secondary | ICD-10-CM

## 2017-02-26 ENCOUNTER — Ambulatory Visit
Admission: RE | Admit: 2017-02-26 | Discharge: 2017-02-26 | Disposition: A | Discharge status: Home / Self Care | Payer: 59 | Source: Ambulatory Visit | Attending: Orthopedic Surgery | Admitting: Orthopedic Surgery

## 2017-02-26 DIAGNOSIS — M5126 Other intervertebral disc displacement, lumbar region: Secondary | ICD-10-CM | POA: Diagnosis not present

## 2017-02-26 DIAGNOSIS — M5441 Lumbago with sciatica, right side: Secondary | ICD-10-CM

## 2017-03-06 ENCOUNTER — Encounter: Payer: Self-pay | Admitting: Vascular Surgery

## 2017-03-09 ENCOUNTER — Encounter: Payer: Self-pay | Admitting: Vascular Surgery

## 2017-03-15 ENCOUNTER — Encounter: Payer: Self-pay | Admitting: Vascular Surgery

## 2017-03-15 ENCOUNTER — Ambulatory Visit (INDEPENDENT_AMBULATORY_CARE_PROVIDER_SITE_OTHER): Payer: 59 | Admitting: Vascular Surgery

## 2017-03-15 VITALS — BP 128/84 | HR 92 | Temp 97.6°F | Resp 16 | Ht 66.5 in | Wt 250.0 lb

## 2017-03-15 DIAGNOSIS — I872 Venous insufficiency (chronic) (peripheral): Secondary | ICD-10-CM

## 2017-03-15 NOTE — Progress Notes (Signed)
     Laser Ablation Procedure    Date: 03/15/2017   Mary Morrison DOB:Jun 27, 1966  Consent signed: Yes    Surgeon:  Dr. Sherren Mocha Early  Procedure: Laser Ablation: left Greater Saphenous Vein  BP 128/84   Pulse 92   Temp 97.6 F (36.4 C)   Resp 16   Ht 5' 6.5" (1.689 m)   Wt 250 lb (113.4 kg)   LMP 02/10/2014   SpO2 98%   BMI 39.75 kg/m   Tumescent Anesthesia: 480 cc 0.9% NaCl with 50 cc Lidocaine HCL with 1% Epi and 15 cc 8.4% NaHCO3  Local Anesthesia: 5 cc Lidocaine HCL and NaHCO3 (ratio 2:1)  15 watts continuous mode        Total energy: 2757   Total time: 3:03    Patient tolerated procedure well  Notes:   Description of Procedure:  After marking the course of the secondary varicosities, the patient was placed on the operating table in the supine position, and the left leg was prepped and draped in sterile fashion.   Local anesthetic was administered and under ultrasound guidance the saphenous vein was accessed with a micro needle and guide wire; then the mirco puncture sheath was placed.  A guide wire was inserted saphenofemoral junction , followed by a 5 french sheath.  The position of the sheath and then the laser fiber below the junction was confirmed using the ultrasound.  Tumescent anesthesia was administered along the course of the saphenous vein using ultrasound guidance. The patient was placed in Trendelenburg position and protective laser glasses were placed on patient and staff, and the laser was fired at 15 watts continuous mode advancing 1-2mm/second for a total of 2757 joules.   Steri strips were applied to the stab wounds and ABD pads and thigh high compression stockings were applied.  Ace wrap bandages were applied over the phlebectomy sites and at the top of the saphenofemoral junction. Blood loss was less than 15 cc.  The patient ambulated out of the operating room having tolerated the procedure well.  Uneventful ablation from mid calf to just below the  saphenofemoral junction. Will be seen in one week for follow-up duplex. Will then be scheduled for similar treatment to her right leg

## 2017-03-19 ENCOUNTER — Encounter: Payer: Self-pay | Admitting: Vascular Surgery

## 2017-03-20 ENCOUNTER — Ambulatory Visit (HOSPITAL_COMMUNITY)
Admission: RE | Admit: 2017-03-20 | Discharge: 2017-03-20 | Disposition: A | Discharge status: Home / Self Care | Payer: 59 | Source: Ambulatory Visit | Attending: Vascular Surgery | Admitting: Vascular Surgery

## 2017-03-20 ENCOUNTER — Ambulatory Visit (INDEPENDENT_AMBULATORY_CARE_PROVIDER_SITE_OTHER): Payer: 59 | Admitting: Vascular Surgery

## 2017-03-20 ENCOUNTER — Encounter: Payer: Self-pay | Admitting: Vascular Surgery

## 2017-03-20 VITALS — BP 121/81 | HR 72 | Temp 97.2°F | Resp 18 | Ht 66.5 in | Wt 250.0 lb

## 2017-03-20 DIAGNOSIS — I824Z2 Acute embolism and thrombosis of unspecified deep veins of left distal lower extremity: Secondary | ICD-10-CM | POA: Insufficient documentation

## 2017-03-20 DIAGNOSIS — I83891 Varicose veins of right lower extremities with other complications: Secondary | ICD-10-CM

## 2017-03-20 DIAGNOSIS — I83893 Varicose veins of bilateral lower extremities with other complications: Secondary | ICD-10-CM | POA: Diagnosis not present

## 2017-03-20 DIAGNOSIS — I868 Varicose veins of other specified sites: Secondary | ICD-10-CM

## 2017-03-20 NOTE — Progress Notes (Signed)
Subjective:     Patient ID: Mary Morrison, female   DOB: 12-31-1965, 51 y.o.   MRN: [id number]  HPI This 51 year old female returns 1 week post-laser ablation left great saphenous vein by Dr. early. Patient had documented gross reflux bilaterally with bilateral edema and pain. She has worn her long leg elastic compression stocking and taken ibuprofen as instructed. She does not elevate her legs at night. She has had some difficulty in the past few days getting the stocking on. She had a moderate amount of discomfort in the medial and proximal thigh area which has now improved significantly. Past Medical History:  Diagnosis Date  . Fibroids   . Obesity   . Seasonal allergies     Social History  Substance Use Topics  . Smoking status: Never Smoker  . Smokeless tobacco: Never Used  . Alcohol use No    Family History  Problem Relation Age of Onset  . Cancer Mother        breast/bilateral mastectomies  . Diabetes Other   . Hypertension Other   . Stroke Other   . Obesity Other     No Known Allergies   Current Outpatient Prescriptions:  .  buPROPion (WELLBUTRIN XL) 300 MG 24 hr tablet, , Disp: , Rfl:  .  BuPROPion HCl (WELLBUTRIN PO), Take by mouth., Disp: , Rfl:  .  CALCIUM PO, Take 1 tablet by mouth daily. Reported on 08/04/2015, Disp: , Rfl:  .  Glucosamine-Chondroitin (COSAMIN DS PO), Take by mouth., Disp: , Rfl:  .  ibuprofen (CHILDRENS MOTRIN) 100 MG/5ML suspension, Take 40 mLs (800 mg total) by mouth every 6 (six) hours., Disp: 473 mL, Rfl: 2 .  Melatonin 10 MG TABS, Take 10 mg by mouth., Disp: , Rfl:  .  Multiple Vitamins-Minerals (MULTIVITAMINS) CHEW, Chew 1 tablet by mouth daily. Reported on 08/04/2015, Disp: , Rfl:  .  phentermine 37.5 MG capsule, Take 37.5 mg by mouth every morning., Disp: , Rfl:  .  Probiotic Product (PROBIOTIC PO), Take 1 capsule by mouth daily., Disp: , Rfl:  .  traZODone (DESYREL) 50 MG tablet, Take 100 mg by mouth at bedtime. , Disp: , Rfl:  .   TRIAMTERENE PO, Take by mouth., Disp: , Rfl:  .  triamterene-hydrochlorothiazide (MAXZIDE-25) 37.5-25 MG tablet, TAKE 1 TABLET BY MOUTH ONCE A DAY IN THE MORNING, Disp: , Rfl: 0  Vitals:   03/20/17 1112  BP: 121/81  Pulse: 72  Resp: 18  Temp: (!) 97.2 F (36.2 C)  TempSrc: Oral  SpO2: 96%  Weight: 250 lb (113.4 kg)  Height: 5' 6.5" (1.689 m)    Body mass index is 39.75 kg/m.         Review of Systems Denies chest pain, dyspnea on exertion, PND, orthopnea, hemoptysis    Objective:   Physical Exam BP 121/81 (BP Location: Left Arm, Patient Position: Sitting, Cuff Size: Large)   Pulse 72   Temp (!) 97.2 F (36.2 C) (Oral)   Resp 18   Ht 5' 6.5" (1.689 m)   Wt 250 lb (113.4 kg)   LMP 02/10/2014   SpO2 96%   BMI 39.75 kg/m   Gen. well-developed well-nourished obese female no apparent distress alert and oriented 3 Lungs no rhonchi or wheezing Left leg with mild to moderate ecchymosis in the medial thigh overlying great saphenous vein. Mild tenderness to deep palpation over great saphenous vein. 1+ distal edema. 3 posterior cells pedis pulse palpable.  Today I ordered a venous duplex exam  of the left leg which I reviewed and interpreted. There is no DVT. There is total closure of the left great saphenous vein from the proximal calf to near the saphenofemoral junction     Assessment:     Successful laser ablation left great saphenous vein for gross reflux in patient with pain and swelling    Plan:     Patient to return next month for same procedure in  contralateral right leg

## 2017-03-21 ENCOUNTER — Ambulatory Visit (INDEPENDENT_AMBULATORY_CARE_PROVIDER_SITE_OTHER): Payer: 59

## 2017-03-21 ENCOUNTER — Ambulatory Visit (INDEPENDENT_AMBULATORY_CARE_PROVIDER_SITE_OTHER): Payer: 59 | Admitting: Orthopedic Surgery

## 2017-03-21 ENCOUNTER — Encounter (INDEPENDENT_AMBULATORY_CARE_PROVIDER_SITE_OTHER): Payer: Self-pay | Admitting: Orthopedic Surgery

## 2017-03-21 DIAGNOSIS — G8929 Other chronic pain: Secondary | ICD-10-CM

## 2017-03-21 DIAGNOSIS — M25572 Pain in left ankle and joints of left foot: Secondary | ICD-10-CM

## 2017-03-21 DIAGNOSIS — M5440 Lumbago with sciatica, unspecified side: Secondary | ICD-10-CM

## 2017-03-21 DIAGNOSIS — M1712 Unilateral primary osteoarthritis, left knee: Secondary | ICD-10-CM

## 2017-03-22 NOTE — Progress Notes (Signed)
Office Visit Note   Patient: Mary Morrison           Date of Birth: Aug 09, 1965           MRN: [id number] Visit Date: 03/21/2017 Requested by: Harlan Stains, MD [place], [place] [address] PCP: Harlan Stains, MD  Subjective: Chief Complaint  Patient presents with  . Lower Back - Follow-up  . 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  Since I have seen her she has had MRI scan of the lumbar spine.  She has pain radiating into the right buttock and leg region.  MRI scan shows at L5-S1 a subligamentous disc protrusion.  Patient also describes continued and incapacitating left knee pain.  She has known history of left knee arthritis.  She is getting to the point where she can't really live with the pain anymore.  She's had full course of conservative treatment.  Patient also describes left foot pain.  She is unsure of injury.  She did have a vein ablation on Thursday of last week and she is wearing compression hose for this problem.  The pain started after that event.  She describes pain on the lateral aspect of the foot.  It is difficult for her to weight bear.  She's been taking Motrin for her symptoms.  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   HPI: See above              ROS: All systems reviewed are negative as they relate to the chief complaint within the history of present illness.  Patient denies  fevers or chills.   Assessment & Plan: Visit Diagnoses:  1. Pain in left ankle and joints of left foot   2. Chronic low back pain with sciatica, sciatica laterality unspecified, unspecified back pain laterality     Plan: Impression is endstage left knee arthritis.  Plan left total knee replacement.  Risks and benefits are discussed.  They include but are not limited to infection or vessel damage incomplete pain relief as well as potential for revision.  Patient  understands risks and benefits and wishes to proceed.  In regards to the foot I think she may have a stress reaction with the calcification noted at the distal metatarsal #4.  A like to put her in a fracture boot for that for at least 4 weeks and that should improve.  In regards to her back she does have a subligamentous disc protrusion at L5-S1.  I like to send her to Dr. Ernestina Patches for consideration of epidural steroid injection into her lumbar spine.  I'll also like to send her to physical therapy for back and knee quadriceps strengthening.  I'll see her back 14 days after surgery in December  Follow-Up Instructions: No Follow-up on file.   Orders:  Orders Placed This Encounter  Procedures  . XR Foot Complete Left  . Ambulatory referral to Physical Medicine Rehab   No orders of the defined types were placed in this encounter.     Procedures: No procedures performed   Clinical Data: No additional findings.  Objective: Vital Signs: LMP 02/10/2014   Physical Exam:   Constitutional: Patient appears well-developed HEENT:  Head: Normocephalic Eyes:EOM are normal Neck: Normal range of motion Cardiovascular: Normal rate Pulmonary/chest: Effort normal Neurologic: Patient is alert Skin: Skin is warm Psychiatric: Patient has normal mood and affect  Ortho Exam: Orthopedic exam demonstrates pain with forward lateral bending with mildly positive nerve retention signs on the right negative on the left.  Pedal pulses palpable.  No other masses lymph adenopathy or skin changes noted in the right leg region.  On the left foot patient has swelling around the metatarsal region.  Pedal pulses palpable.  Extensor mechanism is intact.  Ankle dorsiflexion plantar flexion quite hamstring strength also intact.  Patient does have tenderness and pain to direct palpation over the fourth metatarsal head region.  Negative Mulder's click.  No real numbness in the toes.  Left knee has varus alignment mild  effusion 5-10 flexion contracture intact extensor mechanism no other masses lymph adenopathy or skin changes in the knee region  Specialty Comments:  No specialty comments available.  Imaging: No results found.   PMFS History: Patient Active Problem List   Diagnosis Date Noted  . Varicose veins of bilateral lower extremities with other complications 46/65/9935  . Acute pain of left knee 01/25/2017  . Swelling of left knee joint 01/25/2017  . S/P hysterectomy 09/21/2014  . Lapband APS July 2015 02/17/2014  . Edema-chronic pitting in legs 01/20/2014  . Morbid obesity ([place]) 08/28/2013   Past Medical History:  Diagnosis Date  . Fibroids   . Obesity   . Seasonal allergies     Family History  Problem Relation Age of Onset  . Cancer Mother        breast/bilateral mastectomies  . Diabetes Other   . Hypertension Other   . Stroke Other   . Obesity Other     Past Surgical History:  Procedure Laterality Date  . BILATERAL SALPINGECTOMY Bilateral 09/21/2014   Procedure: BILATERAL SALPINGECTOMY;  Surgeon: Marvene Staff, MD;  Location: [facility];  Service: Gynecology;  Laterality: Bilateral;  . CESAREAN SECTION  1989  . COLONOSCOPY    . LAPAROSCOPIC GASTRIC BANDING N/A 02/17/2014   Procedure: LAPAROSCOPIC GASTRIC BANDING;  Surgeon: Pedro Earls, MD;  Location: [facility];  Service: General;  Laterality: N/A;  . ROBOT ASSISTED MYOMECTOMY N/A 09/21/2014   Procedure: ROBOTIC ASSISTED MYOMECTOMY;  Surgeon: Marvene Staff, MD;  Location: [facility];  Service: Gynecology;  Laterality: N/A;  . ROBOTIC ASSISTED TOTAL HYSTERECTOMY N/A 09/21/2014   Procedure: ROBOTIC ASSISTED TOTAL HYSTERECTOMY, EXCISION OF PELVIC ENDOMETRIOSIS;  Surgeon: Marvene Staff, MD;  Location: [facility];  Service: Gynecology;  Laterality: N/A;  . TONSILLECTOMY     Social History   Occupational History  . Not on file.   Social History Main Topics  . Smoking status: Never Smoker  . Smokeless tobacco: Never  Used  . Alcohol use No  . Drug use: No  . Sexual activity: Not Currently    Birth control/ protection: None

## 2017-04-03 ENCOUNTER — Ambulatory Visit (INDEPENDENT_AMBULATORY_CARE_PROVIDER_SITE_OTHER): Payer: 59

## 2017-04-03 ENCOUNTER — Encounter (INDEPENDENT_AMBULATORY_CARE_PROVIDER_SITE_OTHER): Payer: Self-pay | Admitting: Physical Medicine and Rehabilitation

## 2017-04-03 ENCOUNTER — Ambulatory Visit (INDEPENDENT_AMBULATORY_CARE_PROVIDER_SITE_OTHER): Payer: 59 | Admitting: Physical Medicine and Rehabilitation

## 2017-04-03 VITALS — BP 130/85 | HR 87 | Temp 98.0°F

## 2017-04-03 DIAGNOSIS — M5416 Radiculopathy, lumbar region: Secondary | ICD-10-CM

## 2017-04-03 DIAGNOSIS — M5116 Intervertebral disc disorders with radiculopathy, lumbar region: Secondary | ICD-10-CM

## 2017-04-03 MED ORDER — BETAMETHASONE SOD PHOS & ACET 6 (3-3) MG/ML IJ SUSP
12.0000 mg | Freq: Once | INTRAMUSCULAR | Status: AC
  Administered 2017-04-03: 12 mg

## 2017-04-03 MED ORDER — LIDOCAINE HCL (PF) 1 % IJ SOLN
2.0000 mL | Freq: Once | INTRAMUSCULAR | Status: AC
  Administered 2017-04-03: 2 mL

## 2017-04-03 NOTE — Progress Notes (Deleted)
Right side lower back pain into right buttock. Down back of leg to knee and then around knee and down front of leg to foot. Numbness and tingling in lower leg at times.

## 2017-04-03 NOTE — Patient Instructions (Signed)

## 2017-04-10 NOTE — Procedures (Signed)
[name] is a 51 year old female with chronic orthopedic complaints followed by Dr. Marlou Sa. She's been having right lower back pain referring into the buttock and down the posterior lateral leg to the knee in a mostly S1 distribution. She gets some pain wrap around the knee into the front of the foot and leg in L5 distribution. She gets numbness and tingling as well as times. Dr. Marlou Sa has obtained an MRI of the lumbar spine. This is reviewed below and reviewed with the patient today. She does have paracentral disc bulging with close contact of the descending L5 and S1 nerve roots.The injection  will be diagnostic and hopefully therapeutic. The patient has failed conservative care including time, medications and activity modification.  Lumbar Epidural Steroid Injection - Interlaminar Approach with Fluoroscopic Guidance  Patient: Mary Morrison      Date of Birth: 1965-08-20 MRN: [id number] PCP: Harlan Stains, MD      Visit Date: 04/03/2017   Universal Protocol:     Consent Given By: the patient  Position: PRONE  Additional Comments: Vital signs were monitored before and after the procedure. Patient was prepped and draped in the usual sterile fashion. The correct patient, procedure, and site was verified.   Injection Procedure Details:  Procedure Site One Meds Administered:  Meds ordered this encounter  Medications  . lidocaine (PF) (XYLOCAINE) 1 % injection 2 mL  . betamethasone acetate-betamethasone sodium phosphate (CELESTONE) injection 12 mg     Laterality: Right  Location/Site:  L5-S1  Needle size: 20 G  Needle type: Tuohy  Needle Placement: Paramedian epidural  Findings:  -Contrast Used: 1 mL iohexol 180 mg iodine/mL   -Comments: Excellent flow of contrast into the epidural space.  Procedure Details: Using a paramedian approach from the side mentioned above, the region overlying the inferior lamina was localized under fluoroscopic visualization and the soft  tissues overlying this structure were infiltrated with 4 ml. of 1% Lidocaine without Epinephrine. The Tuohy needle was inserted into the epidural space using a paramedian approach.   The epidural space was localized using loss of resistance along with lateral and bi-planar fluoroscopic views.  After negative aspirate for air, blood, and CSF, a 2 ml. volume of Isovue-250 was injected into the epidural space and the flow of contrast was observed. Radiographs were obtained for documentation purposes.    The injectate was administered into the level noted above.   Additional Comments:  The patient tolerated the procedure well Dressing: Band-Aid    Post-procedure details: Patient was observed during the procedure. Post-procedure instructions were reviewed.  Patient left the clinic in stable condition.

## 2017-04-12 ENCOUNTER — Encounter: Payer: Self-pay | Admitting: Vascular Surgery

## 2017-04-12 ENCOUNTER — Ambulatory Visit (INDEPENDENT_AMBULATORY_CARE_PROVIDER_SITE_OTHER): Payer: 59 | Admitting: Vascular Surgery

## 2017-04-12 VITALS — BP 135/89 | HR 91 | Temp 97.6°F | Resp 18 | Ht 66.0 in | Wt 255.0 lb

## 2017-04-12 DIAGNOSIS — I83893 Varicose veins of bilateral lower extremities with other complications: Secondary | ICD-10-CM | POA: Diagnosis not present

## 2017-04-12 NOTE — Progress Notes (Signed)
     Laser Ablation Procedure    Date: 04/12/2017   Mary Morrison DOB:[birth year]  Consent signed: Yes    Surgeon:  Dr. Sherren Mocha   Procedure: Laser Ablation: right Greater Saphenous Vein  BP 135/89   Pulse 91   Temp 97.6 F (36.4 C)   Resp 18   Ht 5\' 6"  (1.676 m)   Wt 255 lb (115.7 kg)   LMP 02/10/2014   SpO2 97%   BMI 41.16 kg/m   Tumescent Anesthesia: 500 cc 0.9% NaCl with 50 cc Lidocaine HCL with 1% Epi and 15 cc 8.4% NaHCO3  Local Anesthesia: 6 cc Lidocaine HCL and NaHCO3 (ratio 2:1)  15 watts continuous mode        Total energy: 2648   Total time: 2:55    Patient tolerated procedure well  Notes:   Description of Procedure:  After marking the course of the secondary varicosities, the patient was placed on the operating table in the supine position, and the right leg was prepped and draped in sterile fashion.   Local anesthetic was administered and under ultrasound guidance the saphenous vein was accessed with a micro needle and guide wire; then the mirco puncture sheath was placed.  A guide wire was inserted saphenofemoral junction , followed by a 5 french sheath.  The position of the sheath and then the laser fiber below the junction was confirmed using the ultrasound.  Tumescent anesthesia was administered along the course of the saphenous vein using ultrasound guidance. The patient was placed in Trendelenburg position and protective laser glasses were placed on patient and staff, and the laser was fired at 15 watts continuous mode advancing 1-44mm/second for a total of 2648 joules.    Steri strips were applied to the stab wounds and ABD pads and thigh high compression stockings were applied.  Ace wrap bandages were applied over the phlebectomy sites and at the top of the saphenofemoral junction. Blood loss was less than 15 cc.  The patient ambulated out of the operating room having tolerated the procedure well.

## 2017-04-18 DIAGNOSIS — M25562 Pain in left knee: Secondary | ICD-10-CM | POA: Diagnosis not present

## 2017-04-18 DIAGNOSIS — M545 Low back pain: Secondary | ICD-10-CM | POA: Diagnosis not present

## 2017-04-18 DIAGNOSIS — M79605 Pain in left leg: Secondary | ICD-10-CM | POA: Diagnosis not present

## 2017-04-19 ENCOUNTER — Ambulatory Visit: Payer: 59 | Admitting: Vascular Surgery

## 2017-04-19 ENCOUNTER — Inpatient Hospital Stay (HOSPITAL_COMMUNITY): Admission: RE | Admit: 2017-04-19 | Payer: 59 | Source: Ambulatory Visit

## 2017-04-23 DIAGNOSIS — M25562 Pain in left knee: Secondary | ICD-10-CM | POA: Diagnosis not present

## 2017-04-23 DIAGNOSIS — M79605 Pain in left leg: Secondary | ICD-10-CM | POA: Diagnosis not present

## 2017-04-23 DIAGNOSIS — M545 Low back pain: Secondary | ICD-10-CM | POA: Diagnosis not present

## 2017-04-24 ENCOUNTER — Encounter: Payer: Self-pay | Admitting: Vascular Surgery

## 2017-04-26 ENCOUNTER — Ambulatory Visit (HOSPITAL_COMMUNITY)
Admission: RE | Admit: 2017-04-26 | Discharge: 2017-04-26 | Disposition: A | Discharge status: Home / Self Care | Payer: 59 | Source: Ambulatory Visit | Attending: Vascular Surgery | Admitting: Vascular Surgery

## 2017-04-26 ENCOUNTER — Ambulatory Visit (INDEPENDENT_AMBULATORY_CARE_PROVIDER_SITE_OTHER): Payer: 59 | Admitting: Vascular Surgery

## 2017-04-26 VITALS — BP 148/91 | HR 83 | Temp 97.6°F | Resp 16 | Ht 66.5 in | Wt 250.0 lb

## 2017-04-26 DIAGNOSIS — I83893 Varicose veins of bilateral lower extremities with other complications: Secondary | ICD-10-CM | POA: Diagnosis not present

## 2017-04-26 DIAGNOSIS — I82811 Embolism and thrombosis of superficial veins of right lower extremities: Secondary | ICD-10-CM | POA: Diagnosis not present

## 2017-04-26 DIAGNOSIS — I868 Varicose veins of other specified sites: Secondary | ICD-10-CM

## 2017-04-26 NOTE — Progress Notes (Signed)
Vascular and Vein Specialist of [facility]  Patient name: Mary Morrison MRN: [id number] DOB: [birth year] Sex: female  REASON FOR VISIT: Here today for 2 week follow-up after ablation of her right great saphenous vein. She had similar treatment of her left great saphenous vein on 03/20/2017  HPI: Mary Morrison is a 51 y.o. female here for follow-up. Well. Does report slightly more discomfort in her right groin than on her prior procedure on the left. She reports that it to her recollection the left leg had the soreness for 2-3 days and this is persistent slightly longer to about a week on the right. This has resolved. She is reporting improvement as far as swelling bilaterally. She has been compliant with her compression garments.  Past Medical History:  Diagnosis Date  . Fibroids   . Obesity   . Seasonal allergies     Family History  Problem Relation Age of Onset  . Cancer Mother        breast/bilateral mastectomies  . Diabetes Other   . Hypertension Other   . Stroke Other   . Obesity Other     SOCIAL HISTORY: Social History  Substance Use Topics  . Smoking status: Never Smoker  . Smokeless tobacco: Never Used  . Alcohol use No    No Known Allergies  Current Outpatient Prescriptions  Medication Sig Dispense Refill  . buPROPion (WELLBUTRIN XL) 300 MG 24 hr tablet     . BuPROPion HCl (WELLBUTRIN PO) Take by mouth.    Marland Kitchen CALCIUM PO Take 1 tablet by mouth daily. Reported on 08/04/2015    . Glucosamine-Chondroitin (COSAMIN DS PO) Take by mouth.    Marland Kitchen ibuprofen (CHILDRENS MOTRIN) 100 MG/5ML suspension Take 40 mLs (800 mg total) by mouth every 6 (six) hours. 473 mL 2  . Melatonin 10 MG TABS Take 10 mg by mouth.    . Multiple Vitamins-Minerals (MULTIVITAMINS) CHEW Chew 1 tablet by mouth daily. Reported on 08/04/2015    . phentermine 37.5 MG capsule Take 37.5 mg by mouth every morning.    . Probiotic Product (PROBIOTIC PO) Take 1 capsule by  mouth daily.    . traZODone (DESYREL) 50 MG tablet Take 100 mg by mouth at bedtime.     . TRIAMTERENE PO Take by mouth.    . triamterene-hydrochlorothiazide (MAXZIDE-25) 37.5-25 MG tablet TAKE 1 TABLET BY MOUTH ONCE A DAY IN THE MORNING  0   No current facility-administered medications for this visit.     REVIEW OF SYSTEMS:  [X]  denotes positive finding, [ ]  denotes negative finding Cardiac  Comments:  Chest pain or chest pressure:    Shortness of breath upon exertion:    Short of breath when lying flat:    Irregular heart rhythm:        Vascular    Pain in calf, thigh, or hip brought on by ambulation:    Pain in feet at night that wakes you up from your sleep:     Blood clot in your veins:    Leg swelling:  x         PHYSICAL EXAM: Vitals:   04/26/17 1111  BP: (!) 148/91  Pulse: 83  Resp: 16  Temp: 97.6 F (36.4 C)  SpO2: 99%  Weight: 250 lb (113.4 kg)  Height: 5' 6.5" (1.689 m)    GENERAL: The patient is a well-nourished female, in no acute distress. The vital signs are documented above. CARDIOVASCULAR: Palpable dorsalis pedis pulses. Duplex PULMONARY: There is good  air exchange  MUSCULOSKELETAL: There are no major deformities or cyanosis. NEUROLOGIC: No focal weakness or paresthesias are detected. SKIN: There are no ulcers or rashes noted. PSYCHIATRIC: The patient has a normal affect.  DATA:  Duplex today shows closure of her great saphenous vein on the right from the mid calf to 3 cm from the saphenofemoral junction. No evidence of DVT  MEDICAL ISSUES: Stable following staged bilateral great saphenous vein ablation for venous hypertension. We'll continue wearing her compression garments on as-needed basis. Check she feels improvement with thigh compression and will continue to wear these. She is to see [place] in the office for sclerotherapy of several telangiectasia on her lower extremity since well    Rosetta Posner, MD [facility] Vascular and Vein Specialists of  [facility] Tel [phone] Pager [phone]

## 2017-04-27 DIAGNOSIS — M545 Low back pain: Secondary | ICD-10-CM | POA: Diagnosis not present

## 2017-04-27 DIAGNOSIS — M25562 Pain in left knee: Secondary | ICD-10-CM | POA: Diagnosis not present

## 2017-04-27 DIAGNOSIS — M79605 Pain in left leg: Secondary | ICD-10-CM | POA: Diagnosis not present

## 2017-04-29 DIAGNOSIS — Z23 Encounter for immunization: Secondary | ICD-10-CM | POA: Diagnosis not present

## 2017-04-30 DIAGNOSIS — M79605 Pain in left leg: Secondary | ICD-10-CM | POA: Diagnosis not present

## 2017-04-30 DIAGNOSIS — M25562 Pain in left knee: Secondary | ICD-10-CM | POA: Diagnosis not present

## 2017-04-30 DIAGNOSIS — M545 Low back pain: Secondary | ICD-10-CM | POA: Diagnosis not present

## 2017-05-04 DIAGNOSIS — M25562 Pain in left knee: Secondary | ICD-10-CM | POA: Diagnosis not present

## 2017-05-04 DIAGNOSIS — M545 Low back pain: Secondary | ICD-10-CM | POA: Diagnosis not present

## 2017-05-04 DIAGNOSIS — M79605 Pain in left leg: Secondary | ICD-10-CM | POA: Diagnosis not present

## 2017-05-09 DIAGNOSIS — M79605 Pain in left leg: Secondary | ICD-10-CM | POA: Diagnosis not present

## 2017-05-09 DIAGNOSIS — M25562 Pain in left knee: Secondary | ICD-10-CM | POA: Diagnosis not present

## 2017-05-09 DIAGNOSIS — M545 Low back pain: Secondary | ICD-10-CM | POA: Diagnosis not present

## 2017-05-10 ENCOUNTER — Ambulatory Visit (INDEPENDENT_AMBULATORY_CARE_PROVIDER_SITE_OTHER): Payer: 59 | Admitting: Orthopedic Surgery

## 2017-05-10 DIAGNOSIS — M1712 Unilateral primary osteoarthritis, left knee: Secondary | ICD-10-CM | POA: Diagnosis not present

## 2017-05-10 DIAGNOSIS — M25562 Pain in left knee: Secondary | ICD-10-CM | POA: Diagnosis not present

## 2017-05-12 ENCOUNTER — Encounter (INDEPENDENT_AMBULATORY_CARE_PROVIDER_SITE_OTHER): Payer: Self-pay | Admitting: Orthopedic Surgery

## 2017-05-12 DIAGNOSIS — M25562 Pain in left knee: Secondary | ICD-10-CM | POA: Diagnosis not present

## 2017-05-12 DIAGNOSIS — M1712 Unilateral primary osteoarthritis, left knee: Secondary | ICD-10-CM | POA: Diagnosis not present

## 2017-05-12 MED ORDER — METHYLPREDNISOLONE ACETATE 40 MG/ML IJ SUSP
40.0000 mg | INTRAMUSCULAR | Status: AC | PRN
  Administered 2017-05-12: 40 mg via INTRA_ARTICULAR

## 2017-05-12 MED ORDER — BUPIVACAINE HCL 0.25 % IJ SOLN
4.0000 mL | INTRAMUSCULAR | Status: AC | PRN
  Administered 2017-05-12: 4 mL via INTRA_ARTICULAR

## 2017-05-12 MED ORDER — LIDOCAINE HCL 1 % IJ SOLN
5.0000 mL | INTRAMUSCULAR | Status: AC | PRN
  Administered 2017-05-12: 5 mL

## 2017-05-12 NOTE — Progress Notes (Signed)
Office Visit Note   Patient: Mary Morrison           Date of Birth: 1965-11-08           MRN: [id number] Visit Date: 05/10/2017 Requested by: Harlan Stains, MD [address], [place] [address] PCP: Harlan Stains, MD  Subjective: Chief Complaint  Patient presents with  . Left Knee - Pain    HPI: patient is here for evaluation of left knee arthritis. [place] with [place] this [place] for her birthday.  She would like to be able to hike with less pain.  She would also like to receive knee replacement in December.  Last injection was July of this year.  She wants to be able to walk without pain.  She has in stage arthritis in the left knee.  She has failed conservative measures.  She does have a history of issues with opioid narcotics and addiction in the past.  She wants to avoid narcotic medication postoperatively if possible.              ROS: All systems reviewed are negative as they relate to the chief complaint within the history of present illness.  Patient denies  fevers or chills.   Assessment & Plan: Visit Diagnoses:  1. Unilateral primary osteoarthritis, left knee     Plan: impression is left knee arthritis in stage with failure of conservative measures.  Plan is injection today with knee replacement at least 2 months from now.  We will schedule that formid December prior to Christmas.  She may also be a candidate for indwelling femoral catheter placement while she is in the hospital because she wants to avoid narcotic medication if possible.  That will be difficult and that is explained to the patient.  She may need some form of medication in order to manage the pain postoperatively.  All questions answered.  Follow-Up Instructions: No Follow-up on file.   Orders:  No orders of the defined types were placed in this encounter.  No orders of the defined types were placed in this encounter.     Procedures: Large Joint  Inj Date/Time: 05/12/2017 9:59 PM Performed by: Meredith Pel Authorized by: Meredith Pel   Consent Given by:  Patient Site marked: the procedure site was marked   Timeout: prior to procedure the correct patient, procedure, and site was verified   Indications:  Pain, joint swelling and diagnostic evaluation Location:  Knee Site:  L knee Prep: patient was prepped and draped in usual sterile fashion   Needle Size:  18 G Needle Length:  1.5 inches Approach:  Superolateral Ultrasound Guidance: No   Fluoroscopic Guidance: No   Arthrogram: No   Medications:  5 mL lidocaine 1 %; 4 mL bupivacaine 0.25 %; 40 mg methylPREDNISolone acetate 40 MG/ML Patient tolerance:  Patient tolerated the procedure well with no immediate complications     Clinical Data: No additional findings.  Objective: Vital Signs: LMP 02/10/2014   Physical Exam:   Constitutional: Patient appears well-developed HEENT:  Head: Normocephalic Eyes:EOM are normal Neck: Normal range of motion Cardiovascular: Normal rate Pulmonary/chest: Effort normal Neurologic: Patient is alert Skin: Skin is warm Psychiatric: Patient has normal mood and affect    Ortho Exam: orthopedic exam demonstrates full active and passive range of motion of the right knee.  The left knee hasan 8 flexion contracture with flexion easily past 90.  Collateral and cruciate ligaments are stable.  Pedal pulses palpable.  Varus alignment noted.  No groin pain with internal and external rotation of the leg  Specialty Comments:  No specialty comments available.  Imaging: No results found.   PMFS History: Patient Active Problem List   Diagnosis Date Noted  . Varicose veins of bilateral lower extremities with other complications 02/54/2706  . Acute pain of left knee 01/25/2017  . Swelling of left knee joint 01/25/2017  . S/P hysterectomy 09/21/2014  . Lapband APS July 2015 02/17/2014  . Edema-chronic pitting in legs 01/20/2014   . Morbid obesity ([place]) 08/28/2013   Past Medical History:  Diagnosis Date  . Fibroids   . Obesity   . Seasonal allergies     Family History  Problem Relation Age of Onset  . Cancer Mother        breast/bilateral mastectomies  . Diabetes Other   . Hypertension Other   . Stroke Other   . Obesity Other     Past Surgical History:  Procedure Laterality Date  . BILATERAL SALPINGECTOMY Bilateral 09/21/2014   Procedure: BILATERAL SALPINGECTOMY;  Surgeon: Marvene Staff, MD;  Location: [facility];  Service: Gynecology;  Laterality: Bilateral;  . CESAREAN SECTION  1989  . COLONOSCOPY    . LAPAROSCOPIC GASTRIC BANDING N/A 02/17/2014   Procedure: LAPAROSCOPIC GASTRIC BANDING;  Surgeon: Pedro Earls, MD;  Location: [facility];  Service: General;  Laterality: N/A;  . ROBOT ASSISTED MYOMECTOMY N/A 09/21/2014   Procedure: ROBOTIC ASSISTED MYOMECTOMY;  Surgeon: Marvene Staff, MD;  Location: [facility];  Service: Gynecology;  Laterality: N/A;  . ROBOTIC ASSISTED TOTAL HYSTERECTOMY N/A 09/21/2014   Procedure: ROBOTIC ASSISTED TOTAL HYSTERECTOMY, EXCISION OF PELVIC ENDOMETRIOSIS;  Surgeon: Marvene Staff, MD;  Location: [facility];  Service: Gynecology;  Laterality: N/A;  . TONSILLECTOMY     Social History   Occupational History  . Not on file.   Social History Main Topics  . Smoking status: Never Smoker  . Smokeless tobacco: Never Used  . Alcohol use No  . Drug use: No  . Sexual activity: Not Currently    Birth control/ protection: None

## 2017-05-14 DIAGNOSIS — M545 Low back pain: Secondary | ICD-10-CM | POA: Diagnosis not present

## 2017-05-14 DIAGNOSIS — M79605 Pain in left leg: Secondary | ICD-10-CM | POA: Diagnosis not present

## 2017-05-14 DIAGNOSIS — M25562 Pain in left knee: Secondary | ICD-10-CM | POA: Diagnosis not present

## 2017-05-16 ENCOUNTER — Ambulatory Visit (INDEPENDENT_AMBULATORY_CARE_PROVIDER_SITE_OTHER): Payer: 59 | Admitting: *Deleted

## 2017-05-16 DIAGNOSIS — I83893 Varicose veins of bilateral lower extremities with other complications: Secondary | ICD-10-CM | POA: Diagnosis not present

## 2017-05-16 DIAGNOSIS — I868 Varicose veins of other specified sites: Secondary | ICD-10-CM

## 2017-05-16 NOTE — Progress Notes (Signed)
X=.3% Sotradecol administered with a 27g butterfly.  Patient received a total of 18cc.  Treated all areas of concern easily. Tol pretty well. Anticipate good results. Follow prn.   Compression stockings applied: Yes.

## 2017-05-17 ENCOUNTER — Encounter: Payer: Self-pay | Admitting: *Deleted

## 2017-05-30 DIAGNOSIS — Z1231 Encounter for screening mammogram for malignant neoplasm of breast: Secondary | ICD-10-CM | POA: Diagnosis not present

## 2017-06-07 ENCOUNTER — Other Ambulatory Visit (INDEPENDENT_AMBULATORY_CARE_PROVIDER_SITE_OTHER): Payer: Self-pay

## 2017-06-08 ENCOUNTER — Telehealth: Payer: Self-pay | Admitting: *Deleted

## 2017-06-08 NOTE — Telephone Encounter (Signed)
Mary Morrison left telephone message at 1:35 PM today that the sclero injection sites "are red and hurt and I am concerned about infection." Stated she would go to Urgent Care to be evaluated.  I left voice message that she was scheduled for an appointment with Dr. Donnetta Hutching on 06-19-2017 at 12:15PM which is his first day back at [facility]. (next available appointment). Will follow prn.

## 2017-06-08 NOTE — Telephone Encounter (Signed)
Returning Mrs. Weatherbee's earlier telephone message. Made three attempts to call her and left two telephone messages.  Mrs. Cosper is s/p sclerotherapy 05-16-2017 by Thea Silversmith RN and hx of staged bilateral laser ablations by Dr. Donnetta Hutching 04-12-2017 [facility] and 03-15-2017 [facility].  Mrs. Watters states she has "two spots that were injected that are not healing and one that I scratched that bled." She also states "my feet and legs are both swelling like before the laser ablation surgeries."  Mrs. Lugar states she is scheduled for knee surgery on 07-03-2017 was wants to be evaluated by Dr. Donnetta Hutching prior to that surgery.  Left her voice message that Dr. Donnetta Hutching was out of the office until 06-19-2017. Made [facility] appointment for her on 06-19-2017 at 12:15PM and left that on her voice mail.  Encouraged her to use antibiotic ointment to sclero injection sites that were not healing.  Will follow prn.

## 2017-06-12 ENCOUNTER — Ambulatory Visit: Payer: 59 | Admitting: Skilled Nursing Facility1

## 2017-06-19 ENCOUNTER — Other Ambulatory Visit: Payer: Self-pay | Admitting: *Deleted

## 2017-06-19 ENCOUNTER — Ambulatory Visit (INDEPENDENT_AMBULATORY_CARE_PROVIDER_SITE_OTHER): Payer: 59 | Admitting: Vascular Surgery

## 2017-06-19 ENCOUNTER — Encounter: Payer: Self-pay | Admitting: Vascular Surgery

## 2017-06-19 VITALS — BP 129/91 | HR 79 | Temp 98.2°F | Resp 16 | Ht 66.5 in | Wt 256.0 lb

## 2017-06-19 DIAGNOSIS — Z13220 Encounter for screening for lipoid disorders: Secondary | ICD-10-CM | POA: Diagnosis not present

## 2017-06-19 DIAGNOSIS — I83893 Varicose veins of bilateral lower extremities with other complications: Secondary | ICD-10-CM

## 2017-06-19 MED ORDER — CEPHALEXIN 500 MG PO CAPS: 500.0000 mg | ORAL_CAPSULE | Freq: Four times a day (QID) | ORAL | 0 refills | Status: DC

## 2017-06-19 NOTE — Progress Notes (Signed)
Vascular and Vein Specialist of [facility]  Patient name: Mary Morrison MRN: [id number] DOB: 1965/08/01 Sex: female  REASON FOR VISIT: Lower extremity venous hypertension  HPI: Mary Morrison is a 51 y.o. female here today for follow-up.  She is status post staged bilateral laser left on March 15, 2017 and right on 04/12/2017.  She subsequently underwent a sclerotherapy for painful telangiectasia.  She is here today concerning swelling in her lower extremities.  Also has a area of sclerotherapy on the left pretibial with some surrounding erythema and she is concerned with this as well.  She has been treating this with topical antibiotic.  Mild tenderness as well.  No fevers.  Past Medical History:  Diagnosis Date  . Fibroids   . Obesity   . Seasonal allergies     Family History  Problem Relation Age of Onset  . Cancer Mother        breast/bilateral mastectomies  . Diabetes Other   . Hypertension Other   . Stroke Other   . Obesity Other     SOCIAL HISTORY: Social History   Tobacco Use  . Smoking status: Never Smoker  . Smokeless tobacco: Never Used  Substance Use Topics  . Alcohol use: No    No Known Allergies  Current Outpatient Medications  Medication Sig Dispense Refill  . buPROPion (WELLBUTRIN XL) 300 MG 24 hr tablet     . BuPROPion HCl (WELLBUTRIN PO) Take by mouth.    Marland Kitchen CALCIUM PO Take 1 tablet by mouth daily. Reported on 08/04/2015    . Glucosamine-Chondroitin (COSAMIN DS PO) Take by mouth.    Marland Kitchen ibuprofen (CHILDRENS MOTRIN) 100 MG/5ML suspension Take 40 mLs (800 mg total) by mouth every 6 (six) hours. 473 mL 2  . Melatonin 10 MG TABS Take 10 mg by mouth.    . Multiple Vitamins-Minerals (MULTIVITAMINS) CHEW Chew 1 tablet by mouth daily. Reported on 08/04/2015    . phentermine 37.5 MG capsule Take 37.5 mg by mouth every morning.    . Probiotic Product (PROBIOTIC PO) Take 1 capsule by mouth daily.    . traZODone (DESYREL)  50 MG tablet Take 100 mg by mouth at bedtime.     . TRIAMTERENE PO Take by mouth.    . triamterene-hydrochlorothiazide (MAXZIDE-25) 37.5-25 MG tablet TAKE 1 TABLET BY MOUTH ONCE A DAY IN THE MORNING  0   No current facility-administered medications for this visit.     REVIEW OF SYSTEMS:  [X]  denotes positive finding, [ ]  denotes negative finding Cardiac  Comments:  Chest pain or chest pressure:    Shortness of breath upon exertion:    Short of breath when lying flat:    Irregular heart rhythm:        Vascular    Pain in calf, thigh, or hip brought on by ambulation:    Pain in feet at night that wakes you up from your sleep:     Blood clot in your veins:    Leg swelling:  x         PHYSICAL EXAM: Vitals:   06/19/17 1214  BP: (!) 129/91  Pulse: 79  Resp: 16  Temp: 98.2 F (36.8 C)  SpO2: 97%  Weight: 256 lb (116.1 kg)  Height: 5' 6.5" (1.689 m)    GENERAL: The patient is a well-nourished female, in no acute distress. The vital signs are documented above. CARDIOVASCULAR: Palpable dorsalis pedis pulses bilaterally PULMONARY: There is good air exchange  MUSCULOSKELETAL: There are no  major deformities or cyanosis. NEUROLOGIC: No focal weakness or paresthesias are detected. SKIN: He does have areas of sclerotherapy treatment on both lower extremities with a typical dark discoloration of some blood in the telangiectasia itself.  There is one area of very superficial ulceration with mild surrounding erythema over the left pretibial area PSYCHIATRIC: The patient has a normal affect.  DATA:  She did not have a formal venous duplex.  I did image her saphenous vein with SonoSite and these are closed bilaterally.  MEDICAL ISSUES: Reassured her regarding the area of concern.  She does have a upcoming total knee replacement on December 11.  I doubt that she does have any invasive infection in this area of superficial ulceration but due to her upcoming placement of prosthetic device I  have written a prescription for Keflex 500 mg 3 times daily x15 for a total of 5 days.  She is also requesting a prescription of Diflucan at the same setting since she develops a vaginal yeast infection with oral antibiotics.  She will notify us if she has difficulty in the future    Rosetta Posner, MD [facility] Vascular and Vein Specialists of [facility] Tel [phone] Pager [phone]

## 2017-06-22 DIAGNOSIS — Z0000 Encounter for general adult medical examination without abnormal findings: Secondary | ICD-10-CM | POA: Diagnosis not present

## 2017-06-22 DIAGNOSIS — G4700 Insomnia, unspecified: Secondary | ICD-10-CM | POA: Diagnosis not present

## 2017-06-22 DIAGNOSIS — I10 Essential (primary) hypertension: Secondary | ICD-10-CM | POA: Diagnosis not present

## 2017-06-27 ENCOUNTER — Encounter (HOSPITAL_COMMUNITY)
Admission: RE | Admit: 2017-06-27 | Discharge: 2017-06-27 | Disposition: A | Discharge status: Home / Self Care | Payer: 59 | Source: Ambulatory Visit | Attending: Orthopedic Surgery | Admitting: Orthopedic Surgery

## 2017-06-27 ENCOUNTER — Other Ambulatory Visit: Payer: Self-pay

## 2017-06-27 ENCOUNTER — Encounter (HOSPITAL_COMMUNITY): Payer: Self-pay

## 2017-06-27 ENCOUNTER — Telehealth (INDEPENDENT_AMBULATORY_CARE_PROVIDER_SITE_OTHER): Payer: Self-pay | Admitting: Orthopedic Surgery

## 2017-06-27 ENCOUNTER — Encounter: Payer: 59 | Attending: Surgery | Admitting: Skilled Nursing Facility1

## 2017-06-27 DIAGNOSIS — J45909 Unspecified asthma, uncomplicated: Secondary | ICD-10-CM | POA: Diagnosis not present

## 2017-06-27 DIAGNOSIS — Z9884 Bariatric surgery status: Secondary | ICD-10-CM | POA: Diagnosis not present

## 2017-06-27 DIAGNOSIS — Z01812 Encounter for preprocedural laboratory examination: Secondary | ICD-10-CM | POA: Diagnosis not present

## 2017-06-27 DIAGNOSIS — Z6841 Body Mass Index (BMI) 40.0 and over, adult: Secondary | ICD-10-CM | POA: Insufficient documentation

## 2017-06-27 DIAGNOSIS — I739 Peripheral vascular disease, unspecified: Secondary | ICD-10-CM | POA: Insufficient documentation

## 2017-06-27 DIAGNOSIS — Z01810 Encounter for preprocedural cardiovascular examination: Secondary | ICD-10-CM | POA: Insufficient documentation

## 2017-06-27 DIAGNOSIS — F329 Major depressive disorder, single episode, unspecified: Secondary | ICD-10-CM | POA: Diagnosis not present

## 2017-06-27 DIAGNOSIS — M1990 Unspecified osteoarthritis, unspecified site: Secondary | ICD-10-CM | POA: Insufficient documentation

## 2017-06-27 DIAGNOSIS — Z713 Dietary counseling and surveillance: Secondary | ICD-10-CM | POA: Diagnosis present

## 2017-06-27 HISTORY — DX: Depression, unspecified: F32.A

## 2017-06-27 HISTORY — DX: Asymptomatic varicose veins of unspecified lower extremity: I83.90

## 2017-06-27 HISTORY — DX: Unspecified asthma, uncomplicated: J45.909

## 2017-06-27 HISTORY — DX: Unspecified osteoarthritis, unspecified site: M19.90

## 2017-06-27 HISTORY — DX: Major depressive disorder, single episode, unspecified: F32.9

## 2017-06-27 LAB — CBC
HCT: 42.7 % (ref 36.0–46.0)
Hemoglobin: 13.8 g/dL (ref 12.0–15.0)
MCH: 33.5 pg (ref 26.0–34.0)
MCHC: 32.3 g/dL (ref 30.0–36.0)
MCV: 103.6 fL — ABNORMAL HIGH (ref 78.0–100.0)
Platelets: 251 10*3/uL (ref 150–400)
RBC: 4.12 MIL/uL (ref 3.87–5.11)
RDW: 13.3 % (ref 11.5–15.5)
WBC: 6.9 10*3/uL (ref 4.0–10.5)

## 2017-06-27 LAB — BASIC METABOLIC PANEL
Anion gap: 10 (ref 5–15)
BUN: 11 mg/dL (ref 6–20)
CO2: 26 mmol/L (ref 22–32)
Calcium: 9 mg/dL (ref 8.9–10.3)
Chloride: 100 mmol/L — ABNORMAL LOW (ref 101–111)
Creatinine, Ser: 0.93 mg/dL (ref 0.44–1.00)
GFR calc Af Amer: >60 mL/min (ref >60)
GFR calc non Af Amer: >60 mL/min (ref >60)
Glucose, Bld: 106 mg/dL — ABNORMAL HIGH (ref 65–99)
Potassium: 3.5 mmol/L (ref 3.5–5.1)
Sodium: 136 mmol/L (ref 135–145)

## 2017-06-27 LAB — SURGICAL PCR SCREEN
MRSA, PCR: NEGATIVE
Staphylococcus aureus: NEGATIVE

## 2017-06-27 NOTE — Progress Notes (Signed)
No orders in epic, called Dr. Randel Pigg office for orders.

## 2017-06-27 NOTE — Progress Notes (Signed)
Need pic of leg

## 2017-06-27 NOTE — Pre-Procedure Instructions (Signed)
Mary Morrison  06/27/2017      [id number] [place], [place] [address] [place] [address] Phone: [phone] Fax: [phone]    Your procedure is scheduled on Tues. Dec. 11  Report to [facility] Admitting at 9:00 A.M.  Call this number if you have problems the morning of surgery:  [phone]   Remember:  Do not eat food or drink liquids after midnight on Mon. Dec. 10   Take these medicines the morning of surgery with A SIP OF WATER : bupropion (wellbutrin)              7 days prior to surgery STOP taking any Aspirin(unless otherwise instructed by your surgeon), Aleve, Naproxen, Ibuprofen, Motrin, Advil, Goody's, BC's, all herbal medications, fish oil, and all vitamins   Do not wear jewelry, make-up or nail polish.  Do not wear lotions, powders, or perfumes, or deoderant.  Do not shave 48 hours prior to surgery.  Men may shave face and neck.  Do not bring valuables to the hospital.  [facility] is not responsible for any belongings or valuables.  Contacts, dentures or bridgework may not be worn into surgery.  Leave your suitcase in the car.  After surgery it may be brought to your room.  For patients admitted to the hospital, discharge time will be determined by your treatment team.  Patients discharged the day of surgery will not be allowed to drive home.    Special instructions:   Mary Morrison- Preparing For Surgery  Before surgery, you can play an important role. Because skin is not sterile, your skin needs to be as free of germs as possible. You can reduce the number of germs on your skin by washing with CHG (chlorahexidine gluconate) Soap before surgery.  CHG is an antiseptic cleaner which kills germs and bonds with the skin to continue killing germs even after washing.  Please do not use if you have an allergy to CHG or antibacterial soaps. If your skin becomes reddened/irritated stop using the CHG.  Do  not shave (including legs and underarms) for at least 48 hours prior to first CHG shower. It is OK to shave your face.  Please follow these instructions carefully.   1. Shower the NIGHT BEFORE SURGERY and the MORNING OF SURGERY with CHG.   2. If you chose to wash your hair, wash your hair first as usual with your normal shampoo.  3. After you shampoo, rinse your hair and body thoroughly to remove the shampoo.  4. Use CHG as you would any other liquid soap. You can apply CHG directly to the skin and wash gently with a scrungie or a clean washcloth.   5. Apply the CHG Soap to your body ONLY FROM THE NECK DOWN.  Do not use on open wounds or open sores. Avoid contact with your eyes, ears, mouth and genitals (private parts). Wash Face and genitals (private parts)  with your normal soap.  6. Wash thoroughly, paying special attention to the area where your surgery will be performed.  7. Thoroughly rinse your body with warm water from the neck down.  8. DO NOT shower/wash with your normal soap after using and rinsing off the CHG Soap.  9. Pat yourself dry with a CLEAN TOWEL.  10. Wear CLEAN PAJAMAS to bed the night before surgery, wear comfortable clothes the morning of surgery  11. Place CLEAN SHEETS on your bed the night of  your first shower and DO NOT SLEEP WITH PETS.    Day of Surgery: Do not apply any deodorants/lotions. Please wear clean clothes to the hospital/surgery center.      Please read over the following fact sheets that you were given. Coughing and Deep Breathing, MRSA Information and Surgical Site Infection Prevention

## 2017-06-27 NOTE — Progress Notes (Signed)
.  Pre-Op Assessment Visit:  Pre-Operative Sleeve Gastrectomy Surgery  Medical Nutrition Therapy:  Appt start time:  10:34 End time:  11:38  Patient was seen on 06/27/2017 for Pre-Operative Nutrition Assessment. Assessment and letter of approval faxed to [facility] Surgery Bariatric Surgery Program coordinator on 06/27/2017.    Pt states she does not know if she will have the surgery. Pt states she is disppointed int he [employer] because it was just another low carb diet. Pt admits to thinking the surgery was just the easy way of weight loss and would not have to make any behavior changes. 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 Pt states she is a Night time eater. Pt states she used to drink alcohol about 20 years ago. Pt was emotional and admitted to dealing with stress with food. Pt states she is hungry all the time and never feels full and strives to feel full because it is so comforting.   Pt states she prefers to work   Pt expectation of surgery: to help me not feel hunger and lose weight  Pt expectation of Dietitian: to guide me  Start weight at [facility]: 257 BMI: 41.48  24 hr Dietary Recall: First Meal: yogurt and walnuts Snack:  Second Meal: atkins bar Snack:  Third Meal: bratwurst and spinach and onion and red pepper and potatoes Snack: nuts and apple Beverages: flavored water, soda, carbonated water  Encouraged to engage in 150 minutes of moderate physical activity including cardiovascular and weight baring weekly  Handouts given during visit include:  . Pre-Op Goals . Bariatric Surgery Protein Shakes During the appointment today the following Pre-Op Goals were reviewed with the patient: . Maintain or lose weight as instructed by your surgeon . Make healthy food choices . Begin to limit portion sizes . Limited concentrated sugars and fried foods . Keep fat/sugar in the single digits per serving  on             food labels . Practice CHEWING your food  (aim for 30 chews per bite or until applesauce consistency) . Practice not drinking 15 minutes before, during, and 30 minutes after each meal/snack . Avoid all carbonated beverages  . Avoid/limit caffeinated beverages  . Avoid all sugar-sweetened beverages . Consume 3 meals per day; eat every 3-5 hours . Make a list of non-food related activities . Aim for 64-100 ounces of FLUID daily  . Aim for at least 60-80 grams of PROTEIN daily . Look for a liquid protein source that contain ?15 g protein and ?5 g carbohydrate  (ex: shakes, drinks, shots)  -Follow diet recommendations listed below   Energy and Macronutrient Recomendations: Calories: 1500 Carbohydrate: 170 Protein: 112 Fat: 42  Demonstrated degree of understanding via:  Teach Back  Teaching Method Utilized:  Visual Auditory Hands on  Barriers to learning/adherence to lifestyle change: emotional eating   Patient to call the Nutrition and [facility] Education Services to enroll in Pre-Op and Post-Op Nutrition Education when surgery date is scheduled.

## 2017-06-27 NOTE — Progress Notes (Signed)
PATIENT JUST FINISHING ANTIBIOTIC FOR RED AREA TO LEFT CALF WHERE SHE HAD SCLEROTHERAPY W/ DR. EARLY.  PATIENT STATED THE DOCTOR SAID IT WAS NOT INFECTED BUT SHE ASKED FOR ANTIBIOTIC DUE TO UPCOMING KNEE SURGERY.  INSTRUCTED PATIENT TO CALL DR. Randel Pigg OFFICE IF SHE DEVELOPS FEVER, OR SITE BEGINS TO LOOK INFECTED.

## 2017-06-27 NOTE — Telephone Encounter (Signed)
Called patient

## 2017-06-27 NOTE — Telephone Encounter (Signed)
Patient returned call asked for a call back. The number to contact patient is [phone]

## 2017-06-29 ENCOUNTER — Telehealth (INDEPENDENT_AMBULATORY_CARE_PROVIDER_SITE_OTHER): Payer: Self-pay | Admitting: Orthopedic Surgery

## 2017-06-29 NOTE — Telephone Encounter (Signed)
Called patient left message on voicemail to return call concerning the 07/02/17 appointment

## 2017-07-02 ENCOUNTER — Ambulatory Visit (INDEPENDENT_AMBULATORY_CARE_PROVIDER_SITE_OTHER): Payer: 59 | Admitting: Orthopedic Surgery

## 2017-07-03 ENCOUNTER — Encounter (HOSPITAL_COMMUNITY)
Admission: RE | Disposition: A | Discharge status: Home Health | Payer: Self-pay | Source: Ambulatory Visit | Attending: Orthopedic Surgery

## 2017-07-03 ENCOUNTER — Encounter (HOSPITAL_COMMUNITY): Payer: Self-pay | Admitting: Certified Registered Nurse Anesthetist

## 2017-07-03 ENCOUNTER — Inpatient Hospital Stay (HOSPITAL_COMMUNITY)
Admission: RE | Admit: 2017-07-03 | Discharge: 2017-07-06 | DRG: 470 | Disposition: A | Discharge status: Home Health | Payer: 59 | Source: Ambulatory Visit | Attending: Orthopedic Surgery | Admitting: Orthopedic Surgery

## 2017-07-03 ENCOUNTER — Inpatient Hospital Stay (HOSPITAL_COMMUNITY): Payer: 59 | Admitting: Certified Registered Nurse Anesthetist

## 2017-07-03 DIAGNOSIS — M1710 Unilateral primary osteoarthritis, unspecified knee: Secondary | ICD-10-CM | POA: Diagnosis present

## 2017-07-03 DIAGNOSIS — Z803 Family history of malignant neoplasm of breast: Secondary | ICD-10-CM | POA: Diagnosis not present

## 2017-07-03 DIAGNOSIS — Z23 Encounter for immunization: Secondary | ICD-10-CM | POA: Diagnosis not present

## 2017-07-03 DIAGNOSIS — M1712 Unilateral primary osteoarthritis, left knee: Secondary | ICD-10-CM | POA: Diagnosis not present

## 2017-07-03 DIAGNOSIS — I739 Peripheral vascular disease, unspecified: Secondary | ICD-10-CM | POA: Diagnosis present

## 2017-07-03 DIAGNOSIS — F329 Major depressive disorder, single episode, unspecified: Secondary | ICD-10-CM | POA: Diagnosis present

## 2017-07-03 DIAGNOSIS — I83893 Varicose veins of bilateral lower extremities with other complications: Secondary | ICD-10-CM | POA: Diagnosis present

## 2017-07-03 DIAGNOSIS — Z6841 Body Mass Index (BMI) 40.0 and over, adult: Secondary | ICD-10-CM

## 2017-07-03 DIAGNOSIS — M25562 Pain in left knee: Secondary | ICD-10-CM | POA: Diagnosis not present

## 2017-07-03 DIAGNOSIS — Z90710 Acquired absence of both cervix and uterus: Secondary | ICD-10-CM

## 2017-07-03 DIAGNOSIS — G8918 Other acute postprocedural pain: Secondary | ICD-10-CM | POA: Diagnosis not present

## 2017-07-03 HISTORY — PX: TOTAL KNEE ARTHROPLASTY: SHX125

## 2017-07-03 SURGERY — ARTHROPLASTY, KNEE, TOTAL
Anesthesia: Regional | Laterality: Left

## 2017-07-03 MED ORDER — FENTANYL CITRATE (PF) 250 MCG/5ML IJ SOLN
INTRAMUSCULAR | Status: AC
  Filled 2017-07-03: qty 5

## 2017-07-03 MED ORDER — BUPIVACAINE LIPOSOME 1.3 % IJ SUSP
INTRAMUSCULAR | Status: DC | PRN
  Administered 2017-07-03: 20 mL

## 2017-07-03 MED ORDER — DEXAMETHASONE SODIUM PHOSPHATE 10 MG/ML IJ SOLN
INTRAMUSCULAR | Status: DC | PRN
  Administered 2017-07-03: 8 mg via INTRAVENOUS

## 2017-07-03 MED ORDER — MORPHINE SULFATE (PF) 4 MG/ML IV SOLN
INTRAVENOUS | Status: DC | PRN
  Administered 2017-07-03: 8 mg via INTRAVENOUS

## 2017-07-03 MED ORDER — METHOCARBAMOL 500 MG PO TABS
500.0000 mg | ORAL_TABLET | Freq: Four times a day (QID) | ORAL | Status: DC | PRN
  Administered 2017-07-03 – 2017-07-06 (×7): 500 mg via ORAL
  Filled 2017-07-03 (×5): qty 1

## 2017-07-03 MED ORDER — CEFAZOLIN SODIUM-DEXTROSE 2-4 GM/100ML-% IV SOLN
2.0000 g | Freq: Four times a day (QID) | INTRAVENOUS | Status: AC
  Administered 2017-07-03 – 2017-07-04 (×2): 2 g via INTRAVENOUS
  Filled 2017-07-03 (×2): qty 100

## 2017-07-03 MED ORDER — BUPROPION HCL ER (XL) 150 MG PO TB24
300.0000 mg | ORAL_TABLET | Freq: Every day | ORAL | Status: DC
  Administered 2017-07-04 – 2017-07-06 (×3): 300 mg via ORAL
  Filled 2017-07-03 (×3): qty 2

## 2017-07-03 MED ORDER — MORPHINE SULFATE (PF) 4 MG/ML IV SOLN
4.0000 mg | INTRAVENOUS | Status: DC | PRN
  Administered 2017-07-04 (×5): 4 mg via INTRAVENOUS
  Filled 2017-07-03 (×7): qty 1

## 2017-07-03 MED ORDER — PROPOFOL 10 MG/ML IV BOLUS
INTRAVENOUS | Status: AC
  Filled 2017-07-03: qty 20

## 2017-07-03 MED ORDER — BUPIVACAINE LIPOSOME 1.3 % IJ SUSP
20.0000 mL | INTRAMUSCULAR | Status: DC
  Filled 2017-07-03: qty 20

## 2017-07-03 MED ORDER — SODIUM CHLORIDE 0.9% FLUSH
INTRAVENOUS | Status: DC | PRN
  Administered 2017-07-03: 20 mL

## 2017-07-03 MED ORDER — BUPIVACAINE IN DEXTROSE 0.75-8.25 % IT SOLN
INTRATHECAL | Status: DC | PRN
  Administered 2017-07-03: 2 mL via INTRATHECAL

## 2017-07-03 MED ORDER — ONDANSETRON HCL 4 MG/2ML IJ SOLN: 4.0000 mg | Freq: Four times a day (QID) | INTRAMUSCULAR | Status: DC | PRN

## 2017-07-03 MED ORDER — METOCLOPRAMIDE HCL 5 MG/ML IJ SOLN: 5.0000 mg | Freq: Three times a day (TID) | INTRAMUSCULAR | Status: DC | PRN

## 2017-07-03 MED ORDER — TRANEXAMIC ACID 1000 MG/10ML IV SOLN
2000.0000 mg | Freq: Once | INTRAVENOUS | Status: DC
  Filled 2017-07-03: qty 20

## 2017-07-03 MED ORDER — DOCUSATE SODIUM 100 MG PO CAPS
100.0000 mg | ORAL_CAPSULE | Freq: Two times a day (BID) | ORAL | Status: DC
  Administered 2017-07-03 – 2017-07-06 (×6): 100 mg via ORAL
  Filled 2017-07-03 (×6): qty 1

## 2017-07-03 MED ORDER — ROPIVACAINE HCL 7.5 MG/ML IJ SOLN
INTRAMUSCULAR | Status: DC | PRN
  Administered 2017-07-03: 20 mL via PERINEURAL

## 2017-07-03 MED ORDER — METHOCARBAMOL 500 MG PO TABS
ORAL_TABLET | ORAL | Status: AC
  Filled 2017-07-03: qty 1

## 2017-07-03 MED ORDER — METOCLOPRAMIDE HCL 5 MG PO TABS: 5.0000 mg | ORAL_TABLET | Freq: Three times a day (TID) | ORAL | Status: DC | PRN

## 2017-07-03 MED ORDER — LACTATED RINGERS IV SOLN
INTRAVENOUS | Status: DC
  Administered 2017-07-03 (×2): via INTRAVENOUS

## 2017-07-03 MED ORDER — METHOCARBAMOL 1000 MG/10ML IJ SOLN: 500.0000 mg | Freq: Four times a day (QID) | INTRAVENOUS | Status: DC | PRN

## 2017-07-03 MED ORDER — MIDAZOLAM HCL 2 MG/2ML IJ SOLN
INTRAMUSCULAR | Status: AC
  Administered 2017-07-03: 2 mg
  Filled 2017-07-03: qty 2

## 2017-07-03 MED ORDER — PHENOL 1.4 % MT LIQD: 1.0000 | OROMUCOSAL | Status: DC | PRN

## 2017-07-03 MED ORDER — PROPOFOL 500 MG/50ML IV EMUL
INTRAVENOUS | Status: DC | PRN
  Administered 2017-07-03: 50 ug/kg/min via INTRAVENOUS

## 2017-07-03 MED ORDER — SODIUM CHLORIDE 0.9 % IR SOLN
Status: DC | PRN
  Administered 2017-07-03: 3000 mL

## 2017-07-03 MED ORDER — CEFAZOLIN SODIUM-DEXTROSE 2-4 GM/100ML-% IV SOLN
INTRAVENOUS | Status: AC
  Filled 2017-07-03: qty 100

## 2017-07-03 MED ORDER — ONDANSETRON HCL 4 MG/2ML IJ SOLN
INTRAMUSCULAR | Status: DC | PRN
  Administered 2017-07-03: 4 mg via INTRAVENOUS

## 2017-07-03 MED ORDER — ACETAMINOPHEN 325 MG PO TABS
650.0000 mg | ORAL_TABLET | ORAL | Status: DC | PRN
  Administered 2017-07-05: 650 mg via ORAL
  Filled 2017-07-03: qty 2

## 2017-07-03 MED ORDER — OXYCODONE HCL 5 MG PO TABS
ORAL_TABLET | ORAL | Status: AC
  Filled 2017-07-03: qty 2

## 2017-07-03 MED ORDER — DEXAMETHASONE SODIUM PHOSPHATE 10 MG/ML IJ SOLN
INTRAMUSCULAR | Status: AC
  Filled 2017-07-03: qty 1

## 2017-07-03 MED ORDER — FENTANYL CITRATE (PF) 100 MCG/2ML IJ SOLN
INTRAMUSCULAR | Status: AC
  Administered 2017-07-03: 50 ug
  Filled 2017-07-03: qty 2

## 2017-07-03 MED ORDER — PROPOFOL 1000 MG/100ML IV EMUL
INTRAVENOUS | Status: AC
  Filled 2017-07-03: qty 100

## 2017-07-03 MED ORDER — TRAZODONE HCL 100 MG PO TABS
100.0000 mg | ORAL_TABLET | Freq: Every day | ORAL | Status: DC
  Administered 2017-07-03 – 2017-07-05 (×3): 100 mg via ORAL
  Filled 2017-07-03 (×3): qty 1

## 2017-07-03 MED ORDER — RIVAROXABAN 10 MG PO TABS
10.0000 mg | ORAL_TABLET | Freq: Every day | ORAL | Status: DC
  Administered 2017-07-04 – 2017-07-06 (×3): 10 mg via ORAL
  Filled 2017-07-03 (×3): qty 1

## 2017-07-03 MED ORDER — MIDAZOLAM HCL 5 MG/5ML IJ SOLN
INTRAMUSCULAR | Status: DC | PRN
Start: 2017-07-03 — End: 2017-07-03
  Administered 2017-07-03 (×2): 1 mg via INTRAVENOUS

## 2017-07-03 MED ORDER — POTASSIUM CHLORIDE IN NACL 20-0.9 MEQ/L-% IV SOLN
INTRAVENOUS | Status: AC
  Administered 2017-07-03: 21:00:00 via INTRAVENOUS
  Filled 2017-07-03: qty 1000

## 2017-07-03 MED ORDER — ACETAMINOPHEN 650 MG RE SUPP: 650.0000 mg | RECTAL | Status: DC | PRN

## 2017-07-03 MED ORDER — 0.9 % SODIUM CHLORIDE (POUR BTL) OPTIME
TOPICAL | Status: DC | PRN
  Administered 2017-07-03 (×2): 1000 mL

## 2017-07-03 MED ORDER — BUPIVACAINE HCL (PF) 0.25 % IJ SOLN
INTRAMUSCULAR | Status: DC | PRN
  Administered 2017-07-03 (×2): 20 mL

## 2017-07-03 MED ORDER — TRIAMTERENE-HCTZ 37.5-25 MG PO TABS
1.0000 | ORAL_TABLET | Freq: Every day | ORAL | Status: DC
  Administered 2017-07-04 – 2017-07-06 (×3): 1 via ORAL
  Filled 2017-07-03 (×3): qty 1

## 2017-07-03 MED ORDER — ONDANSETRON HCL 4 MG PO TABS: 4.0000 mg | ORAL_TABLET | Freq: Four times a day (QID) | ORAL | Status: DC | PRN

## 2017-07-03 MED ORDER — SODIUM CHLORIDE 0.9 % IV SOLN
1000.0000 mg | INTRAVENOUS | Status: AC
  Administered 2017-07-03: 1000 mg via INTRAVENOUS
  Filled 2017-07-03: qty 10

## 2017-07-03 MED ORDER — ONDANSETRON HCL 4 MG/2ML IJ SOLN
INTRAMUSCULAR | Status: AC
  Filled 2017-07-03: qty 2

## 2017-07-03 MED ORDER — MORPHINE SULFATE (PF) 4 MG/ML IV SOLN
INTRAVENOUS | Status: AC
  Filled 2017-07-03: qty 2

## 2017-07-03 MED ORDER — OXYCODONE HCL 5 MG PO TABS
10.0000 mg | ORAL_TABLET | ORAL | Status: DC | PRN
  Administered 2017-07-03 – 2017-07-05 (×6): 10 mg via ORAL
  Filled 2017-07-03 (×5): qty 2

## 2017-07-03 MED ORDER — PHENYLEPHRINE HCL 10 MG/ML IJ SOLN
INTRAVENOUS | Status: DC | PRN
  Administered 2017-07-03: 20 ug/min via INTRAVENOUS

## 2017-07-03 MED ORDER — MENTHOL 3 MG MT LOZG: 1.0000 | LOZENGE | OROMUCOSAL | Status: DC | PRN

## 2017-07-03 MED ORDER — CEFAZOLIN SODIUM-DEXTROSE 2-4 GM/100ML-% IV SOLN
2.0000 g | INTRAVENOUS | Status: AC
  Administered 2017-07-03: 2 g via INTRAVENOUS
  Filled 2017-07-03: qty 100

## 2017-07-03 MED ORDER — CLONIDINE HCL (ANALGESIA) 100 MCG/ML EP SOLN
EPIDURAL | Status: DC | PRN
  Administered 2017-07-03: 150 ug

## 2017-07-03 MED ORDER — CHLORHEXIDINE GLUCONATE 4 % EX LIQD
60.0000 mL | Freq: Once | CUTANEOUS | Status: DC
  Administered 2017-07-03: 4 via TOPICAL

## 2017-07-03 MED ORDER — MIDAZOLAM HCL 2 MG/2ML IJ SOLN
INTRAMUSCULAR | Status: AC
  Filled 2017-07-03: qty 2

## 2017-07-03 MED ORDER — BUPIVACAINE HCL (PF) 0.25 % IJ SOLN
INTRAMUSCULAR | Status: AC
  Filled 2017-07-03: qty 40

## 2017-07-03 SURGICAL SUPPLY — 78 items
BANDAGE ACE 4X5 VEL STRL LF (GAUZE/BANDAGES/DRESSINGS) ×2 IMPLANT
BANDAGE ELASTIC 6 VELCRO ST LF (GAUZE/BANDAGES/DRESSINGS) ×2 IMPLANT
BANDAGE ESMARK 6X9 LF (GAUZE/BANDAGES/DRESSINGS) ×1 IMPLANT
BLADE SAG 18X100X1.27 (BLADE) ×2 IMPLANT
BLADE SAW SGTL 13.0X1.19X90.0M (BLADE) IMPLANT
BNDG CMPR 9X6 STRL LF SNTH (GAUZE/BANDAGES/DRESSINGS) ×1
BNDG CMPR MED 15X6 ELC VLCR LF (GAUZE/BANDAGES/DRESSINGS) ×3
BNDG COHESIVE 6X5 TAN STRL LF (GAUZE/BANDAGES/DRESSINGS) ×2 IMPLANT
BNDG ELASTIC 6X15 VLCR STRL LF (GAUZE/BANDAGES/DRESSINGS) ×6 IMPLANT
BNDG ESMARK 6X9 LF (GAUZE/BANDAGES/DRESSINGS) ×2
BOWL SMART MIX CTS (DISPOSABLE) IMPLANT
CAPT KNEE TRIATH TK-4 ×2 IMPLANT
CONT SPECI 4OZ STER CLIK (MISCELLANEOUS) ×2 IMPLANT
COVER SURGICAL LIGHT HANDLE (MISCELLANEOUS) ×2 IMPLANT
CUFF TOURNIQUET SINGLE 34IN LL (TOURNIQUET CUFF) ×2 IMPLANT
CUFF TOURNIQUET SINGLE 44IN (TOURNIQUET CUFF) IMPLANT
DECANTER SPIKE VIAL GLASS SM (MISCELLANEOUS) ×2 IMPLANT
DRAPE INCISE IOBAN 66X45 STRL (DRAPES) IMPLANT
DRAPE ORTHO SPLIT 77X108 STRL (DRAPES) ×6
DRAPE SURG ORHT 6 SPLT 77X108 (DRAPES) ×3 IMPLANT
DRAPE U-SHAPE 47X51 STRL (DRAPES) ×2 IMPLANT
DRESSING AQUACEL AG SP 3.5X10 (GAUZE/BANDAGES/DRESSINGS) IMPLANT
DRSG AQUACEL AG ADV 3.5X10 (GAUZE/BANDAGES/DRESSINGS) ×2 IMPLANT
DRSG AQUACEL AG ADV 3.5X14 (GAUZE/BANDAGES/DRESSINGS) IMPLANT
DRSG AQUACEL AG SP 3.5X10 (GAUZE/BANDAGES/DRESSINGS) ×2
DRSG PAD ABDOMINAL 8X10 ST (GAUZE/BANDAGES/DRESSINGS) ×4 IMPLANT
DURAPREP 26ML APPLICATOR (WOUND CARE) ×4 IMPLANT
ELECT CAUTERY BLADE 6.4 (BLADE) ×2 IMPLANT
ELECT REM PT RETURN 9FT ADLT (ELECTROSURGICAL) ×2
ELECTRODE REM PT RTRN 9FT ADLT (ELECTROSURGICAL) ×1 IMPLANT
GAUZE SPONGE 4X4 12PLY STRL (GAUZE/BANDAGES/DRESSINGS) ×2 IMPLANT
GLOVE BIOGEL PI IND STRL 7.5 (GLOVE) ×1 IMPLANT
GLOVE BIOGEL PI IND STRL 8 (GLOVE) ×1 IMPLANT
GLOVE BIOGEL PI INDICATOR 7.5 (GLOVE) ×1
GLOVE BIOGEL PI INDICATOR 8 (GLOVE) ×1
GLOVE ECLIPSE 7.0 STRL STRAW (GLOVE) ×2 IMPLANT
GLOVE SURG ORTHO 8.0 STRL STRW (GLOVE) ×2 IMPLANT
GOWN STRL REUS W/ TWL LRG LVL3 (GOWN DISPOSABLE) ×3 IMPLANT
GOWN STRL REUS W/TWL LRG LVL3 (GOWN DISPOSABLE) ×6
HANDPIECE INTERPULSE COAX TIP (DISPOSABLE)
HOOD PEEL AWAY FLYTE STAYCOOL (MISCELLANEOUS) ×6 IMPLANT
IMMOBILIZER KNEE 20 (SOFTGOODS) ×2 IMPLANT
IMMOBILIZER KNEE 20 THIGH 36 (SOFTGOODS) ×1 IMPLANT
IMMOBILIZER KNEE 22 UNIV (SOFTGOODS) IMPLANT
IMMOBILIZER KNEE 24 THIGH 36 (MISCELLANEOUS) IMPLANT
IMMOBILIZER KNEE 24 UNIV (MISCELLANEOUS)
KIT BASIN OR (CUSTOM PROCEDURE TRAY) ×2 IMPLANT
KIT ROOM TURNOVER OR (KITS) ×2 IMPLANT
MANIFOLD NEPTUNE II (INSTRUMENTS) ×2 IMPLANT
NDL SAFETY ECLIPSE 18X1.5 (NEEDLE) ×1 IMPLANT
NEEDLE HYPO 18GX1.5 SHARP (NEEDLE) ×2
NEEDLE HYPO 22GX1.5 SAFETY (NEEDLE) ×2 IMPLANT
NEEDLE SPNL 18GX3.5 QUINCKE PK (NEEDLE) ×2 IMPLANT
NS IRRIG 1000ML POUR BTL (IV SOLUTION) ×4 IMPLANT
PACK TOTAL JOINT (CUSTOM PROCEDURE TRAY) ×2 IMPLANT
PAD ABD 8X10 STRL (GAUZE/BANDAGES/DRESSINGS) ×2 IMPLANT
PAD ARMBOARD 7.5X6 YLW CONV (MISCELLANEOUS) ×4 IMPLANT
PAD CAST 4YDX4 CTTN HI CHSV (CAST SUPPLIES) ×1 IMPLANT
PADDING CAST COTTON 4X4 STRL (CAST SUPPLIES) ×2
PADDING CAST COTTON 6X4 STRL (CAST SUPPLIES) ×2 IMPLANT
SET HNDPC FAN SPRY TIP SCT (DISPOSABLE) IMPLANT
SPONGE LAP 18X18 X RAY DECT (DISPOSABLE) IMPLANT
STRIP CLOSURE SKIN 1/2X4 (GAUZE/BANDAGES/DRESSINGS) ×2 IMPLANT
SUCTION FRAZIER HANDLE 10FR (MISCELLANEOUS) ×1
SUCTION TUBE FRAZIER 10FR DISP (MISCELLANEOUS) ×1 IMPLANT
SUT MNCRL AB 3-0 PS2 18 (SUTURE) ×2 IMPLANT
SUT VIC AB 0 CT1 27 (SUTURE) ×6
SUT VIC AB 0 CT1 27XBRD ANBCTR (SUTURE) ×3 IMPLANT
SUT VIC AB 1 CT1 27 (SUTURE) ×10
SUT VIC AB 1 CT1 27XBRD ANBCTR (SUTURE) ×5 IMPLANT
SUT VIC AB 2-0 CT1 27 (SUTURE) ×8
SUT VIC AB 2-0 CT1 TAPERPNT 27 (SUTURE) ×4 IMPLANT
SYR 30ML LL (SYRINGE) ×6 IMPLANT
SYR TB 1ML LUER SLIP (SYRINGE) ×2 IMPLANT
TOWEL OR 17X24 6PK STRL BLUE (TOWEL DISPOSABLE) ×4 IMPLANT
TOWEL OR 17X26 10 PK STRL BLUE (TOWEL DISPOSABLE) ×4 IMPLANT
TRAY CATH 16FR W/PLASTIC CATH (SET/KITS/TRAYS/PACK) IMPLANT
WATER STERILE IRR 1000ML POUR (IV SOLUTION) IMPLANT

## 2017-07-03 NOTE — Anesthesia Procedure Notes (Signed)
Anesthesia Regional Block:  Pre-Anesthetic Checklist: ,, timeout performed, Correct Patient, Correct Site, Correct Laterality, Correct Procedure, Correct Position, site marked, Risks and benefits discussed,  Surgical consent,  Pre-op evaluation,  At surgeon's request and post-op pain management  Laterality: Left  Prep: chloraprep       Needles:  Injection technique: Single-shot  Needle Type: Echogenic Needle     Needle Length: 9cm  Needle Gauge: 21     Additional Needles:   Procedures:,,,, ultrasound used (permanent image in chart),,,,  Narrative:  Start time: 07/03/2017 11:20 AM End time: 07/03/2017 11:25 AM Injection made incrementally with aspirations every 5 mL.  Performed by: Personally  Anesthesiologist: Suzette Battiest, MD

## 2017-07-03 NOTE — Progress Notes (Signed)
Orthopedic Tech Progress Note Patient Details:  Mary Morrison 03-Oct-1965 [id number]  CPM Left Knee CPM Left Knee: On Left Knee Flexion (Degrees): 40 Left Knee Extension (Degrees): 0  Post Interventions Patient Tolerated: Well Instructions Provided: Care of device Ortho Devices Type of Ortho Device: Bone foam zero knee, CPM padding Ortho Device/Splint Location: applied ohf to bed Ortho Device/Splint Interventions: Ordered, Application, Adjustment   Post Interventions Patient Tolerated: Well Instructions Provided: Care of device   Braulio Bosch 07/03/2017, 3:34 PM

## 2017-07-03 NOTE — Brief Op Note (Signed)
07/03/2017  2:50 PM  PATIENT:  Mary Morrison  51 y.o. female  PRE-OPERATIVE DIAGNOSIS:  left knee osteoarthritis  POST-OPERATIVE DIAGNOSIS:  left knee osteoarthritis  PROCEDURE:  Procedure(s): LEFT TOTAL KNEE ARTHROPLASTY  SURGEON:  Surgeon(s): Marlou Sa, Tonna Corner, MD  ASSISTANT: Laure Kidney rnfa  ANESTHESIA:   spinal  EBL: 75 ml    Total I/O In: -  Out: 75 [Blood:75]  BLOOD ADMINISTERED: none  DRAINS: none   LOCAL MEDICATIONS USED:  Marcaine mso4 clonidine exparel  SPECIMEN:  No Specimen  COUNTS:  YES  TOURNIQUET:   Total Tourniquet Time Documented: Thigh (Left) - 97 minutes Total: Thigh (Left) - 97 minutes   DICTATION: .Other Dictation: Dictation Number [id number]  PLAN OF CARE: Admit to inpatient   PATIENT DISPOSITION:  PACU - hemodynamically stable

## 2017-07-03 NOTE — Transfer of Care (Signed)
Immediate Anesthesia Transfer of Care Note  Patient: Mary Morrison  Procedure(s) Performed: LEFT TOTAL KNEE ARTHROPLASTY (Left )  Patient Location: PACU  Anesthesia Type:Regional and Spinal  Level of Consciousness: awake, oriented and patient cooperative  Airway & Oxygen Therapy: Patient Spontanous Breathing and Patient connected to face mask oxygen  Post-op Assessment: Report given to RN and Post -op Vital signs reviewed and stable  Post vital signs: Reviewed and stable  Last Vitals:  Vitals:   07/03/17 0929  BP: 126/88  Pulse: 82  Resp: 18  Temp: 36.6 C  SpO2: 98%    Last Pain:  Vitals:   07/03/17 0953  TempSrc:   PainSc: 5          Complications: No apparent anesthesia complications

## 2017-07-03 NOTE — Progress Notes (Signed)
Will hold off on catheter in left leg Small area distal to knee from sclerotherapy not infected no drainage

## 2017-07-03 NOTE — H&P (Signed)
TOTAL KNEE ADMISSION H&P  Patient is being admitted for left total knee arthroplasty.  Subjective:  Chief Complaint:left knee pain.  HPI: Mary Morrison, 51 y.o. female, has a history of pain and functional disability in the left knee due to arthritis and has failed non-surgical conservative treatments for greater than 12 weeks to includeNSAID's and/or analgesics, corticosteriod injections, viscosupplementation injections, use of assistive devices and activity modification.  Onset of symptoms was gradual, starting 8 years ago with gradually worsening course since that time. The patient noted no past surgery on the left knee(s).  Patient currently rates pain in the left knee(s) at 9 out of 10 with activity. Patient has night pain, worsening of pain with activity and weight bearing, pain that interferes with activities of daily living, pain with passive range of motion, crepitus and joint swelling.  Patient has evidence of subchondral sclerosis and joint space narrowing by imaging studies. This patient has had significant and worsening pain over the last 6 months. There is no active infection.  Patient Active Problem List   Diagnosis Date Noted  . Varicose veins of bilateral lower extremities with other complications 60/04/9322  . Acute pain of left knee 01/25/2017  . Swelling of left knee joint 01/25/2017  . S/P hysterectomy 09/21/2014  . Lapband APS July 2015 02/17/2014  . Edema-chronic pitting in legs 01/20/2014  . Morbid obesity ([place]) 08/28/2013   Past Medical History:  Diagnosis Date  . Arthritis   . Asthma    EXERCISED INDUCED 2014   . Depression   . Fibroids   . Obesity   . Seasonal allergies   . Varicose vein of leg     Past Surgical History:  Procedure Laterality Date  . BILATERAL SALPINGECTOMY Bilateral 09/21/2014   Procedure: BILATERAL SALPINGECTOMY;  Surgeon: Marvene Staff, MD;  Location: [facility];  Service: Gynecology;  Laterality: Bilateral;  . CESAREAN SECTION   1989  . COLONOSCOPY    . LAPAROSCOPIC GASTRIC BANDING N/A 02/17/2014   Procedure: LAPAROSCOPIC GASTRIC BANDING;  Surgeon: Pedro Earls, MD;  Location: [facility];  Service: General;  Laterality: N/A;  . ROBOT ASSISTED MYOMECTOMY N/A 09/21/2014   Procedure: ROBOTIC ASSISTED MYOMECTOMY;  Surgeon: Marvene Staff, MD;  Location: [facility];  Service: Gynecology;  Laterality: N/A;  . ROBOTIC ASSISTED TOTAL HYSTERECTOMY N/A 09/21/2014   Procedure: ROBOTIC ASSISTED TOTAL HYSTERECTOMY, EXCISION OF PELVIC ENDOMETRIOSIS;  Surgeon: Marvene Staff, MD;  Location: [facility];  Service: Gynecology;  Laterality: N/A;  . TONSILLECTOMY      Current Facility-Administered Medications  Medication Dose Route Frequency Provider Last Rate Last Dose  . ceFAZolin (ANCEF) IVPB 2g/100 mL premix  2 g Intravenous On Call to OR Meredith Pel, MD      . chlorhexidine (HIBICLENS) 4 % liquid 4 application  60 mL Topical Once Meredith Pel, MD      . fentaNYL (SUBLIMAZE) 100 MCG/2ML injection           . lactated ringers infusion   Intravenous Continuous Suzette Battiest, MD 50 mL/hr at 07/03/17 1003    . midazolam (VERSED) 2 MG/2ML injection            No Known Allergies  Social History   Tobacco Use  . Smoking status: Never Smoker  . Smokeless tobacco: Never Used  Substance Use Topics  . Alcohol use: No    Family History  Problem Relation Age of Onset  . Cancer Mother        breast/bilateral  mastectomies  . Diabetes Other   . Hypertension Other   . Stroke Other   . Obesity Other      ROS  Objective:  Physical Exam  Vital signs in last 24 hours: Temp:  [97.8 F (36.6 C)] 97.8 F (36.6 C) (12/11 0929) Pulse Rate:  [82] 82 (12/11 0929) Resp:  [18] 18 (12/11 0929) BP: (126)/(88) 126/88 (12/11 0929) SpO2:  [98 %] 98 % (12/11 0929) Weight:  [255 lb (115.7 kg)] 255 lb (115.7 kg) (12/11 0953)  Labs:   Estimated body mass index is 41.16 kg/m as calculated from the following:   Height  as of this encounter: 5\' 6"  (1.676 m).   Weight as of this encounter: 255 lb (115.7 kg).   Imaging Review Plain radiographs demonstrate moderate degenerative joint disease of the left knee(s). The overall alignment ismild varus. The bone quality appears to be good for age and reported activity level.  Assessment/Plan:  End stage arthritis, left knee   The patient history, physical examination, clinical judgment of the provider and imaging studies are consistent with end stage degenerative joint disease of the left knee(s) and total knee arthroplasty is deemed medically necessary. The treatment options including medical management, injection therapy arthroscopy and arthroplasty were discussed at length. The risks and benefits of total knee arthroplasty were presented and reviewed. The risks due to aseptic loosening, infection, stiffness, patella tracking problems, thromboembolic complications and other imponderables were discussed. The patient acknowledged the explanation, agreed to proceed with the plan and consent was signed. Patient is being admitted for inpatient treatment for surgery, pain control, PT, OT, prophylactic antibiotics, VTE prophylaxis, progressive ambulation and ADL's and discharge planning. The patient is planning to be discharged home with home health servicesPain management will be tricky in this patient as she wants to avoid as much pain medicine as possible due to prior addiction history.

## 2017-07-03 NOTE — Anesthesia Preprocedure Evaluation (Addendum)
Anesthesia Evaluation  Patient identified by MRN, date of birth, ID band Patient awake    Reviewed: Allergy & Precautions, NPO status , Patient's Chart, lab work & pertinent test results  Airway Mallampati: II  TM Distance: >3 FB Neck ROM: Full    Dental   Pulmonary asthma ,    breath sounds clear to auscultation       Cardiovascular + Peripheral Vascular Disease   Rhythm:Regular Rate:Normal     Neuro/Psych Depression negative neurological ROS     GI/Hepatic negative GI ROS, Neg liver ROS,   Endo/Other  Morbid obesity  Renal/GU      Musculoskeletal  (+) Arthritis ,   Abdominal   Peds  Hematology negative hematology ROS (+)   Anesthesia Other Findings   Reproductive/Obstetrics                             Lab Results  Component Value Date   WBC 6.9 06/27/2017   HGB 13.8 06/27/2017   HCT 42.7 06/27/2017   MCV 103.6 (H) 06/27/2017   PLT 251 06/27/2017   Lab Results  Component Value Date   CREATININE 0.93 06/27/2017   BUN 11 06/27/2017   NA 136 06/27/2017   K 3.5 06/27/2017   CL 100 (L) 06/27/2017   CO2 26 06/27/2017   No results found for: INR, PROTIME  Anesthesia Physical Anesthesia Plan  ASA: III  Anesthesia Plan: Spinal   Post-op Pain Management:  Regional for Post-op pain   Induction: Intravenous  PONV Risk Score and Plan: 2 and Propofol infusion, Ondansetron and Treatment may vary due to age or medical condition  Airway Management Planned: Natural Airway and Simple Face Mask  Additional Equipment:   Intra-op Plan:   Post-operative Plan:   Informed Consent: I have reviewed the patients History and Physical, chart, labs and discussed the procedure including the risks, benefits and alternatives for the proposed anesthesia with the patient or authorized representative who has indicated his/her understanding and acceptance.     Plan Discussed with:  CRNA  Anesthesia Plan Comments:        Anesthesia Quick Evaluation

## 2017-07-03 NOTE — Anesthesia Procedure Notes (Signed)
Spinal  Patient location during procedure: OR Start time: 07/03/2017 11:40 AM End time: 07/03/2017 11:45 AM Staffing Anesthesiologist: Suzette Battiest, MD Performed: anesthesiologist  Preanesthetic Checklist Completed: patient identified, site marked, surgical consent, pre-op evaluation, timeout performed, IV checked, risks and benefits discussed and monitors and equipment checked Spinal Block Patient position: sitting Prep: site prepped and draped and DuraPrep Patient monitoring: blood pressure, continuous pulse ox and heart rate Approach: midline Location: L4-5 Injection technique: single-shot Needle Needle type: Pencan  Needle gauge: 24 G Needle length: 9 cm Needle insertion depth: 7 cm

## 2017-07-04 ENCOUNTER — Encounter (HOSPITAL_COMMUNITY): Payer: Self-pay | Admitting: Orthopedic Surgery

## 2017-07-04 NOTE — Evaluation (Signed)
Physical Therapy Evaluation Patient Details Name: Mary Morrison MRN: [id number] DOB: [birth year] Today's Date: 07/04/2017   History of Present Illness  Pt is a 51 y/o female who presents s/p elective L TKA on 07/03/17.   Clinical Impression  Pt admitted with above diagnosis. Pt currently with functional limitations due to the deficits listed below (see PT Problem List). At the time of PT eval pt was able to perform transfers and ambulation with gross min guard assist for balance support and safety. Knee immobilizer donned this session for added knee stability, however will plan to attempt OOB mobility without KI during afternoon session. Pt will benefit from skilled PT to increase their independence and safety with mobility to allow discharge to the venue listed below.       Follow Up Recommendations DC plan and follow up therapy as arranged by surgeon;Home health PT    Equipment Recommendations  Rolling walker with 5" wheels    Recommendations for Other Services       Precautions / Restrictions Precautions Precautions: Fall;Knee Precaution Booklet Issued: Yes (comment) Precaution Comments: Reviewed exercise handout with pt. She was educated on proper positioning with NO roll/ice pack/pillow under knee, only under ankle. Required Braces or Orthoses: Knee Immobilizer - Left Restrictions Weight Bearing Restrictions: Yes LLE Weight Bearing: Weight bearing as tolerated      Mobility  Bed Mobility Overal bed mobility: Needs Assistance Bed Mobility: Supine to Sit     Supine to sit: Min guard     General bed mobility comments: Close guard for safety as pt transitions to EOB. VC's for sequencing and general safety throughout.   Transfers Overall transfer level: Needs assistance Equipment used: Rolling walker (2 wheeled) Transfers: Sit to/from Stand Sit to Stand: Min assist         General transfer comment: Assist initially to power-up to full stand from EOB. From [facility]  later in session did not require assistance. VC's for hand placement on seated surface for safety.   Ambulation/Gait Ambulation/Gait assistance: Min guard Ambulation Distance (Feet): 25 Feet Assistive device: Rolling walker (2 wheeled) Gait Pattern/deviations: Step-through pattern;Decreased stride length;Decreased dorsiflexion - left;Decreased stance time - left Gait velocity: Decreased Gait velocity interpretation: Below normal speed for age/gender General Gait Details: VC's for sequencing and general safety with the RW. Pt ambulated in room only.   Stairs            Wheelchair Mobility    Modified Rankin (Stroke Patients Only)       Balance Overall balance assessment: Needs assistance Sitting-balance support: Feet supported;No upper extremity supported Sitting balance-Leahy Scale: Fair     Standing balance support: During functional activity;No upper extremity supported Standing balance-Leahy Scale: Fair Standing balance comment: Pt able to stand at sink to wash hands and wash face/head without LOB                             Pertinent Vitals/Pain Pain Assessment: Faces Faces Pain Scale: Hurts even more Pain Location: L knee Pain Descriptors / Indicators: Operative site guarding;Aching Pain Intervention(s): Limited activity within patient's tolerance;Monitored during session;Repositioned    Home Living Family/patient expects to be discharged to:: Private residence Living Arrangements: Alone Available Help at Discharge: Friend(s);Available 24 hours/day Type of Home: House Home Access: Stairs to enter   [employer] of Steps: 2 Home Layout: Two level;Able to live on main level with bedroom/bathroom Home Equipment: None Additional Comments: 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 Will have a friend staying wtih her 24 hours initially    Prior Function Level of Independence: Independent               Hand Dominance   Dominant  Hand: Right    Extremity/Trunk Assessment   Upper Extremity Assessment Upper Extremity Assessment: Defer to OT evaluation    Lower Extremity Assessment Lower Extremity Assessment: LLE deficits/detail LLE Deficits / Details: Decreased strength and AROM consistent with above mentioned procedure.     Cervical / Trunk Assessment Cervical / Trunk Assessment: Normal  Communication   Communication: No difficulties  Cognition Arousal/Alertness: Awake/alert Behavior During Therapy: WFL for tasks assessed/performed Overall Cognitive Status: Within Functional Limits for tasks assessed                                        General Comments      Exercises Total Joint Exercises Ankle Circles/Pumps: 20 reps Quad Sets: 15 reps Heel Slides: 15 reps Goniometric ROM: 57 flexion AROM in sitting L knee   Assessment/Plan    PT Assessment Patient needs continued PT services  PT Problem List Decreased strength;Decreased range of motion;Decreased activity tolerance;Decreased balance;Decreased mobility;Decreased knowledge of use of DME;Decreased safety awareness;Decreased knowledge of precautions;Pain       PT Treatment Interventions DME instruction;Gait training;Stair training;Functional mobility training;Therapeutic activities;Therapeutic exercise;Neuromuscular re-education;Patient/family education    PT Goals (Current goals can be found in the Care Plan section)  Acute Rehab PT Goals Patient Stated Goal: Home at d/c - 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 : A Little Difficulty  sitting down on and standing up from a chair with arms (e.g., wheelchair, bedside commode, etc,.)?: A Little Help needed moving to and from a bed to chair (including a wheelchair)?: A Little Help needed walking in hospital room?: A Little Help needed climbing 3-5 steps with a railing? : A Lot 6 Click Score: 18    End of Session Equipment Utilized During Treatment: Gait belt;Left knee immobilizer Activity Tolerance: Patient tolerated treatment well Patient left: in chair;with call bell/phone within reach;with chair alarm set Nurse Communication: Mobility status PT Visit Diagnosis: Unsteadiness on feet (R26.81);Pain;Difficulty in walking, not elsewhere classified (R26.2) Pain - Right/Left: Left Pain - part of body: Knee    Time: 0820-0855 PT Time Calculation (min) (ACUTE ONLY): 35 min   Charges:   PT Evaluation $PT Eval Moderate Complexity: 1 Mod PT Treatments $Gait Training: 8-22 mins   PT G Codes:        Rolinda Roan, PT, DPT Acute Rehabilitation Services Pager: [phone]   Thelma Comp 07/04/2017, 10:32 AM

## 2017-07-04 NOTE — Progress Notes (Signed)
Subjective: Patient stable.  Pain controlled.  Does better with morphine and then oxycodone   Objective: Vital signs in last 24 hours: Temp:  [97.7 F (36.5 C)-98 F (36.7 C)] 98 F (36.7 C) (12/12 0237) Pulse Rate:  [62-86] 86 (12/12 0237) Resp:  [9-18] 17 (12/12 0237) BP: (115-133)/(71-88) 121/72 (12/12 0237) SpO2:  [95 %-98 %] 98 % (12/12 0237) Weight:  [255 lb (115.7 kg)] 255 lb (115.7 kg) (12/11 0953)  Intake/Output from previous day: 12/11 0701 - 12/12 0700 In: 2076.3 [P.O.:240; I.V.:1736.3; IV Piggyback:100] Out: 1525 [Urine:1450; Blood:75] Intake/Output this shift: No intake/output data recorded.  Exam:  Intact pulses distally Dorsiflexion/Plantar flexion intact  Labs: No results for input(s): HGB in the last 72 hours. No results for input(s): WBC, RBC, HCT, PLT in the last 72 hours. No results for input(s): NA, K, CL, CO2, BUN, CREATININE, GLUCOSE, CALCIUM in the last 72 hours. No results for input(s): LABPT, INR in the last 72 hours.  Assessment/Plan: Patient is still requiring IV pain medicine.  She has not been up with physical therapy.  I do not think she is safe for discharge to home today.  We need physical therapy assessment and attempt at oral pain medicine for pain control beginning later today and tomorrow.  She may be able to be discharged home tomorrow.   Landry Dyke  07/04/2017, 8:34 AM

## 2017-07-04 NOTE — Op Note (Signed)
NAME:  Mary Morrison, Mary Morrison                 ACCOUNT NO.:  MEDICAL RECORD NO.:  [id number]  LOCATION:                                 FACILITY:  PHYSICIAN:  Anderson Malta, M.D.         DATE OF BIRTH:  DATE OF PROCEDURE: DATE OF DISCHARGE:                              OPERATIVE REPORT   PREOPERATIVE DIAGNOSIS:  Left knee arthritis.  POSTOPERATIVE DIAGNOSIS:  Left knee arthritis.  PROCEDURE:  Left total knee replacement.  SURGEON:  Anderson Malta, M.D.  ASSISTANT:  Laure Kidney, RNFA.  INDICATIONS:  Laurena is a 51 year old patient with end-stage left knee arthritis, presents for operative management after explanation of risks and benefits.  PROCEDURE IN DETAIL:  The patient was brought to the operating room, where spinal anesthetic was induced.  Preoperative antibiotics administered.  Time-out was called.  Left leg was prescrubbed with alcohol and Betadine and allowed to air dry, prepped with DuraPrep solution and draped in sterile manner.  Ioban used to cover the operative field.  Leg was then elevated and exsanguinated with the Esmarch wrap.  Tourniquet was inflated to 325 mmHg.  Time-out was called.  Anterior approach to knee was made.  Skin and subcu tissues were sharply divided.  Median parapatellar approach was made and then marked with a #1 Vicryl suture.  Patella had to be subluxated, could not be everted.  Lateral patellofemoral ligament was released.  Soft tissue dissection performed medially around to the semimembranosus bursa.  Soft tissue removed from the anterior distal femur.  There was some inflammatory component to the synovium, which was removed.  Following this, the knee was flexed.  Intramedullary alignment was utilized to cut the tibia in 3 degrees of posterior slope.  Initially, a 9 mm cut was made off the tibia perpendicular to the mechanical axis.  This was revised down 2 more mm in order to get to less eburnated bone.  In a similar manner, the femur was  initially cut 8 mm then to 10 mm in order to get to less eburnated bone on the medial side.  After those cuts were made, the patient had full extension with both a 9 and 11 mm spacer with good alignment noted.  Using intramedullary alignment, the femur was then cut to size 5.  Chamfer and anterior and posterior cuts were made with the collaterals protected.  PCL was preserved on the tibial cut. The tibia was then keel punched and press-fit.  Trial components placed. Patella then cut down from 26 down to 14 and a 3-peg 32 mm patella was placed.  With trial components in position including the 9 mm spacer, the patient achieved about 2 degrees of hyperextension, full flexion with excellent patellar tracking.  Trial components were removed. Thorough irrigation performed.  Exparel solution used to numb up the capsule.  Skin edges then anesthetized using Marcaine, morphine, clonidine.  The tranexamic acid sponge was then allowed to sit for 3 minutes in the incision.  This was after 3 liters of irrigating solution was performed.  At this time, true components were press-fit into good position and alignment.  Same stability parameters and excellent patellar tracking  were maintained.  Tourniquet was released.  Bleeding points encountered were controlled electrocautery using electrocautery.  Knee closed over bolster using #1 Vicryl suture, 0 Vicryl suture, 2-0 Vicryl suture, and 3-0 Monocryl.  Steri-Strips, Aquacel dressing, knee immobilizer placed.  The patient was transferred to the recovery room in stable condition.     Anderson Malta, M.D.     GSD/MEDQ  D:  07/03/2017  T:  07/03/2017  Job:  [id number]

## 2017-07-04 NOTE — Plan of Care (Signed)
  Coping: Level of anxiety will decrease 07/04/2017 1134 - Progressing by Williams Che, RN   Pain Managment: General experience of comfort will improve 07/04/2017 1134 - Progressing by Williams Che, RN   Safety: Ability to remain free from injury will improve 07/04/2017 1134 - Progressing by Williams Che, RN

## 2017-07-04 NOTE — Progress Notes (Signed)
Physical Therapy Treatment Patient Details Name: Mary Morrison MRN: [id number] DOB: 1965/08/06 Today's Date: 07/04/2017    History of Present Illness Pt is a 51 y/o female who presents s/p elective L TKA on 07/03/17.     PT Comments    Pt progressing towards physical therapy goals. Somewhat lethargic in afternoon session - reports she had pain medicine recently. Pt was only able to tolerate ambulation and bathroom trip to void. Increased time required for all aspects of mobility. Will continue to follow and progress as able per POC.   Follow Up Recommendations  DC plan and follow up therapy as arranged by surgeon;Home health PT     Equipment Recommendations  Rolling walker with 5" wheels    Recommendations for Other Services       Precautions / Restrictions Precautions Precautions: Fall;Knee Precaution Booklet Issued: Yes (comment) Precaution Comments: Reviewed exercise handout with pt. She was educated on proper positioning with NO roll/ice pack/pillow under knee, only under ankle. Restrictions Weight Bearing Restrictions: Yes LLE Weight Bearing: Weight bearing as tolerated    Mobility  Bed Mobility Overal bed mobility: Needs Assistance Bed Mobility: Sit to Supine;Supine to Sit     Supine to sit: Min guard Sit to supine: Min assist   General bed mobility comments: assist for L LE into bed  Transfers Overall transfer level: Needs assistance Equipment used: Rolling walker (2 wheeled) Transfers: Sit to/from Stand Sit to Stand: Min assist         General transfer comment: Assist initially to power-up to full stand from EOB. From [facility] later in session did not require assistance. VC's for hand placement on seated surface for safety.   Ambulation/Gait Ambulation/Gait assistance: Min guard Ambulation Distance (Feet): 75 Feet Assistive device: Rolling walker (2 wheeled) Gait Pattern/deviations: Decreased stride length;Decreased dorsiflexion - left;Decreased  stance time - left;Step-to pattern Gait velocity: Decreased Gait velocity interpretation: Below normal speed for age/gender General Gait Details: VC's for sequencing and general safety with the RW. Pt ambulated in room ~25 feet and in hall ~50 feet.    Stairs            Wheelchair Mobility    Modified Rankin (Stroke Patients Only)       Balance Overall balance assessment: Needs assistance Sitting-balance support: Feet supported;No upper extremity supported Sitting balance-Leahy Scale: Fair     Standing balance support: During functional activity;No upper extremity supported Standing balance-Leahy Scale: Fair Standing balance comment: Pt able to stand at sink to wash hands and wash face/head without LOB                            Cognition Arousal/Alertness: Lethargic;Suspect due to medications Behavior During Therapy: [facility] for tasks assessed/performed Overall Cognitive Status: Within Functional Limits for tasks assessed                                        Exercises      General Comments        Pertinent Vitals/Pain Pain Assessment: Faces Faces Pain Scale: Hurts whole lot Pain Location: L knee Pain Descriptors / Indicators: Operative site guarding;Aching Pain Intervention(s): Monitored during session    Home Living Family/patient expects to be discharged to:: Private residence Living Arrangements: Alone Available Help at Discharge: Friend(s);Available 24 hours/day(x 1 week) Type of Home: House Home Access: Stairs to enter  Home Layout: Two level;Able to live on main level with bedroom/bathroom Home Equipment: Adaptive equipment Additional Comments: 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 Will have a friend staying with her 24 hours initially. Mom has a 3 in 1 she may be able to borrow.    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 : A Little Difficulty sitting down on and standing up from a chair with arms (e.g., wheelchair, bedside commode, etc,.)?: A Little Help needed moving to and from a bed to chair (including a wheelchair)?: A Little Help needed walking in hospital room?: A Little Help needed climbing 3-5 steps with a railing? : A Lot 6 Click Score: 18    End of Session Equipment Utilized During Treatment: Gait belt Activity Tolerance: Patient tolerated treatment well Patient left: in bed;with call bell/phone within reach Nurse Communication: Mobility status PT Visit Diagnosis: Unsteadiness on feet (R26.81);Pain;Difficulty in walking, not elsewhere classified (R26.2) Pain - Right/Left: Left Pain - part of body: Knee     Time: 1319-1350 PT Time Calculation (min) (ACUTE ONLY): 31 min  Charges:  $Gait Training: 23-37 mins                    G Codes:       Mary Morrison, PT, DPT Acute Rehabilitation Services Pager: [phone]    Mary Morrison 07/04/2017, 2:03 PM

## 2017-07-04 NOTE — Evaluation (Signed)
Occupational Therapy Evaluation Patient Details Name: Mary Morrison MRN: [id number] DOB: [birth year] Today's Date: 07/04/2017    History of Present Illness Pt is a 51 y/o female who presents s/p elective L TKA on 07/03/17.    Clinical Impression   Pt is typically independent. Presents with lethargy secondary to pain meds and having not slept much last night. Pt currently requires min assist for mobility and set up to min assist for ADL. Her plan is to sponge bathe as her tub is very deep. Her mother may have a 3 in 1 she can borrow, but pt questions if it will fit in her water closet. Will follow acutely.    Follow Up Recommendations  No OT follow up    Equipment Recommendations  3 in 1 bedside commode(pt may be able to borrow one)    Recommendations for Other Services       Precautions / Restrictions Precautions Precautions: Fall;Knee Precaution Booklet Issued: Yes (comment) Precaution Comments: Reviewed exercise handout with pt. She was educated on proper positioning with NO roll/ice pack/pillow under knee, only under ankle. Required Braces or Orthoses: Knee Immobilizer - Left Restrictions Weight Bearing Restrictions: Yes LLE Weight Bearing: Weight bearing as tolerated      Mobility Bed Mobility Overal bed mobility: Needs Assistance Bed Mobility: Sit to Supine     Sit to supine: Min assist   General bed mobility comments: assist for L LE into bed  Transfers Overall transfer level: Needs assistance Equipment used: Rolling walker (2 wheeled) Transfers: Sit to/from Stand Sit to Stand: Min assist         General transfer comment: min from chair to rise and steady, cues for technique    Balance Overall balance assessment: Needs assistance Sitting-balance support: Feet supported;No upper extremity supported Sitting balance-Leahy Scale: Fair     Standing balance support: During functional activity;No upper extremity supported Standing balance-Leahy Scale:  Fair Standing balance comment: Pt able to stand at sink to wash hands and wash face/head without LOB                           ADL either performed or assessed with clinical judgement   ADL Overall ADL's : Needs assistance/impaired Eating/Feeding: Independent;Sitting   Grooming: Wash/dry hands;Sitting;Set up   Upper Body Bathing: Standing;Min guard   Lower Body Bathing: Minimal assistance;Sit to/from stand Lower Body Bathing Details (indicate cue type and reason): recommended use of her long back brush and her reacher Upper Body Dressing : Set up;Sitting   Lower Body Dressing: Minimal assistance;Sit to/from stand Lower Body Dressing Details (indicate cue type and reason): began instruction in compensatory strategies and use of AE Toilet Transfer: Minimal assistance;Ambulation;RW;BSC   Toileting- Clothing Manipulation and Hygiene: Minimal assistance;Sit to/from Nurse, children's Details (indicate cue type and reason): pt plans to sponge bathe  Functional mobility during ADLs: Minimal assistance;Rolling walker;Cueing for sequencing General ADL Comments: educated in safe footwear     Vision Baseline Vision/History: Wears glasses Wears Glasses: Reading only Patient Visual Report: No change from baseline       Perception     Praxis      Pertinent Vitals/Pain Pain Assessment: Faces Faces Pain Scale: Hurts even more Pain Location: L knee Pain Descriptors / Indicators: Operative site guarding;Aching Pain Intervention(s): Monitored during session;Premedicated before session;Repositioned;Ice applied     Hand Dominance Right   Extremity/Trunk Assessment Upper Extremity Assessment Upper Extremity Assessment: Overall WFL for tasks  assessed   Lower Extremity Assessment Lower Extremity Assessment: Defer to PT evaluation LLE Deficits / Details: Decreased strength and AROM consistent with above mentioned procedure.    Cervical / Trunk  Assessment Cervical / Trunk Assessment: Normal   Communication Communication Communication: No difficulties   Cognition Arousal/Alertness: Lethargic;Suspect due to medications Behavior During Therapy: [facility] for tasks assessed/performed Overall Cognitive Status: Within Functional Limits for tasks assessed                                     General Comments       Exercises    Shoulder Instructions      Home Living Family/patient expects to be discharged to:: Private residence Living Arrangements: Alone Available Help at Discharge: Friend(s);Available 24 hours/day(x 1 week) Type of Home: House Home Access: Stairs to enter [employer] of Steps: 2   Home Layout: Two level;Able to live on main level with bedroom/bathroom     Bathroom Shower/Tub: Tub/shower unit(deep)   Bathroom Toilet: Standard Bathroom Accessibility: (Questionable)   Home Equipment: Adaptive equipment Adaptive Equipment: Reacher(long handled bath brush) Additional Comments: 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 Will have a friend staying with her 24 hours initially. Mom has a 3 in 1 she may be able to borrow.      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 OT Goal Formulation: With patient Time For Goal Achievement: 07/11/17 Potential to Achieve Goals: Good ADL Goals Pt Will Perform Grooming: standing;with modified independence Pt Will Perform Lower Body Bathing: with modified independence;with adaptive equipment;sit to/from stand Pt  Will Perform Lower Body Dressing: with modified independence;with adaptive equipment;sit to/from stand Pt Will Transfer to Toilet: with modified independence;ambulating;bedside commode Pt Will Perform Toileting - Clothing Manipulation and hygiene: with modified independence;sit to/from stand  OT Frequency: Min 2X/week   Barriers to D/C:            Co-evaluation              AM-PAC PT "6 Clicks" Daily Activity     Outcome Measure Help from another person eating meals?: None Help from another person taking care of personal grooming?: A Little Help from another person toileting, which includes using toliet, bedpan, or urinal?: A Little Help from another person bathing (including washing, rinsing, drying)?: A Little Help from another person to put on and taking off regular upper body clothing?: None Help from another person to put on and taking off regular lower body clothing?: A Little 6 Click Score: 20   End of Session Equipment Utilized During Treatment: Rolling walker;Gait belt CPM Left Knee CPM Left Knee: Off  Activity Tolerance: Patient limited by fatigue Patient left: in bed;with call bell/phone within reach  OT Visit Diagnosis: Pain;Unsteadiness on feet (R26.81);Other abnormalities of gait and mobility (R26.89)                Time: 0272-5366 OT Time Calculation (min): 18 min Charges:  OT General Charges $OT Visit: 1  Visit OT Evaluation $OT Eval Moderate Complexity: 1 Mod G-Codes:     Malka So 07/04/2017, 10:55 AM  07/04/2017 Nestor Lewandowsky, OTR/L Pager: [phone]

## 2017-07-05 ENCOUNTER — Encounter (HOSPITAL_COMMUNITY): Payer: Self-pay | Admitting: General Practice

## 2017-07-05 ENCOUNTER — Other Ambulatory Visit: Payer: Self-pay

## 2017-07-05 MED ORDER — RIVAROXABAN 10 MG PO TABS: 10.0000 mg | ORAL_TABLET | Freq: Every day | ORAL | 0 refills | Status: DC

## 2017-07-05 MED ORDER — PNEUMOCOCCAL VAC POLYVALENT 25 MCG/0.5ML IJ INJ
0.5000 mL | INJECTION | INTRAMUSCULAR | Status: AC
  Administered 2017-07-06: 0.5 mL via INTRAMUSCULAR
  Filled 2017-07-05: qty 0.5

## 2017-07-05 MED ORDER — MORPHINE SULFATE 15 MG PO TABS: 15.0000 mg | ORAL_TABLET | ORAL | 0 refills | Status: DC | PRN

## 2017-07-05 MED ORDER — MORPHINE SULFATE 15 MG PO TABS
15.0000 mg | ORAL_TABLET | ORAL | Status: DC | PRN
  Administered 2017-07-05 – 2017-07-06 (×5): 15 mg via ORAL
  Filled 2017-07-05 (×6): qty 1

## 2017-07-05 MED ORDER — MORPHINE SULFATE (PF) 4 MG/ML IV SOLN: 4.0000 mg | INTRAVENOUS | Status: DC | PRN

## 2017-07-05 MED ORDER — DOCUSATE SODIUM 100 MG PO CAPS: 100.0000 mg | ORAL_CAPSULE | Freq: Two times a day (BID) | ORAL | 0 refills | Status: DC

## 2017-07-05 MED ORDER — METHOCARBAMOL 500 MG PO TABS: 500.0000 mg | ORAL_TABLET | Freq: Four times a day (QID) | ORAL | 0 refills | Status: DC | PRN

## 2017-07-05 NOTE — Progress Notes (Signed)
Orthopedic Tech Progress Note Patient Details:  Mary Morrison 27-Jul-1965 [id number]  Patient ID: Mary Morrison, female   DOB: 01/11/1966, 52 y.o.   MRN: [id number]   Mary Morrison 07/05/2017, 12:55 PM Placed pt's lle on cpm @0 -40 degrees @1300 ; RN notified

## 2017-07-05 NOTE — Progress Notes (Signed)
Subjective: Patient stable.  Pain reasonably well controlled but she has not done stairs yet with physical therapy   Objective: Vital signs in last 24 hours: Temp:  [98.2 F (36.8 C)-98.7 F (37.1 C)] 98.3 F (36.8 C) (12/13 0708) Pulse Rate:  [96-116] 116 (12/13 0708) Resp:  [18] 18 (12/13 0708) BP: (154-160)/(84-95) 160/93 (12/13 0708) SpO2:  [96 %-100 %] 100 % (12/13 0708)  Intake/Output from previous day: 12/12 0701 - 12/13 0700 In: 490 [P.O.:490] Out: -  Intake/Output this shift: No intake/output data recorded.  Exam:  Intact pulses distally Dorsiflexion/Plantar flexion intact  Labs: No results for input(s): HGB in the last 72 hours. No results for input(s): WBC, RBC, HCT, PLT in the last 72 hours. No results for input(s): NA, K, CL, CO2, BUN, CREATININE, GLUCOSE, CALCIUM in the last 72 hours. No results for input(s): LABPT, INR in the last 72 hours.  Assessment/Plan: Plan physical therapy this morning and this afternoon.  She may or may not be physically able to be discharged today.  We will aim for this afternoon or tomorrow morning.  Plan to change over from oxycodone which is not working to oral morphine.   G Scott  07/05/2017, 9:02 AM

## 2017-07-05 NOTE — Progress Notes (Signed)
Patient's IV occluded. RN removed IV and placed an IV team consult for new IV placement. Upon IV teams arrival patient stated that she does not want an IV and that she did not want any more morphine at the moment. Nursing will continue to monitor if patients changes mind for new IV placement.

## 2017-07-05 NOTE — Progress Notes (Signed)
Pt worked with PT, OT, pain not yet controlled, Pt also stated that she wishes to stay for one more night, MD notified.

## 2017-07-05 NOTE — Progress Notes (Signed)
RN notified MD of patients refusal of IV placement.

## 2017-07-05 NOTE — Anesthesia Postprocedure Evaluation (Signed)
Anesthesia Post Note  Patient: Mary Morrison  Procedure(s) Performed: LEFT TOTAL KNEE ARTHROPLASTY (Left )     Patient location during evaluation: PACU Anesthesia Type: Regional and Spinal Level of consciousness: oriented and awake and alert Pain management: pain level controlled Vital Signs Assessment: post-procedure vital signs reviewed and stable Respiratory status: spontaneous breathing, respiratory function stable and patient connected to nasal cannula oxygen Cardiovascular status: blood pressure returned to baseline and stable Postop Assessment: no headache, no backache and no apparent nausea or vomiting Anesthetic complications: no    Last Vitals:  Vitals:   07/04/17 2306 07/05/17 0708  BP: (!) 154/84 (!) 160/93  Pulse: (!) 115 (!) 116  Resp: 18 18  Temp: 37.1 C 36.8 C  SpO2: 96% 100%    Last Pain:  Vitals:   07/05/17 1001  TempSrc:   PainSc: 4                  ,JAMES TERRILL

## 2017-07-05 NOTE — Progress Notes (Signed)
Physical Therapy Treatment Patient Details Name: Mary Morrison MRN: [id number] DOB: 06-13-1966 Today's Date: 07/05/2017    History of Present Illness Pt is a 51 y/o female who presents s/p elective L TKA on 07/03/17.     PT Comments    Patient was able to increase gait distance this session however limited by nausea and feeling "woozy". Pt required min guard/min A for functional transfers and ambulation. Continue to progress as tolerated.    Follow Up Recommendations  DC plan and follow up therapy as arranged by surgeon;Home health PT     Equipment Recommendations  Rolling walker with 5" wheels    Recommendations for Other Services       Precautions / Restrictions Precautions Precautions: Fall;Knee Precaution Booklet Issued: Yes (comment) Precaution Comments: positioning reviewed with pt Required Braces or Orthoses: Knee Immobilizer - Left Restrictions Weight Bearing Restrictions: Yes LLE Weight Bearing: Weight bearing as tolerated    Mobility  Bed Mobility Overal bed mobility: Needs Assistance Bed Mobility: Supine to Sit     Supine to sit: Min guard     General bed mobility comments: min guard for safety  Transfers Overall transfer level: Needs assistance Equipment used: Rolling walker (2 wheeled) Transfers: Sit to/from Stand Sit to Stand: Min assist;Min guard         General transfer comment: min A from EOB to power up into standing and min guard for safety from [facility]; cues for safe hand placement  Ambulation/Gait Ambulation/Gait assistance: Min guard Ambulation Distance (Feet): (15ft total with seated rest break) Assistive device: Rolling walker (2 wheeled) Gait Pattern/deviations: Decreased dorsiflexion - left;Decreased stance time - left;Step-to pattern;Decreased step length - right;Antalgic Gait velocity: Decreased   General Gait Details: cues for posture, proximity to RW, and L heel strike; pt with c/o nausea and "wooziness"    Stairs             Wheelchair Mobility    Modified Rankin (Stroke Patients Only)       Balance Overall balance assessment: Needs assistance Sitting-balance support: Feet supported;No upper extremity supported Sitting balance-Leahy Scale: Fair     Standing balance support: During functional activity;No upper extremity supported Standing balance-Leahy Scale: Fair Standing balance comment: Pt able to stand at sink to wash hands and wash face/head without LOB                            Cognition Arousal/Alertness: Suspect due to medications;Awake/alert(drowsy ) Behavior During Therapy: WFL for tasks assessed/performed Overall Cognitive Status: Within Functional Limits for tasks assessed                                        Exercises      General Comments        Pertinent Vitals/Pain Pain Assessment: Faces Faces Pain Scale: Hurts even more Pain Location: L knee Pain Descriptors / Indicators: Aching;Guarding Pain Intervention(s): Limited activity within patient's tolerance;Monitored during session;Premedicated before session;Repositioned    Home Living                      Prior Function            PT Goals (current goals can now be found in the care plan section) Acute Rehab PT Goals Patient Stated Goal: Home at d/c - back to running her B&B PT Goal Formulation:  With patient Time For Goal Achievement: 07/18/17 Potential to Achieve Goals: Good Progress towards PT goals: Progressing toward goals    Frequency    7X/week      PT Plan Current plan remains appropriate    Co-evaluation              AM-PAC PT "6 Clicks" Daily Activity  Outcome Measure  Difficulty turning over in bed (including adjusting bedclothes, sheets and blankets)?: None Difficulty moving from lying on back to sitting on the side of the bed? : A Little Difficulty sitting down on and standing up from a chair with arms (e.g., wheelchair, bedside  commode, etc,.)?: Unable Help needed moving to and from a bed to chair (including a wheelchair)?: A Little Help needed walking in hospital room?: A Little Help needed climbing 3-5 steps with a railing? : A Lot 6 Click Score: 16    End of Session Equipment Utilized During Treatment: Gait belt Activity Tolerance: Other (comment)(limited by nausea) Patient left: with call bell/phone within reach;in chair;with family/visitor present Nurse Communication: Mobility status PT Visit Diagnosis: Unsteadiness on feet (R26.81);Pain;Difficulty in walking, not elsewhere classified (R26.2) Pain - Right/Left: Left Pain - part of body: Knee     Time: 8592-9244 PT Time Calculation (min) (ACUTE ONLY): 35 min  Charges:  $Gait Training: 8-22 mins $Therapeutic Activity: 8-22 mins                    G Codes:       Earney Navy, PTA Pager: [phone]     Darliss Cheney 07/05/2017, 3:42 PM

## 2017-07-05 NOTE — Progress Notes (Signed)
Occupational Therapy Treatment Patient Details Name: Mary Morrison MRN: [id number] DOB: July 19, 1966 Today's Date: 07/05/2017    History of present illness Pt is a 51 y/o female who presents s/p elective L TKA on 07/03/17.    OT comments  Pt with nausea and feeling "woozy" following PT session. Limited grooming while seated in chair. Demonstrated use of AE for LB bathing and dressing. Pt progressing slowly.  Follow Up Recommendations  No OT follow up    Equipment Recommendations  None recommended by OT    Recommendations for Other Services      Precautions / Restrictions Precautions Precautions: Fall;Knee Precaution Booklet Issued: Yes (comment) Precaution Comments: positioning reviewed with pt Required Braces or Orthoses: Knee Immobilizer - Left Restrictions Weight Bearing Restrictions: Yes LLE Weight Bearing: Weight bearing as tolerated       Mobility Bed Mobility Transfers Overall transfer level: Needs assistance Equipment used: Rolling walker (2 wheeled) Transfers: Sit to/from Stand Sit to Stand: Min guard         General transfer comment: from chair, increased time    Balance Overall balance assessment: Needs assistance Sitting-balance support: Feet supported;No upper extremity supported Sitting balance-Leahy Scale: Fair     Standing balance support: During functional activity;No upper extremity supported Standing balance-Leahy Scale: Fair Standing balance comment: can release walker in static standing                           ADL either performed or assessed with clinical judgement   ADL Overall ADL's : Needs assistance/impaired     Grooming: Oral care;Sitting;Min guard               Lower Body Dressing: Minimal assistance;Sit to/from stand Lower Body Dressing Details (indicate cue type and reason): educated pt/showed her AE, mother has some which pt may borrow Toilet Transfer: Min guard;Ambulation;RW;BSC   Toileting-  Water quality scientist and Hygiene: Min guard;Sit to/from stand         General ADL Comments: pt limited by nausea     Vision       Perception     Praxis      Cognition Arousal/Alertness: Suspect due to medications;Awake/alert(drowsy) Behavior During Therapy: WFL for tasks assessed/performed Overall Cognitive Status: Within Functional Limits for tasks assessed                                          Exercises     Shoulder Instructions       General Comments      Pertinent Vitals/ Pain       Pain Assessment: Faces Faces Pain Scale: Hurts even more Pain Location: L knee Pain Descriptors / Indicators: Aching;Guarding Pain Intervention(s): Monitored during session;Repositioned;Ice applied  Home Living                                          Prior Functioning/Environment              Frequency  Min 2X/week        Progress Toward Goals  OT Goals(current goals can now be found in the care plan section)  Progress towards OT goals: Not progressing toward goals - comment(pt drowsy with nausea, likely due to meds)  Acute Rehab OT Goals Patient Stated  Goal: walk her dog OT Goal Formulation: With patient Time For Goal Achievement: 07/11/17 Potential to Achieve Goals: Good  Plan Discharge plan remains appropriate    Co-evaluation                 AM-PAC PT "6 Clicks" Daily Activity     Outcome Measure   Help from another person eating meals?: None Help from another person taking care of personal grooming?: A Little Help from another person toileting, which includes using toliet, bedpan, or urinal?: A Little Help from another person bathing (including washing, rinsing, drying)?: A Little Help from another person to put on and taking off regular upper body clothing?: None Help from another person to put on and taking off regular lower body clothing?: A Little 6 Click Score: 20    End of Session Equipment  Utilized During Treatment: Rolling walker;Gait belt  OT Visit Diagnosis: Pain;Unsteadiness on feet (R26.81);Other abnormalities of gait and mobility (R26.89)   Activity Tolerance Treatment limited secondary to medical complications (Comment)(nausea)   Patient Left in chair;with call bell/phone within reach;with family/visitor present;with bed alarm set   Nurse Communication          Time: 9574-7340 OT Time Calculation (min): 22 min  Charges: OT General Charges $OT Visit: 1 Visit OT Treatments $Self Care/Home Management : 8-22 mins  07/05/2017 Nestor Lewandowsky, OTR/L Pager: [phone] Werner Lean Haze Boyden 07/05/2017, 3:56 PM

## 2017-07-05 NOTE — Plan of Care (Signed)
  Clinical Measurements: Will remain free from infection 07/05/2017 1127 - Progressing by Williams Che, RN   Pain Managment: General experience of comfort will improve 07/05/2017 1127 - Progressing by Williams Che, RN   Safety: Ability to remain free from injury will improve 07/05/2017 1127 - Progressing by Williams Che, RN

## 2017-07-06 NOTE — Progress Notes (Signed)
Discharge teaching complete. Meds, diet, activity, follow up appointments, and incision care reviewed and all questions answered. Copy of instructions and Morphine prescription given to patient.

## 2017-07-06 NOTE — Progress Notes (Signed)
Physical Therapy Treatment Patient Details Name: Mary Morrison MRN: [id number] DOB: 28-Dec-1965 Today's Date: 07/06/2017    History of Present Illness Pt is a 51 y/o female who presents s/p elective L TKA on 07/03/17.     PT Comments    Patient is making progress toward mobility goals and reported feeling much better this am. Pt was able to safely ascend/descend 2 steps simulating home entrance. HEP next session.    Follow Up Recommendations  DC plan and follow up therapy as arranged by surgeon;Home health PT     Equipment Recommendations  Rolling walker with 5" wheels    Recommendations for Other Services       Precautions / Restrictions Precautions Precautions: Fall;Knee Precaution Booklet Issued: Yes (comment) Precaution Comments: positioning reviewed with pt Required Braces or Orthoses: Knee Immobilizer - Left Restrictions Weight Bearing Restrictions: Yes LLE Weight Bearing: Weight bearing as tolerated    Mobility  Bed Mobility Overal bed mobility: Modified Independent Bed Mobility: Sit to Supine           General bed mobility comments: increased time and effort needed  Transfers Overall transfer level: Needs assistance Equipment used: Rolling walker (2 wheeled) Transfers: Sit to/from Stand Sit to Stand: Min guard         General transfer comment: min guard for safety  Ambulation/Gait Ambulation/Gait assistance: Min guard Ambulation Distance (Feet): 120 Feet Assistive device: Rolling walker (2 wheeled) Gait Pattern/deviations: Decreased dorsiflexion - left;Decreased stance time - left;Step-to pattern;Decreased step length - right;Antalgic Gait velocity: Decreased   General Gait Details: heavy reliance on UE support; cues for posture and sequencing   Stairs Stairs: Yes   Stair Management: No rails;Step to pattern;Backwards;With walker Number of Stairs: 2 General stair comments: cues for sequencing and technique; assist to stabilize  RW  Wheelchair Mobility    Modified Rankin (Stroke Patients Only)       Balance Overall balance assessment: Needs assistance Sitting-balance support: Feet supported;No upper extremity supported Sitting balance-Leahy Scale: Fair     Standing balance support: During functional activity;Bilateral upper extremity supported Standing balance-Leahy Scale: Poor                              Cognition Arousal/Alertness: Awake/alert Behavior During Therapy: WFL for tasks assessed/performed Overall Cognitive Status: Within Functional Limits for tasks assessed                                        Exercises      General Comments        Pertinent Vitals/Pain Pain Assessment: Faces Faces Pain Scale: Hurts little more Pain Location: L knee Pain Descriptors / Indicators: Aching;Guarding;Sharp Pain Intervention(s): Limited activity within patient's tolerance;Monitored during session;Premedicated before session;Repositioned    Home Living                      Prior Function            PT Goals (current goals can now be found in the care plan section) Acute Rehab PT Goals Patient Stated Goal: Home at d/c - back to running her B&B PT Goal Formulation: With patient Time For Goal Achievement: 07/18/17 Potential to Achieve Goals: Good Progress towards PT goals: Progressing toward goals    Frequency    7X/week      PT Plan Current plan  remains appropriate    Co-evaluation              AM-PAC PT "6 Clicks" Daily Activity  Outcome Measure  Difficulty turning over in bed (including adjusting bedclothes, sheets and blankets)?: None Difficulty moving from lying on back to sitting on the side of the bed? : A Little Difficulty sitting down on and standing up from a chair with arms (e.g., wheelchair, bedside commode, etc,.)?: Unable Help needed moving to and from a bed to chair (including a wheelchair)?: A Little Help needed walking  in hospital room?: A Little Help needed climbing 3-5 steps with a railing? : A Little 6 Click Score: 17    End of Session Equipment Utilized During Treatment: Gait belt Activity Tolerance: Patient tolerated treatment well Patient left: with call bell/phone within reach;in bed;in CPM Nurse Communication: Mobility status PT Visit Diagnosis: Unsteadiness on feet (R26.81);Pain;Difficulty in walking, not elsewhere classified (R26.2) Pain - Right/Left: Left Pain - part of body: Knee     Time: 1761-6073 PT Time Calculation (min) (ACUTE ONLY): 26 min  Charges:  $Gait Training: 23-37 mins                    G Codes:       Earney Navy, PTA Pager: [phone]     Darliss Cheney 07/06/2017, 9:09 AM

## 2017-07-06 NOTE — Progress Notes (Signed)
   07/06/17 1400  OT Visit Information  Last OT Received On 07/06/17  Assistance Needed +1  History of Present Illness Pt is a 51 y/o female who presents s/p elective L TKA on 07/03/17.   Precautions  Precautions Fall;Knee  Pain Assessment  Pain Assessment 0-10  Pain Score 5  Pain Location L knee  Pain Descriptors / Indicators Sore  Pain Intervention(s) Monitored during session;Premedicated before session;Ice applied  Cognition  Behavior During Therapy WFL for tasks assessed/performed  Overall Cognitive Status Within Functional Limits for tasks assessed  ADL  Overall ADL's  Needs assistance/impaired  Grooming Supervision/safety;Standing;Wash/dry hands  Lower Body Bathing Supervison/ safety  Lower Body Bathing Details (indicate cue type and reason) pt able to reach her feet with AE  Lower Body Dressing Supervision/safety;Sit to/from stand  Lower Body Dressing Details (indicate cue type and reason) no need for AE, pt reaching feet   Toilet Transfer Supervision/safety;RW;Ambulation;BSC  Toileting- Clothing Manipulation and Hygiene Supervision/safety;Sit to/from stand  Functional mobility during ADLs Supervision/safety;Rolling walker  General ADL Comments plans to put 3 in 1 sideways in tub to shower per mother.  Bed Mobility  Overal bed mobility Modified Independent  Balance  Sitting balance-Leahy Scale Good  Standing balance-Leahy Scale Fair  Restrictions  Weight Bearing Restrictions Yes  LLE Weight Bearing WBAT  Transfers  Overall transfer level Needs assistance  Equipment used Rolling walker (2 wheeled)  Transfers Sit to/from Stand  Sit to Stand Supervision  General transfer comment supervision for safety  OT - End of Session  Equipment Utilized During Treatment Rolling walker;Gait belt  Activity Tolerance Patient tolerated treatment well  Patient left in bed;with call bell/phone within reach;with family/visitor present  OT Assessment/Plan  OT Plan Discharge plan remains  appropriate  OT Visit Diagnosis Pain;Unsteadiness on feet (R26.81);Other abnormalities of gait and mobility (R26.89)  OT Frequency (ACUTE ONLY) Min 2X/week  Follow Up Recommendations No OT follow up  OT Equipment None recommended by OT  AM-PAC OT "6 Clicks" Daily Activity Outcome Measure  Help from another person eating meals? 4  Help from another person taking care of personal grooming? 3  Help from another person toileting, which includes using toliet, bedpan, or urinal? 3  Help from another person bathing (including washing, rinsing, drying)? 3  Help from another person to put on and taking off regular upper body clothing? 4  Help from another person to put on and taking off regular lower body clothing? 3  6 Click Score 20  ADL G Code Conversion CJ  OT Goal Progression  Progress towards OT goals Progressing toward goals  Acute Rehab OT Goals  Patient Stated Goal Home at d/c - back to running her B&B  OT Goal Formulation With patient  Time For Goal Achievement 07/11/17  Potential to Achieve Goals Good  OT Time Calculation  OT Start Time (ACUTE ONLY) 1415  OT Stop Time (ACUTE ONLY) 1429  OT Time Calculation (min) 14 min  OT General Charges  $OT Visit 1 Visit  OT Treatments  $Self Care/Home Management  8-22 mins  07/06/2017 Nestor Lewandowsky, OTR/L Pager: [phone]

## 2017-07-06 NOTE — Progress Notes (Signed)
Subjective: Patient stable.  Pain better today.   Objective: Vital signs in last 24 hours: Temp:  [98.6 F (37 C)-98.9 F (37.2 C)] 98.6 F (37 C) (12/14 0700) Pulse Rate:  [105-107] 105 (12/14 0700) Resp:  [17] 17 (12/13 2236) BP: (124-140)/(67-74) 140/74 (12/14 0700) SpO2:  [100 %] 100 % (12/14 0700)  Intake/Output from previous day: 12/13 0701 - 12/14 0700 In: 720 [P.O.:720] Out: -  Intake/Output this shift: No intake/output data recorded.  Exam:  Neurovascular intact Sensation intact distally  Labs: No results for input(s): HGB in the last 72 hours. No results for input(s): WBC, RBC, HCT, PLT in the last 72 hours. No results for input(s): NA, K, CL, CO2, BUN, CREATININE, GLUCOSE, CALCIUM in the last 72 hours. No results for input(s): LABPT, INR in the last 72 hours.  Assessment/Plan: Plan discharge today.  Prescription on chart.   Landry Dyke Dean 07/06/2017, 12:10 PM

## 2017-07-06 NOTE — Progress Notes (Signed)
Patient discharged via wheelchair to home with mother.

## 2017-07-06 NOTE — Progress Notes (Signed)
Physical Therapy Treatment Patient Details Name: Mary Morrison MRN: [id number] DOB: 10/23/1965 Today's Date: 07/06/2017    History of Present Illness Pt is a 51 y/o female who presents s/p elective L TKA on 07/03/17.     PT Comments    Patient was led through HEP and handout, positioning, activity progression, and use of ice reviewed. Continue to progress as tolerated.    Follow Up Recommendations  DC plan and follow up therapy as arranged by surgeon;Home health PT     Equipment Recommendations  Rolling walker with 5" wheels    Recommendations for Other Services       Precautions / Restrictions Precautions Precautions: Fall;Knee Restrictions Weight Bearing Restrictions: Yes LLE Weight Bearing: Weight bearing as tolerated    Mobility  Bed Mobility Overal bed mobility: Modified Independent                Transfers Overall transfer level: Needs assistance Equipment used: Rolling walker (2 wheeled) Transfers: Sit to/from Stand Sit to Stand: Supervision         General transfer comment: supervision for safety  Ambulation/Gait                 Stairs            Wheelchair Mobility    Modified Rankin (Stroke Patients Only)       Balance     Sitting balance-Leahy Scale: Good       Standing balance-Leahy Scale: Poor                              Cognition Arousal/Alertness: Awake/alert Behavior During Therapy: WFL for tasks assessed/performed Overall Cognitive Status: Within Functional Limits for tasks assessed                                        Exercises Total Joint Exercises Ankle Circles/Pumps: AROM;10 reps;Both Quad Sets: AROM;Left;10 reps Short Arc QuadSinclair Ship;Left;5 reps Heel Slides: AAROM;Left;10 reps Hip ABduction/ADduction: AROM;Left;10 reps Straight Leg Raises: AAROM;Left;5 reps    General Comments        Pertinent Vitals/Pain Pain Assessment: 0-10 Pain Score: 9  Pain  Location: L knee Pain Descriptors / Indicators: Guarding;Grimacing;Moaning;Sore Pain Intervention(s): Limited activity within patient's tolerance;Monitored during session;Repositioned;Premedicated before session;Patient requesting pain meds-RN notified    Home Living                      Prior Function            PT Goals (current goals can now be found in the care plan section) Acute Rehab PT Goals Patient Stated Goal: Home at d/c - back to running her B&B PT Goal Formulation: With patient Time For Goal Achievement: 07/18/17 Potential to Achieve Goals: Good Progress towards PT goals: Progressing toward goals    Frequency    7X/week      PT Plan Current plan remains appropriate    Co-evaluation              AM-PAC PT "6 Clicks" Daily Activity  Outcome Measure  Difficulty turning over in bed (including adjusting bedclothes, sheets and blankets)?: None Difficulty moving from lying on back to sitting on the side of the bed? : A Little Difficulty sitting down on and standing up from a chair with arms (e.g., wheelchair, bedside commode, etc,.)?: Unable  Help needed moving to and from a bed to chair (including a wheelchair)?: A Little Help needed walking in hospital room?: A Little Help needed climbing 3-5 steps with a railing? : A Little 6 Click Score: 17    End of Session Equipment Utilized During Treatment: Gait belt Activity Tolerance: Patient limited by pain Patient left: with call bell/phone within reach;in bed;with nursing/sitter in room;with family/visitor present Nurse Communication: Mobility status PT Visit Diagnosis: Unsteadiness on feet (R26.81);Pain;Difficulty in walking, not elsewhere classified (R26.2) Pain - Right/Left: Left Pain - part of body: Knee     Time: 3474-2595 PT Time Calculation (min) (ACUTE ONLY): 27 min  Charges:  $Therapeutic Exercise: 23-37 mins                    G Codes:       Earney Navy, PTA Pager: [phone]     Darliss Cheney 07/06/2017, 3:04 PM

## 2017-07-07 DIAGNOSIS — M1712 Unilateral primary osteoarthritis, left knee: Secondary | ICD-10-CM | POA: Diagnosis not present

## 2017-07-07 DIAGNOSIS — Z96652 Presence of left artificial knee joint: Secondary | ICD-10-CM | POA: Diagnosis not present

## 2017-07-09 ENCOUNTER — Telehealth (INDEPENDENT_AMBULATORY_CARE_PROVIDER_SITE_OTHER): Payer: Self-pay | Admitting: Radiology

## 2017-07-09 NOTE — Telephone Encounter (Signed)
[facility] will be going to see patient tomorrow. FYI only.

## 2017-07-10 ENCOUNTER — Telehealth (INDEPENDENT_AMBULATORY_CARE_PROVIDER_SITE_OTHER): Payer: Self-pay

## 2017-07-10 DIAGNOSIS — I8390 Asymptomatic varicose veins of unspecified lower extremity: Secondary | ICD-10-CM | POA: Diagnosis not present

## 2017-07-10 DIAGNOSIS — Z471 Aftercare following joint replacement surgery: Secondary | ICD-10-CM | POA: Diagnosis not present

## 2017-07-10 NOTE — Telephone Encounter (Signed)
Worked patient in. Advised likely will have to wait due to the schedule being full.

## 2017-07-10 NOTE — Telephone Encounter (Signed)
Patient left message on triage VM stating she voiced concern to her HHPT about a "spot" under her bandage growing in size and looks green. She is afraid of infection and is demanding to be seen asap

## 2017-07-11 ENCOUNTER — Ambulatory Visit (INDEPENDENT_AMBULATORY_CARE_PROVIDER_SITE_OTHER): Payer: 59

## 2017-07-11 ENCOUNTER — Ambulatory Visit (INDEPENDENT_AMBULATORY_CARE_PROVIDER_SITE_OTHER): Payer: 59 | Admitting: Orthopedic Surgery

## 2017-07-11 ENCOUNTER — Encounter (INDEPENDENT_AMBULATORY_CARE_PROVIDER_SITE_OTHER): Payer: Self-pay | Admitting: Orthopedic Surgery

## 2017-07-11 DIAGNOSIS — Z96652 Presence of left artificial knee joint: Secondary | ICD-10-CM

## 2017-07-11 MED ORDER — TRAMADOL HCL 50 MG PO TABS: 50.0000 mg | ORAL_TABLET | Freq: Three times a day (TID) | ORAL | 0 refills | Status: DC

## 2017-07-12 ENCOUNTER — Telehealth (INDEPENDENT_AMBULATORY_CARE_PROVIDER_SITE_OTHER): Payer: Self-pay | Admitting: Radiology

## 2017-07-12 DIAGNOSIS — I8390 Asymptomatic varicose veins of unspecified lower extremity: Secondary | ICD-10-CM | POA: Diagnosis not present

## 2017-07-12 DIAGNOSIS — Z471 Aftercare following joint replacement surgery: Secondary | ICD-10-CM | POA: Diagnosis not present

## 2017-07-12 NOTE — Telephone Encounter (Signed)
Vicente Males called and asked to extend the HHPT as patient is concerned with getting to physical therapy appts.  Does not have transportation and is not comfortable to drive.  Visits approved for 3w x1, 2w x1, 3w x2, and 2w x2.

## 2017-07-12 NOTE — Telephone Encounter (Signed)
Ok thx.

## 2017-07-14 DIAGNOSIS — Z471 Aftercare following joint replacement surgery: Secondary | ICD-10-CM | POA: Diagnosis not present

## 2017-07-14 DIAGNOSIS — I8390 Asymptomatic varicose veins of unspecified lower extremity: Secondary | ICD-10-CM | POA: Diagnosis not present

## 2017-07-14 NOTE — Progress Notes (Signed)
Post-Op Visit Note   Patient: Mary Morrison           Date of Birth: [birth year]           MRN: [id number] Visit Date: 07/11/2017 PCP: Harlan Stains, MD   Assessment & Plan:  Chief Complaint:  Chief Complaint  Patient presents with  . Left Knee - Routine Post Op   Visit Diagnoses:  1. History of left knee replacement     Plan: Patient is a week out left total knee replacement.  On examination the incision is intact.  She has flexion to 97 degrees of full extension.  Radiographs look good.  Changing over to Ultram for pain.  Waterproof dressing applied.  Follow-up in January.  Continue with CPM and home health physical therapy  Follow-Up Instructions: No Follow-up on file.   Orders:  Orders Placed This Encounter  Procedures  . XR Knee 1-2 Views Left   Meds ordered this encounter  Medications  . traMADol (ULTRAM) 50 MG tablet    Sig: Take 1 tablet (50 mg total) by mouth 3 (three) times daily.    Dispense:  60 tablet    Refill:  0    Imaging: No results found.  PMFS History: Patient Active Problem List   Diagnosis Date Noted  . Arthritis of knee 07/03/2017  . Varicose veins of bilateral lower extremities with other complications 51/76/1607  . Acute pain of left knee 01/25/2017  . Swelling of left knee joint 01/25/2017  . S/P hysterectomy 09/21/2014  . Lapband APS July 2015 02/17/2014  . Edema-chronic pitting in legs 01/20/2014  . Morbid obesity ([place]) 08/28/2013   Past Medical History:  Diagnosis Date  . Arthritis   . Asthma    EXERCISED INDUCED 2014   . Depression   . Fibroids   . Obesity   . Seasonal allergies   . Varicose vein of leg     Family History  Problem Relation Age of Onset  . Cancer Mother        breast/bilateral mastectomies  . Diabetes Other   . Hypertension Other   . Stroke Other   . Obesity Other     Past Surgical History:  Procedure Laterality Date  . BILATERAL SALPINGECTOMY Bilateral 09/21/2014   Procedure: BILATERAL  SALPINGECTOMY;  Surgeon: Marvene Staff, MD;  Location: [facility];  Service: Gynecology;  Laterality: Bilateral;  . CESAREAN SECTION  1989  . COLONOSCOPY    . LAPAROSCOPIC GASTRIC BANDING N/A 02/17/2014   Procedure: LAPAROSCOPIC GASTRIC BANDING;  Surgeon: Pedro Earls, MD;  Location: [facility];  Service: General;  Laterality: N/A;  . ROBOT ASSISTED MYOMECTOMY N/A 09/21/2014   Procedure: ROBOTIC ASSISTED MYOMECTOMY;  Surgeon: Marvene Staff, MD;  Location: [facility];  Service: Gynecology;  Laterality: N/A;  . ROBOTIC ASSISTED TOTAL HYSTERECTOMY N/A 09/21/2014   Procedure: ROBOTIC ASSISTED TOTAL HYSTERECTOMY, EXCISION OF PELVIC ENDOMETRIOSIS;  Surgeon: Marvene Staff, MD;  Location: [facility];  Service: Gynecology;  Laterality: N/A;  . TONSILLECTOMY    . TOTAL KNEE ARTHROPLASTY Left 07/03/2017   Procedure: LEFT TOTAL KNEE ARTHROPLASTY;  Surgeon: Meredith Pel, MD;  Location: [facility];  Service: Orthopedics;  Laterality: Left;   Social History   Occupational History  . Not on file  Tobacco Use  . Smoking status: Never Smoker  . Smokeless tobacco: Never Used  Substance and Sexual Activity  . Alcohol use: No  . Drug use: No  . Sexual activity: Not Currently  Birth control/protection: None

## 2017-07-19 DIAGNOSIS — I8390 Asymptomatic varicose veins of unspecified lower extremity: Secondary | ICD-10-CM | POA: Diagnosis not present

## 2017-07-19 DIAGNOSIS — Z471 Aftercare following joint replacement surgery: Secondary | ICD-10-CM | POA: Diagnosis not present

## 2017-07-21 DIAGNOSIS — Z471 Aftercare following joint replacement surgery: Secondary | ICD-10-CM | POA: Diagnosis not present

## 2017-07-21 DIAGNOSIS — I8390 Asymptomatic varicose veins of unspecified lower extremity: Secondary | ICD-10-CM | POA: Diagnosis not present

## 2017-07-25 DIAGNOSIS — I8390 Asymptomatic varicose veins of unspecified lower extremity: Secondary | ICD-10-CM | POA: Diagnosis not present

## 2017-07-25 DIAGNOSIS — Z471 Aftercare following joint replacement surgery: Secondary | ICD-10-CM | POA: Diagnosis not present

## 2017-07-27 ENCOUNTER — Telehealth (INDEPENDENT_AMBULATORY_CARE_PROVIDER_SITE_OTHER): Payer: Self-pay | Admitting: Orthopedic Surgery

## 2017-07-27 DIAGNOSIS — Z471 Aftercare following joint replacement surgery: Secondary | ICD-10-CM | POA: Diagnosis not present

## 2017-07-27 DIAGNOSIS — I8390 Asymptomatic varicose veins of unspecified lower extremity: Secondary | ICD-10-CM | POA: Diagnosis not present

## 2017-07-27 NOTE — Telephone Encounter (Signed)
Patient called wanting to know if she was okay to get a pedicure because she is finish with her blood thinners.  She is also wanting to know if she can take an over the counter anti-inflammatory.  CB#[phone].  Thank you.

## 2017-07-27 NOTE — Telephone Encounter (Signed)
Can you advise? She just had TKA about 1 month ago.

## 2017-07-27 NOTE — Telephone Encounter (Signed)
IC s/w patient and advised  

## 2017-07-28 DIAGNOSIS — Z471 Aftercare following joint replacement surgery: Secondary | ICD-10-CM | POA: Diagnosis not present

## 2017-07-28 DIAGNOSIS — I8390 Asymptomatic varicose veins of unspecified lower extremity: Secondary | ICD-10-CM | POA: Diagnosis not present

## 2017-07-30 DIAGNOSIS — I8390 Asymptomatic varicose veins of unspecified lower extremity: Secondary | ICD-10-CM | POA: Diagnosis not present

## 2017-07-30 DIAGNOSIS — Z471 Aftercare following joint replacement surgery: Secondary | ICD-10-CM | POA: Diagnosis not present

## 2017-07-30 NOTE — Discharge Summary (Signed)
Physician Discharge Summary  Patient ID: Mary Morrison MRN: [id number] DOB/AGE: 1966/01/15 52 y.o.  Admit date: 07/03/2017 Discharge date: 07/06/2017  Admission Diagnoses:  Active Problems:   Arthritis of knee   Discharge Diagnoses:  Same  Surgeries: Procedure(s): LEFT TOTAL KNEE ARTHROPLASTY on 07/03/2017   Consultants:   Discharged Condition: Stable  Hospital Course: Mary Morrison is an 52 y.o. female who was admitted 07/03/2017 with a chief complaint of left knee pain, and found to have a diagnosis of left knee arthritis.  They were brought to the operating room on 07/03/2017 and underwent the above named procedures.  Patient tolerated the procedure well and was transferred to recovery room in stable condition.  Motor sensory function in the left leg intact.  Patient started on physical therapy and CPM for range of motion and ambulation.  Patient made somewhat slow recovery but was ambulating in the hall by the time of discharge.  She is discharged to home in good condition with home health physical therapy.  She will follow-up with me in 10 days for clinical recheck  Antibiotics given:  Anti-infectives (From admission, onward)   Start     Dose/Rate Route Frequency Ordered Stop   07/03/17 1800  ceFAZolin (ANCEF) IVPB 2g/100 mL premix     2 g 200 mL/hr over 30 Minutes Intravenous Every 6 hours 07/03/17 1754 07/04/17 0632   07/03/17 1132  ceFAZolin (ANCEF) 2-4 GM/100ML-% IVPB    Comments:  Starleen Arms   : cabinet override      07/03/17 1132 07/03/17 1145   07/03/17 1045  ceFAZolin (ANCEF) IVPB 2g/100 mL premix     2 g 200 mL/hr over 30 Minutes Intravenous On call to O.R. 07/03/17 1042 07/03/17 1155    .  Recent vital signs:  Vitals:   07/05/17 2236 07/06/17 0700  BP: 124/67 140/74  Pulse: (!) 107 (!) 105  Resp: 17   Temp: 98.9 F (37.2 C) 98.6 F (37 C)  SpO2: 100% 100%    Recent laboratory studies:  Results for orders placed or performed during the  hospital encounter of 06/27/17  Surgical pcr screen  Result Value Ref Range   MRSA, PCR NEGATIVE NEGATIVE   Staphylococcus aureus NEGATIVE NEGATIVE  CBC  Result Value Ref Range   WBC 6.9 4.0 - 10.5 K/uL   RBC 4.12 3.87 - 5.11 MIL/uL   Hemoglobin 13.8 12.0 - 15.0 g/dL   HCT 42.7 36.0 - 46.0 %   MCV 103.6 (H) 78.0 - 100.0 fL   MCH 33.5 26.0 - 34.0 pg   MCHC 32.3 30.0 - 36.0 g/dL   RDW 13.3 11.5 - 15.5 %   Platelets 251 150 - 400 K/uL  Basic metabolic panel  Result Value Ref Range   Sodium 136 135 - 145 mmol/L   Potassium 3.5 3.5 - 5.1 mmol/L   Chloride 100 (L) 101 - 111 mmol/L   CO2 26 22 - 32 mmol/L   Glucose, Bld 106 (H) 65 - 99 mg/dL   BUN 11 6 - 20 mg/dL   Creatinine, Ser 0.93 0.44 - 1.00 mg/dL   Calcium 9.0 8.9 - 10.3 mg/dL   GFR calc non Af Amer >60 >60 mL/min   GFR calc Af Amer >60 >60 mL/min   Anion gap 10 5 - 15    Discharge Medications:   Allergies as of 07/06/2017   No Known Allergies     Medication List    STOP taking these medications   ibuprofen 100  MG/5ML suspension Commonly known as:  CHILDRENS MOTRIN     TAKE these medications   buPROPion 300 MG 24 hr tablet Commonly known as:  WELLBUTRIN XL Take 300 mg by mouth daily.   docusate sodium 100 MG capsule Commonly known as:  COLACE Take 1 capsule (100 mg total) by mouth 2 (two) times daily.   methocarbamol 500 MG tablet Commonly known as:  ROBAXIN Take 1 tablet (500 mg total) by mouth every 6 (six) hours as needed for muscle spasms.   morphine 15 MG tablet Commonly known as:  MSIR Take 1 tablet (15 mg total) by mouth every 4 (four) hours as needed for severe pain.   PROBIOTIC PO Take 1 capsule by mouth daily.   rivaroxaban 10 MG Tabs tablet Commonly known as:  XARELTO Take 1 tablet (10 mg total) by mouth daily with breakfast.   traZODone 50 MG tablet Commonly known as:  DESYREL Take 100 mg by mouth at bedtime.   triamterene-hydrochlorothiazide 37.5-25 MG tablet Commonly known as:   MAXZIDE-25 TAKE 1 TABLET BY MOUTH ONCE A DAY IN THE MORNING       Diagnostic Studies: Xr Knee 1-2 Views Left  Result Date: 07/14/2017 2 views left knee reviewed.  Total knee prosthesis in good position and alignment with no complicating features.   Disposition: 06-Home-Health Care Svc  Discharge Instructions    Call MD / Call 911   Complete by:  As directed    If you experience chest pain or shortness of breath, CALL 911 and be transported to the hospital emergency room.  If you develope a fever above 101 F, pus (white drainage) or increased drainage or redness at the wound, or calf pain, call your surgeon's office.   Call MD / Call 911   Complete by:  As directed    If you experience chest pain or shortness of breath, CALL 911 and be transported to the hospital emergency room.  If you develope a fever above 101 F, pus (white drainage) or increased drainage or redness at the wound, or calf pain, call your surgeon's office.   Constipation Prevention   Complete by:  As directed    Drink plenty of fluids.  Prune juice may be helpful.  You may use a stool softener, such as Colace (over the counter) 100 mg twice a day.  Use MiraLax (over the counter) for constipation as needed.   Constipation Prevention   Complete by:  As directed    Drink plenty of fluids.  Prune juice may be helpful.  You may use a stool softener, such as Colace (over the counter) 100 mg twice a day.  Use MiraLax (over the counter) for constipation as needed.   Diet - low sodium heart healthy   Complete by:  As directed    Diet - low sodium heart healthy   Complete by:  As directed    Discharge instructions   Complete by:  As directed    Weightbearing as tolerated with crutches CPM machine 1 hour 3 times a day Okay to shower dressing is waterproof Remove dressing in 1 week and rewrap knee with Ace wrap   Increase activity slowly as tolerated   Complete by:  As directed    Increase activity slowly as tolerated    Complete by:  As directed       Follow-up Information    [facility] up.   Specialty:  [place] Why:  A representative from [facility] will contact you to arrange start date and time for your therapy.  Contact information: [address] [place] [address] [phone]            Signed: Anderson Malta 07/30/2017, 10:58 AM

## 2017-08-01 DIAGNOSIS — Z471 Aftercare following joint replacement surgery: Secondary | ICD-10-CM | POA: Diagnosis not present

## 2017-08-01 DIAGNOSIS — I8390 Asymptomatic varicose veins of unspecified lower extremity: Secondary | ICD-10-CM | POA: Diagnosis not present

## 2017-08-03 DIAGNOSIS — I8390 Asymptomatic varicose veins of unspecified lower extremity: Secondary | ICD-10-CM | POA: Diagnosis not present

## 2017-08-03 DIAGNOSIS — Z471 Aftercare following joint replacement surgery: Secondary | ICD-10-CM | POA: Diagnosis not present

## 2017-08-08 DIAGNOSIS — I8390 Asymptomatic varicose veins of unspecified lower extremity: Secondary | ICD-10-CM | POA: Diagnosis not present

## 2017-08-08 DIAGNOSIS — Z471 Aftercare following joint replacement surgery: Secondary | ICD-10-CM | POA: Diagnosis not present

## 2017-08-09 ENCOUNTER — Ambulatory Visit (INDEPENDENT_AMBULATORY_CARE_PROVIDER_SITE_OTHER): Payer: 59 | Admitting: Orthopedic Surgery

## 2017-08-09 ENCOUNTER — Encounter (INDEPENDENT_AMBULATORY_CARE_PROVIDER_SITE_OTHER): Payer: Self-pay | Admitting: Orthopedic Surgery

## 2017-08-09 DIAGNOSIS — Z96652 Presence of left artificial knee joint: Secondary | ICD-10-CM

## 2017-08-11 ENCOUNTER — Encounter (INDEPENDENT_AMBULATORY_CARE_PROVIDER_SITE_OTHER): Payer: Self-pay | Admitting: Orthopedic Surgery

## 2017-08-11 DIAGNOSIS — I8390 Asymptomatic varicose veins of unspecified lower extremity: Secondary | ICD-10-CM | POA: Diagnosis not present

## 2017-08-11 DIAGNOSIS — Z471 Aftercare following joint replacement surgery: Secondary | ICD-10-CM | POA: Diagnosis not present

## 2017-08-11 NOTE — Progress Notes (Signed)
Post-Op Visit Note   Patient: Mary Morrison           Date of Birth: October 17, 1965           MRN: [id number] Visit Date: 08/09/2017 PCP: Harlan Stains, MD   Assessment & Plan:  Chief Complaint:  Chief Complaint  Patient presents with  . Left Knee - Follow-up, Routine Post Op   Visit Diagnoses:  1. S/P total knee arthroplasty, left     Plan: The patient is now she is in physical therapy 2 times a week.  Therapy note is reviewed.  She has range of motion from 5-108.  She is taking anti-inflammatories and not pain medicine.  On examination the incision is intact and there is no knee effusion.  Gait is normalizing.  Plan is to progress to outpatient physical therapy and 6-week return.  Will take her several months to get over the pain portion of this procedure.  I do want her to continue working on range of motion.  Follow-Up Instructions: Return in about 6 weeks (around 09/20/2017).   Orders:  Orders Placed This Encounter  Procedures  . Ambulatory referral to Physical Therapy   No orders of the defined types were placed in this encounter.   Imaging: No results found.  PMFS History: Patient Active Problem List   Diagnosis Date Noted  . Arthritis of knee 07/03/2017  . Varicose veins of bilateral lower extremities with other complications 34/19/3790  . Acute pain of left knee 01/25/2017  . Swelling of left knee joint 01/25/2017  . S/P hysterectomy 09/21/2014  . Lapband APS July 2015 02/17/2014  . Edema-chronic pitting in legs 01/20/2014  . Morbid obesity ([place]) 08/28/2013   Past Medical History:  Diagnosis Date  . Arthritis   . Asthma    EXERCISED INDUCED 2014   . Depression   . Fibroids   . Obesity   . Seasonal allergies   . Varicose vein of leg     Family History  Problem Relation Age of Onset  . Cancer Mother        breast/bilateral mastectomies  . Diabetes Other   . Hypertension Other   . Stroke Other   . Obesity Other     Past Surgical History:    Procedure Laterality Date  . BILATERAL SALPINGECTOMY Bilateral 09/21/2014   Procedure: BILATERAL SALPINGECTOMY;  Surgeon: Marvene Staff, MD;  Location: [facility];  Service: Gynecology;  Laterality: Bilateral;  . CESAREAN SECTION  1989  . COLONOSCOPY    . LAPAROSCOPIC GASTRIC BANDING N/A 02/17/2014   Procedure: LAPAROSCOPIC GASTRIC BANDING;  Surgeon: Pedro Earls, MD;  Location: [facility];  Service: General;  Laterality: N/A;  . ROBOT ASSISTED MYOMECTOMY N/A 09/21/2014   Procedure: ROBOTIC ASSISTED MYOMECTOMY;  Surgeon: Marvene Staff, MD;  Location: [facility];  Service: Gynecology;  Laterality: N/A;  . ROBOTIC ASSISTED TOTAL HYSTERECTOMY N/A 09/21/2014   Procedure: ROBOTIC ASSISTED TOTAL HYSTERECTOMY, EXCISION OF PELVIC ENDOMETRIOSIS;  Surgeon: Marvene Staff, MD;  Location: [facility];  Service: Gynecology;  Laterality: N/A;  . TONSILLECTOMY    . TOTAL KNEE ARTHROPLASTY Left 07/03/2017   Procedure: LEFT TOTAL KNEE ARTHROPLASTY;  Surgeon: Meredith Pel, MD;  Location: [facility];  Service: Orthopedics;  Laterality: Left;   Social History   Occupational History  . Not on file  Tobacco Use  . Smoking status: Never Smoker  . Smokeless tobacco: Never Used  Substance and Sexual Activity  . Alcohol use: No  . Drug  use: No  . Sexual activity: Not Currently    Birth control/protection: None

## 2017-08-14 DIAGNOSIS — I8390 Asymptomatic varicose veins of unspecified lower extremity: Secondary | ICD-10-CM | POA: Diagnosis not present

## 2017-08-14 DIAGNOSIS — Z471 Aftercare following joint replacement surgery: Secondary | ICD-10-CM | POA: Diagnosis not present

## 2017-08-18 DIAGNOSIS — Z471 Aftercare following joint replacement surgery: Secondary | ICD-10-CM | POA: Diagnosis not present

## 2017-08-18 DIAGNOSIS — I8390 Asymptomatic varicose veins of unspecified lower extremity: Secondary | ICD-10-CM | POA: Diagnosis not present

## 2017-08-28 ENCOUNTER — Ambulatory Visit: Payer: 59 | Attending: Orthopedic Surgery | Admitting: Physical Therapy

## 2017-08-28 ENCOUNTER — Encounter: Payer: Self-pay | Admitting: Physical Therapy

## 2017-08-28 DIAGNOSIS — M6281 Muscle weakness (generalized): Secondary | ICD-10-CM | POA: Insufficient documentation

## 2017-08-28 DIAGNOSIS — M25562 Pain in left knee: Secondary | ICD-10-CM | POA: Insufficient documentation

## 2017-08-28 DIAGNOSIS — R262 Difficulty in walking, not elsewhere classified: Secondary | ICD-10-CM | POA: Insufficient documentation

## 2017-08-28 DIAGNOSIS — R600 Localized edema: Secondary | ICD-10-CM | POA: Diagnosis not present

## 2017-08-28 NOTE — Therapy (Signed)
[place], [place], [address] Phone: [phone]   Fax:  [phone]  Physical Therapy Evaluation  Patient Details  Name: Mary Morrison MRN: [id number] Date of Birth: 1965/11/29 Referring Provider: Dr. Marlou Sa    Encounter Date: 08/28/2017  PT End of Session - 08/28/17 2019    Visit Number  1    Number of Visits  12    Date for PT Re-Evaluation  10/09/17    PT Start Time  1015    PT Stop Time  1100    PT Time Calculation (min)  45 min    Activity Tolerance  Patient tolerated treatment well    Behavior During Therapy  [facility] for tasks assessed/performed       Past Medical History:  Diagnosis Date  . Arthritis   . Asthma    EXERCISED INDUCED 2014   . Depression   . Fibroids   . Obesity   . Seasonal allergies   . Varicose vein of leg     Past Surgical History:  Procedure Laterality Date  . BILATERAL SALPINGECTOMY Bilateral 09/21/2014   Procedure: BILATERAL SALPINGECTOMY;  Surgeon: Marvene Staff, MD;  Location: [facility];  Service: Gynecology;  Laterality: Bilateral;  . CESAREAN SECTION  1989  . COLONOSCOPY    . LAPAROSCOPIC GASTRIC BANDING N/A 02/17/2014   Procedure: LAPAROSCOPIC GASTRIC BANDING;  Surgeon: Pedro Earls, MD;  Location: [facility];  Service: General;  Laterality: N/A;  . ROBOT ASSISTED MYOMECTOMY N/A 09/21/2014   Procedure: ROBOTIC ASSISTED MYOMECTOMY;  Surgeon: Marvene Staff, MD;  Location: [facility];  Service: Gynecology;  Laterality: N/A;  . ROBOTIC ASSISTED TOTAL HYSTERECTOMY N/A 09/21/2014   Procedure: ROBOTIC ASSISTED TOTAL HYSTERECTOMY, EXCISION OF PELVIC ENDOMETRIOSIS;  Surgeon: Marvene Staff, MD;  Location: [facility];  Service: Gynecology;  Laterality: N/A;  . TONSILLECTOMY    . TOTAL KNEE ARTHROPLASTY Left 07/03/2017   Procedure: LEFT TOTAL KNEE ARTHROPLASTY;  Surgeon: Meredith Pel, MD;  Location: [facility];  Service: Orthopedics;  Laterality: Left;    There  were no vitals filed for this visit.   Subjective Assessment - 08/28/17 1021    Subjective  PT underwent L TKA on 07/03/17.  Had HHPT for 6 weeks.  Surgery was uncomplicated.  She is improving but continues to have functional mobility deficits.  She reports an occasional lateral shift of her L knee in standing (when knee fully extended) which can happen spontaneously.   She can replicate this and it concerns her.  She does not feel confident in her LLE with gait, stairs and balance.  She continues to be limited by fatigue, pain and knee stiffness when in the community, walking and sitting too long.  She does not walk with device.  Her swelling is still present.  Lateral knee is numb.     Pertinent History  obesity, hysterectomy, LE varicose veins     Limitations  Lifting;Standing;Walking;House hold activities;Other (comment);Sitting    How long can you sit comfortably?  30 min     How long can you stand comfortably?  30 min     How long can you walk comfortably?  has not tried, but she can walk the shops about 30 min     Diagnostic tests  XR     Patient Stated Goals  Walk her dogs and hike with friends, going on a a cruise in 2 weeks     Currently in Pain?  Yes  Pain Score  4  pre medicated     Pain Location  Knee    Pain Orientation  Left;Anterior    Pain Descriptors / Indicators  Pins and needles;Other (Comment);Sharp;Aching numbness     Pain Type  Surgical pain    Pain Onset  More than a month ago    Pain Frequency  Intermittent    Aggravating Factors   overactivity, walking and standing     Pain Relieving Factors  OTC meds, ice, pillows , rubbing it     Effect of Pain on Daily Activities  increase in L ankle pain         [facility] PT Assessment - 08/28/17 0001      Assessment   Medical Diagnosis  L TKA     Referring Provider  Dr. Marlou Sa     Onset Date/Surgical Date  07/03/17    Next MD Visit  about 3 weeks     Prior Therapy  HHPT       Precautions   Precautions  None       Restrictions   Weight Bearing Restrictions  No      Balance Screen   Has the patient fallen in the past 6 months  No      [place] residence    Living Arrangements  Alone      Prior Function   Level of Independence  Independent with basic ADLs;Independent with household mobility without device;Independent with community mobility without device;Independent with gait    Vocation  Full time employment;Other (comment)    Vocation Requirements  [employer] , driving     Leisure  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       Cognition   Overall Cognitive Status  Within Functional Limits for tasks assessed      Observation/Other Assessments   Focus on Therapeutic Outcomes (FOTO)   51%      Observation/Other Assessments-Edema    Edema  Circumferential      Circumferential Edema   Circumferential - Right  21 3/4 inch     Circumferential - Left   22 1/2 inch       Sensation   Light Touch  Impaired by gross assessment    Additional Comments  numb, hyposensitive ant lateral knee       Squat   Comments  good form min discomfort      Single Leg Stance   Comments  limited bilaterally increased effort L LE, approx 10 sec       Sit to Stand   Comments  no UE needed, [facility]      Posture/Postural Control   Posture Comments  L knee flexed in stance, varus      AROM   Right Knee Extension  1    Right Knee Flexion  121    Left Knee Extension  4    Left Knee Flexion  106 116 with AAROM       Strength   Right Hip Flexion  4/5    Right Hip Extension  3/5    Right Hip ABduction  3/5    Left Hip Flexion  4/5    Left Hip Extension  3+/5    Left Hip ABduction  3/5    Right Knee Flexion  4+/5    Right Knee Extension  5/5    Left Knee Flexion  4+/5    Left Knee Extension  5/5  Flexibility   Hamstrings  tight bilaterally       Palpation   Patella mobility  hypomobile    Palpation comment  tender, edema, numb anterolaterally        Ambulation/Gait   Ambulation Distance (Feet)  150 Feet    Gait Pattern  Step-through pattern;Decreased stance time - left    Gait Comments  minimal noticeable limp viewed anteriorly, LLE "longer", slight circumduction             Objective measurements completed on examination: See above findings.      [facility] Adult PT Treatment/Exercise - 08/28/17 0001      Self-Care   Self-Care  Heat/Ice Application;Other Self-Care Comments    Heat/Ice Application  ice for swelling    Other Self-Care Comments   HEP, gait , eval findings, ROM       Knee/Hip Exercises: Stretches   Active Hamstring Stretch  Left;3 reps    Knee: Self-Stretch to increase Flexion  Left;5 reps    Knee: Self-Stretch Limitations  sheet for AAROM       Knee/Hip Exercises: Sidelying   Hip ABduction  Strengthening;Both;1 set;20 reps      Cryotherapy   Number Minutes Cryotherapy  8 Minutes    Cryotherapy Location  Knee    Type of Cryotherapy  Ice pack             PT Education - 08/28/17 2112    Education provided  Yes    Education Details  PT/POC, ROM, hip strength , ice, scheduling     Person(s) Educated  Patient    Methods  Explanation;Demonstration;Handout    Comprehension  Verbalized understanding;Returned demonstration       PT Short Term Goals - 08/28/17 2122      PT SHORT TERM GOAL #1   Title  Pt will be I with HEP    Time  3    Period  Weeks    Status  New    Target Date  09/18/17      PT SHORT TERM GOAL #2   Title  Pt will report pain in knee improved 25% with daily activities    Time  3    Period  Weeks    Status  New    Target Date  09/18/17        PT Long Term Goals - 08/28/17 2113      PT LONG TERM GOAL #1   Title  Pt will be I with HEP for knee ROM, strength and core.     Time  8    Period  Weeks    Status  New    Target Date  10/23/17      PT LONG TERM GOAL #2   Title  Pt will be able to walk without noticeable limp in community, most of the time.     Time  8     Period  Weeks    Target Date  10/23/17      PT LONG TERM GOAL #3   Title  Pt will be able to maintain AROM in L knee to <5 deg extension and >115 deg flexion     Time  8    Period  Weeks    Status  New    Target Date  10/23/17      PT LONG TERM GOAL #4   Title  Pt will be able to walk up and down stairs in her home with confidence and no lasting  knee pain.     Time  8    Period  Weeks    Status  New    Target Date  10/23/17      PT LONG TERM GOAL #5   Title  Pt will resume structured exercise routine for lasting benefit and continued weight loss.     Time  8    Period  Weeks    Status  New    Target Date  10/23/17             Plan - 08/28/17 2127    Clinical Impression Statement  Patient reports for low complexity eval of L TKA.  She is progressing well, ROM is 0-4-106 deg.  She has mild swelling, LLE appears longer in supine.  She is mainly lacking strength in core and hips (3/5 to 3+/5) and will benefit from PT to address functional deficits.      Clinical Presentation  Stable    Clinical Decision Making  Low    Rehab Potential  Excellent    PT Frequency  2x / week    PT Duration  8 weeks 12 visits , needs increased time due to vacation upcoming     PT Treatment/Interventions  ADLs/Self Care Home Management;Cryotherapy;Gait training;Therapeutic exercise;Patient/family education;Manual techniques;Passive range of motion;Vasopneumatic Device;Manual lymph drainage;Balance training;Stair training;Electrical Stimulation;Iontophoresis 4mg /ml Dexamethasone;Moist Heat;Therapeutic activities;Functional mobility training;Neuromuscular re-education;Taping    PT Next Visit Plan  hip, core strength, ROM , vaso/tape     PT Home Exercise Plan  HHPT, given flexion ROM today and hip abd in sidelying     Consulted and Agree with Plan of Care  Patient       Patient will benefit from skilled therapeutic intervention in order to improve the following deficits and impairments:  Abnormal  gait, Decreased balance, Decreased mobility, Difficulty walking, Hypomobility, Impaired sensation, Obesity, Pain, Impaired flexibility, Increased fascial restricitons, Decreased strength, Decreased activity tolerance, Decreased range of motion, Increased edema  Visit Diagnosis: Acute pain of left knee  Localized edema  Difficulty in walking, not elsewhere classified  Muscle weakness (generalized)     Problem List Patient Active Problem List   Diagnosis Date Noted  . Arthritis of knee 07/03/2017  . Varicose veins of bilateral lower extremities with other complications 84/16/6063  . Acute pain of left knee 01/25/2017  . Swelling of left knee joint 01/25/2017  . S/P hysterectomy 09/21/2014  . Lapband APS July 2015 02/17/2014  . Edema-chronic pitting in legs 01/20/2014  . Morbid obesity ([place]) 08/28/2013    , 08/28/2017, 9:43 PM  [facility] [address] [place], [place], [address] Phone: [phone]   Fax:  [phone]  Name: Mary Morrison MRN: [id number] Date of Birth: Apr 25, 1966   Raeford Razor, PT 08/28/17 9:44 PM Phone: [phone] Fax: [phone]

## 2017-08-28 NOTE — Patient Instructions (Signed)
Abduction: Side Leg Lift (Eccentric) - Side-Lying    Lie on side. Lift top leg slightly higher than shoulder level. Keep top leg straight with body, toes pointing forward. Slowly lower for 3-5 seconds. ___10-20 reps per set, __1-2_ sets per day, __5-7_ days per week. http://ecce.exer.us/63   Copyright  VHI. All rights reserved.   Knee flexion with sheet for ROM stretching

## 2017-08-30 ENCOUNTER — Telehealth (INDEPENDENT_AMBULATORY_CARE_PROVIDER_SITE_OTHER): Payer: Self-pay | Admitting: Orthopedic Surgery

## 2017-08-30 ENCOUNTER — Encounter: Payer: Self-pay | Admitting: Physical Therapy

## 2017-08-30 ENCOUNTER — Ambulatory Visit: Payer: 59 | Admitting: Physical Therapy

## 2017-08-30 DIAGNOSIS — R600 Localized edema: Secondary | ICD-10-CM

## 2017-08-30 DIAGNOSIS — M25562 Pain in left knee: Secondary | ICD-10-CM | POA: Diagnosis not present

## 2017-08-30 DIAGNOSIS — M6281 Muscle weakness (generalized): Secondary | ICD-10-CM

## 2017-08-30 DIAGNOSIS — R262 Difficulty in walking, not elsewhere classified: Secondary | ICD-10-CM

## 2017-08-30 MED ORDER — AMOXICILLIN 500 MG PO TABS: ORAL_TABLET | ORAL | 0 refills | Status: DC

## 2017-08-30 NOTE — Patient Instructions (Signed)
Prone Hip and Knee Extension    Try to lift operated leg, keeping knee as straight as possible. Do not lift or turn hips. Hold __1-2__ seconds.  Repeat with other leg. Repeat _10-20___ times. Do __1-2__ sessions per day.   Bridging    Slowly raise buttocks from floor, keeping stomach tight.  Hold for 5 seconds. Repeat __15__ times per set. Do __1__ sets per session. Do __1-2__ sessions per day.   Straight Leg Raise: With External Leg Rotation    Lie on back with left leg straight, opposite leg bent. Rotate straight leg out and lift __6-8__ inches. Repeat __10-20__ times per set. Do __1__ sets per session. Do __1-2__ sessions per day.

## 2017-08-30 NOTE — Telephone Encounter (Signed)
Patient called stating that she has a dentist appointment on the 14th and wanted to know if she needs to take an antibiotic.  If so, she requests that it be sent to [facility] on [address].  CB#[phone].  Thank you.

## 2017-08-30 NOTE — Telephone Encounter (Signed)
Sent to pharmacy. LM and advised patient done.

## 2017-08-30 NOTE — Therapy (Signed)
[place], [place], [address] Phone: [phone]   Fax:  [phone]  Physical Therapy Treatment  Patient Details  Name: Mary Morrison MRN: [id number] Date of Birth: 1965/08/11 Referring Provider: Dr. Marlou Sa    Encounter Date: 08/30/2017  PT End of Session - 08/30/17 1144    Visit Number  2    Number of Visits  12    Date for PT Re-Evaluation  10/09/17    PT Start Time  1110 cold pack    PT Stop Time  1153    PT Time Calculation (min)  43 min    Activity Tolerance  Patient tolerated treatment well    Behavior During Therapy  [facility] for tasks assessed/performed       Past Medical History:  Diagnosis Date  . Arthritis   . Asthma    EXERCISED INDUCED 2014   . Depression   . Fibroids   . Obesity   . Seasonal allergies   . Varicose vein of leg     Past Surgical History:  Procedure Laterality Date  . BILATERAL SALPINGECTOMY Bilateral 09/21/2014   Procedure: BILATERAL SALPINGECTOMY;  Surgeon: Marvene Staff, MD;  Location: [facility];  Service: Gynecology;  Laterality: Bilateral;  . CESAREAN SECTION  1989  . COLONOSCOPY    . LAPAROSCOPIC GASTRIC BANDING N/A 02/17/2014   Procedure: LAPAROSCOPIC GASTRIC BANDING;  Surgeon: Pedro Earls, MD;  Location: [facility];  Service: General;  Laterality: N/A;  . ROBOT ASSISTED MYOMECTOMY N/A 09/21/2014   Procedure: ROBOTIC ASSISTED MYOMECTOMY;  Surgeon: Marvene Staff, MD;  Location: [facility];  Service: Gynecology;  Laterality: N/A;  . ROBOTIC ASSISTED TOTAL HYSTERECTOMY N/A 09/21/2014   Procedure: ROBOTIC ASSISTED TOTAL HYSTERECTOMY, EXCISION OF PELVIC ENDOMETRIOSIS;  Surgeon: Marvene Staff, MD;  Location: [facility];  Service: Gynecology;  Laterality: N/A;  . TONSILLECTOMY    . TOTAL KNEE ARTHROPLASTY Left 07/03/2017   Procedure: LEFT TOTAL KNEE ARTHROPLASTY;  Surgeon: Meredith Pel, MD;  Location: [facility];  Service: Orthopedics;  Laterality: Left;     There were no vitals filed for this visit.  Subjective Assessment - 08/30/17 1111    Subjective  doing well; wants to start riding bike on [place]    Patient Stated Goals  Walk her dogs and hike with friends, going on a a cruise in 2 weeks     Currently in Pain?  Yes    Pain Score  4     Pain Location  Knee    Pain Orientation  Left;Anterior    Pain Descriptors / Indicators  Pins and needles;Other (Comment);Sharp;Aching    Pain Onset  More than a month ago    Pain Frequency  Intermittent    Aggravating Factors   overactivity, walking and standing    Pain Relieving Factors  OTC meds, ice, pillows, rubbing it                      [facility] Adult PT Treatment/Exercise - 08/30/17 1115      Exercises   Exercises  Knee/Hip      Knee/Hip Exercises: Aerobic   Recumbent Bike  L1 x 8 min      Knee/Hip Exercises: Supine   Bridges  Both;15 reps    Straight Leg Raise with External Rotation  Both;15 reps      Knee/Hip Exercises: Sidelying   Hip ABduction  Strengthening;Left;15 reps      Knee/Hip Exercises: Prone  Hip Extension  Both;15 reps      Cryotherapy   Number Minutes Cryotherapy  10 Minutes    Cryotherapy Location  Knee    Type of Cryotherapy  Ice pack      Manual Therapy   Manual Therapy  Soft tissue mobilization;Passive ROM    Soft tissue mobilization  scar mobilization    Passive ROM  Lt knee extension             PT Education - 08/30/17 1144    Education provided  Yes    Education Details  HEP    Person(s) Educated  Patient    Methods  Explanation;Demonstration;Handout    Comprehension  Verbalized understanding;Returned demonstration;Need further instruction       PT Short Term Goals - 08/28/17 2122      PT SHORT TERM GOAL #1   Title  Pt will be I with HEP    Time  3    Period  Weeks    Status  New    [place] Date  09/18/17      PT SHORT TERM GOAL #2   Title  Pt will report pain in knee improved 25% with daily activities    Time   3    Period  Weeks    Status  New    [place] Date  09/18/17        PT Long Term Goals - 08/28/17 2113      PT LONG TERM GOAL #1   Title  Pt will be I with HEP for knee ROM, strength and core.     Time  8    Period  Weeks    Status  New    [place] Date  10/23/17      PT LONG TERM GOAL #2   Title  Pt will be able to walk without noticeable limp in community, most of the time.     Time  8    Period  Weeks    Target Date  10/23/17      PT LONG TERM GOAL #3   Title  Pt will be able to maintain AROM in L knee to <5 deg extension and >115 deg flexion     Time  8    Period  Weeks    Status  New    Target Date  10/23/17      PT LONG TERM GOAL #4   Title  Pt will be able to walk up and down stairs in her home with confidence and no lasting knee pain.     Time  8    Period  Weeks    Status  New    Target Date  10/23/17      PT LONG TERM GOAL #5   Title  Pt will resume structured exercise routine for lasting benefit and continued weight loss.     Time  8    Period  Weeks    Status  New    Target Date  10/23/17            Plan - 08/30/17 1145    Clinical Impression Statement  Pt tolerated session well today, forgot to bring exercises today so added additional hip strengthening exercises today.  Advised to bring so we can help update all her exercises.    PT Treatment/Interventions  ADLs/Self Care Home Management;Cryotherapy;Gait training;Therapeutic exercise;Patient/family education;Manual techniques;Passive range of motion;Vasopneumatic Device;Manual lymph drainage;Balance training;Stair training;Electrical Stimulation;Iontophoresis 4mg /ml Dexamethasone;Moist Heat;Therapeutic activities;Functional mobility training;Neuromuscular re-education;Taping  PT Next Visit Plan  hip, core strength, ROM , vaso/tape     Consulted and Agree with Plan of Care  Patient       Patient will benefit from skilled therapeutic intervention in order to improve the following deficits and  impairments:  Abnormal gait, Decreased balance, Decreased mobility, Difficulty walking, Hypomobility, Impaired sensation, Obesity, Pain, Impaired flexibility, Increased fascial restricitons, Decreased strength, Decreased activity tolerance, Decreased range of motion, Increased edema  Visit Diagnosis: Acute pain of left knee  Localized edema  Difficulty in walking, not elsewhere classified  Muscle weakness (generalized)     Problem List Patient Active Problem List   Diagnosis Date Noted  . Arthritis of knee 07/03/2017  . Varicose veins of bilateral lower extremities with other complications 03/05/8870  . Acute pain of left knee 01/25/2017  . Swelling of left knee joint 01/25/2017  . S/P hysterectomy 09/21/2014  . Lapband APS July 2015 02/17/2014  . Edema-chronic pitting in legs 01/20/2014  . Morbid obesity ([place]) 08/28/2013       Laureen Abrahams, PT, DPT 08/30/17 11:49 AM    [facility] [address] [place], [place], [address] Phone: [phone]   Fax:  [phone]  Name: Mary Morrison MRN: [id number] Date of Birth: 1966-07-12

## 2017-09-04 ENCOUNTER — Encounter: Payer: Self-pay | Admitting: Physical Therapy

## 2017-09-04 ENCOUNTER — Ambulatory Visit: Payer: 59 | Admitting: Physical Therapy

## 2017-09-04 DIAGNOSIS — R600 Localized edema: Secondary | ICD-10-CM

## 2017-09-04 DIAGNOSIS — M25562 Pain in left knee: Secondary | ICD-10-CM

## 2017-09-04 DIAGNOSIS — R262 Difficulty in walking, not elsewhere classified: Secondary | ICD-10-CM

## 2017-09-04 DIAGNOSIS — M6281 Muscle weakness (generalized): Secondary | ICD-10-CM

## 2017-09-04 NOTE — Patient Instructions (Signed)
HIP: Abduction - Side-Lying    Lie on side, bottom leg bent for stability, top leg straight and in line with trunk. Squeeze glutes. Raise top leg up and slightly back. Point toes forward. _10__ reps per set, __3_ sets per day, _7__ days per week Bend bottom leg to stabilize pelvis.  Copyright  VHI. All rights reserved.    HIP: Abduction / External Rotation (Band)   Iinitially do not place band around knees.  Lie on side with hips and knees bent. Raise top knee up, squeezing glutes and bring belly button to spine.  Exhale when raising left knee up.(like a clamshell. Keep feet together. Hold _3__ seconds. Use __red ______ band when able to do 3 x 10 reps without pain    Step Down: Anterior   From 4-6" step, reach one leg forward as far as possible , you may not be able to touch heel to step ( that's ok.) , still able to return easily. Touch toe and heel to floor. Return to single leg balance. Repeat with other leg. Do slowly and good motor control. Knee straight and bent over 2nd toe.  Do not allow side to side movement. Repeat __3 x 10__ times. Do 1-2____ sessions per day.   Copyright  VHI. All rights reserved.  Step-Up: Lateral  Lower right leg slowly with ankle bent up. As if touching heel to floor.  You might not be able to do that initially. This is eccentric control (Lengthening of quad muscle). Left knee on step bends slightly.  Do slowly 10 x 3. Rest in between.  Do not do if it causes pain. Copyright  VHI. All rights reserved.   Voncille Lo, PT Certified Exercise Expert for the Aging Adult  09/04/17 9:35 AM Phone: [phone] Fax: [phone]

## 2017-09-04 NOTE — Therapy (Signed)
[place], [place], [address] Phone: [phone]   Fax:  [phone]  Physical Therapy Treatment  Patient Details  Name: Mary Morrison MRN: [id number] Date of Birth: 1966/02/06 Referring Provider: Dr. Marlou Sa    Encounter Date: 09/04/2017  PT End of Session - 09/04/17 0910    Visit Number  3    Number of Visits  12    Date for PT Re-Evaluation  10/09/17    PT Start Time  0902 came 15 minutes late reminded of future appt    PT Stop Time  0946    PT Time Calculation (min)  44 min       Past Medical History:  Diagnosis Date  . Arthritis   . Asthma    EXERCISED INDUCED 2014   . Depression   . Fibroids   . Obesity   . Seasonal allergies   . Varicose vein of leg     Past Surgical History:  Procedure Laterality Date  . BILATERAL SALPINGECTOMY Bilateral 09/21/2014   Procedure: BILATERAL SALPINGECTOMY;  Surgeon: Marvene Staff, MD;  Location: [facility];  Service: Gynecology;  Laterality: Bilateral;  . CESAREAN SECTION  1989  . COLONOSCOPY    . LAPAROSCOPIC GASTRIC BANDING N/A 02/17/2014   Procedure: LAPAROSCOPIC GASTRIC BANDING;  Surgeon: Pedro Earls, MD;  Location: [facility];  Service: General;  Laterality: N/A;  . ROBOT ASSISTED MYOMECTOMY N/A 09/21/2014   Procedure: ROBOTIC ASSISTED MYOMECTOMY;  Surgeon: Marvene Staff, MD;  Location: [facility];  Service: Gynecology;  Laterality: N/A;  . ROBOTIC ASSISTED TOTAL HYSTERECTOMY N/A 09/21/2014   Procedure: ROBOTIC ASSISTED TOTAL HYSTERECTOMY, EXCISION OF PELVIC ENDOMETRIOSIS;  Surgeon: Marvene Staff, MD;  Location: [facility];  Service: Gynecology;  Laterality: N/A;  . TONSILLECTOMY    . TOTAL KNEE ARTHROPLASTY Left 07/03/2017   Procedure: LEFT TOTAL KNEE ARTHROPLASTY;  Surgeon: Meredith Pel, MD;  Location: [place];  Service: Orthopedics;  Laterality: Left;    There were no vitals filed for this visit.  Subjective Assessment - 09/04/17  0906    Subjective  I have been using a stationary bike at the gym.  I  am trying to use OTC medication 2 x a day.    Pertinent History  obesity, hysterectomy, LE varicose veins     Pain Score  4     Pain Location  Knee    Pain Orientation  Left;Anterior    Pain Descriptors / Indicators  Aching    Pain Type  Surgical pain    Pain Onset  More than a month ago    Pain Frequency  Intermittent         [facility] PT Assessment - 09/04/17 0937      AROM   Left Knee Extension  2    Left Knee Flexion  115      Palpation   Palpation comment  tenderness over lateral anterior knee.  Pt complaint of popping laterally in stance                  [facility] Adult PT Treatment/Exercise - 09/04/17 0903      Exercises   Exercises  Knee/Hip      Knee/Hip Exercises: Stretches   Active Hamstring Stretch  Left;3 reps with strap and IT band with Strap 2 x 30 sec left      Knee/Hip Exercises: Aerobic   Recumbent Bike  L1 x 5 min      Knee/Hip  Exercises: Standing   Lateral Step Up Limitations  eccentric lowering of right LE with left knee eccentric quad x 15 hadnhold 2 to stairrail    Step Down  15 reps;Left;Hand Hold: 2    Step Down Limitations  some pain laterally 13 -15 rep left    Other Standing Knee Exercises  retro steppping 20 feet x 2      Knee/Hip Exercises: Supine   Bridges  15 reps;Left    Straight Leg Raise with External Rotation  15 reps;Left 2.5 lb      Knee/Hip Exercises: Sidelying   Hip ABduction  Strengthening;Left;15 reps fatiguse, VC for abd set with exercise    Clams  15 x without t band concentric , Pt complains of anterior lateral pain of left knee at end of reps      Knee/Hip Exercises: Prone   Hip Extension  --      Cryotherapy   Cryotherapy Location  --      Manual Therapy   Manual Therapy  Soft tissue mobilization;Passive ROM    Soft tissue mobilization  retrograde massage of left knee and thigh    Passive ROM  Lt knee extension with towel roll under ankle  left               PT Short Term Goals - 09/04/17 0959      PT SHORT TERM GOAL #1   Title  Pt will be I with HEP    Baseline  Pt given initial HEP    Time  3    Period  Weeks    Status  On-going      PT SHORT TERM GOAL #2   Title  Pt will report pain in knee improved 25% with daily activities    Time  3    Period  Weeks    Status  On-going        PT Long Term Goals - 08/28/17 2113      PT LONG TERM GOAL #1   Title  Pt will be I with HEP for knee ROM, strength and core.     Time  8    Period  Weeks    Status  New    [place] Date  10/23/17      PT LONG TERM GOAL #2   Title  Pt will be able to walk without noticeable limp in community, most of the time.     Time  8    Period  Weeks    Target Date  10/23/17      PT LONG TERM GOAL #3   Title  Pt will be able to maintain AROM in L knee to <5 deg extension and >115 deg flexion     Time  8    Period  Weeks    Status  New    [place] Date  10/23/17      PT LONG TERM GOAL #4   Title  Pt will be able to walk up and down stairs in her home with confidence and no lasting knee pain.     Time  8    Period  Weeks    Status  New    Target Date  10/23/17      PT LONG TERM GOAL #5   Title  Pt will resume structured exercise routine for lasting benefit and continued weight loss.     Time  8    Period  Weeks    Status  New  Target Date  10/23/17            Plan - 09/04/17 0956    Clinical Impression Statement  Pt still complains of pain in anterior lateral left knee and " popping of left Knee"  Pt is going to go on cruise this next week and wants to have a good home program to take on cruise to make sure her knee is healing and strengthening.  Pt was updated on HEP today to include IT band stretch to help with anterior lateral  " popping.  Will assess on thursday.  left knee ext flex  +2/115 today at end of treatment    Rehab Potential  Excellent    PT Frequency  2x / week    PT Duration  8 weeks    PT  Treatment/Interventions  ADLs/Self Care Home Management;Cryotherapy;Gait training;Therapeutic exercise;Patient/family education;Manual techniques;Passive range of motion;Vasopneumatic Device;Manual lymph drainage;Balance training;Stair training;Electrical Stimulation;Iontophoresis 4mg /ml Dexamethasone;Moist Heat;Therapeutic activities;Functional mobility training;Neuromuscular re-education;Taping    PT Next Visit Plan  hip, core strength, ROM , vaso/tape     PT Home Exercise Plan  HHPT, given flexion ROM today and hip abd in sidelying , clams and lateral step ups and eccentric lowering of left quad on step, IT band stretch    Consulted and Agree with Plan of Care  Patient       Patient will benefit from skilled therapeutic intervention in order to improve the following deficits and impairments:  Abnormal gait, Decreased balance, Decreased mobility, Difficulty walking, Hypomobility, Impaired sensation, Obesity, Pain, Impaired flexibility, Increased fascial restricitons, Decreased strength, Decreased activity tolerance, Decreased range of motion, Increased edema  Visit Diagnosis: Acute pain of left knee  Localized edema  Difficulty in walking, not elsewhere classified  Muscle weakness (generalized)     Problem List Patient Active Problem List   Diagnosis Date Noted  . Arthritis of knee 07/03/2017  . Varicose veins of bilateral lower extremities with other complications 91/69/4503  . Acute pain of left knee 01/25/2017  . Swelling of left knee joint 01/25/2017  . S/P hysterectomy 09/21/2014  . Lapband APS July 2015 02/17/2014  . Edema-chronic pitting in legs 01/20/2014  . Morbid obesity ([place]) 08/28/2013   Voncille Lo, PT Certified Exercise Expert for the Aging Adult  09/04/17 10:02 AM Phone: [phone] Fax: [facility] [address] [place], [place], [address] Phone: [phone]   Fax:  [phone]  Name:  Mary Morrison MRN: [id number] Date of Birth: [birth year]

## 2017-09-06 ENCOUNTER — Encounter: Payer: Self-pay | Admitting: Physical Therapy

## 2017-09-06 ENCOUNTER — Ambulatory Visit: Payer: 59 | Admitting: Physical Therapy

## 2017-09-06 DIAGNOSIS — R600 Localized edema: Secondary | ICD-10-CM

## 2017-09-06 DIAGNOSIS — R262 Difficulty in walking, not elsewhere classified: Secondary | ICD-10-CM

## 2017-09-06 DIAGNOSIS — M6281 Muscle weakness (generalized): Secondary | ICD-10-CM

## 2017-09-06 DIAGNOSIS — M25562 Pain in left knee: Secondary | ICD-10-CM | POA: Diagnosis not present

## 2017-09-06 NOTE — Patient Instructions (Addendum)
   Terminal Knee Extension (Standing)    Facing anchor with right knee slightly bent and tubing just above knee, gently pull knee back straight. Or you can use a slightly deflated ball. Do not overextend knee. Repeat _20___ times per set. Do _2___ sets per session. Do 2____ sessions per day.  http://orth.exer.us/667   Copyright  VHI. All rights reserved.   Voncille Lo, PT Certified Exercise Expert for the Aging Adult  09/06/17 8:26 AM Phone: [phone] Fax: [phone]

## 2017-09-06 NOTE — Therapy (Signed)
[facility] [place], [place], [address] Phone: [phone]   Fax:  [phone]  Physical Therapy Treatment  Patient Details  Name: Mary Morrison MRN: [id number] Date of Birth: Jul 18, 1966 Referring Provider: Dr. Marlou Sa    Encounter Date: 09/06/2017  PT End of Session - 09/06/17 0816    Visit Number  4    Number of Visits  12    Date for PT Re-Evaluation  10/09/17    PT Start Time  0807    PT Stop Time  0850    PT Time Calculation (min)  43 min    Activity Tolerance  Patient tolerated treatment well    Behavior During Therapy  [facility] for tasks assessed/performed       Past Medical History:  Diagnosis Date  . Arthritis   . Asthma    EXERCISED INDUCED 2014   . Depression   . Fibroids   . Obesity   . Seasonal allergies   . Varicose vein of leg     Past Surgical History:  Procedure Laterality Date  . BILATERAL SALPINGECTOMY Bilateral 09/21/2014   Procedure: BILATERAL SALPINGECTOMY;  Surgeon: Marvene Staff, MD;  Location: [facility];  Service: Gynecology;  Laterality: Bilateral;  . CESAREAN SECTION  1989  . COLONOSCOPY    . LAPAROSCOPIC GASTRIC BANDING N/A 02/17/2014   Procedure: LAPAROSCOPIC GASTRIC BANDING;  Surgeon: Pedro Earls, MD;  Location: [facility];  Service: General;  Laterality: N/A;  . ROBOT ASSISTED MYOMECTOMY N/A 09/21/2014   Procedure: ROBOTIC ASSISTED MYOMECTOMY;  Surgeon: Marvene Staff, MD;  Location: [facility];  Service: Gynecology;  Laterality: N/A;  . ROBOTIC ASSISTED TOTAL HYSTERECTOMY N/A 09/21/2014   Procedure: ROBOTIC ASSISTED TOTAL HYSTERECTOMY, EXCISION OF PELVIC ENDOMETRIOSIS;  Surgeon: Marvene Staff, MD;  Location: [facility];  Service: Gynecology;  Laterality: N/A;  . TONSILLECTOMY    . TOTAL KNEE ARTHROPLASTY Left 07/03/2017   Procedure: LEFT TOTAL KNEE ARTHROPLASTY;  Surgeon: Meredith Pel, MD;  Location: [facility];  Service: Orthopedics;  Laterality: Left;    There  were no vitals filed for this visit.  Subjective Assessment - 09/06/17 0810    Subjective  I did not do any exercise since I was here at PT on Tuesday.  I am getting ready for a cruise where I will spend every day with My exercises program.  I am going on a hiking excursion    Pertinent History  obesity, hysterectomy, LE varicose veins     Limitations  Lifting;Standing;Walking;House hold activities;Other (comment);Sitting    Diagnostic tests  XR     Patient Stated Goals  Walk her dogs and hike with friends, going on a a cruise in 2 weeks     Currently in Pain?  Yes    Pain Score  3     Pain Location  Knee    Pain Orientation  Left;Anterior    Pain Descriptors / Indicators  Aching    Pain Type  Surgical pain    Pain Onset  More than a month ago    Pain Frequency  Intermittent         [facility] PT Assessment - 09/06/17 0858      AROM   Left Knee Extension  2    Left Knee Flexion  116                  [facility] Adult PT Treatment/Exercise - 09/06/17 0820      Knee/Hip Exercises: Stretches  Active Hamstring Stretch  Left;3 reps with strap and IT band with Strap 3 x 30 sec left      Knee/Hip Exercises: Aerobic   Recumbent Bike  L1 x 8 min      Knee/Hip Exercises: Standing   Terminal Knee Extension  2 sets;20 reps;Strengthening 20 x with ball and 20 x with red t band    Wall Squat  10 reps;10 seconds;2 sets    SLS with Vectors  10 x r sets clocks    Other Standing Knee Exercises  retro steppping 20 feet x 2    Other Standing Knee Exercises  on step bil gastroc stretching of step and then heel raise x 20      Knee/Hip Exercises: Supine   Bridges  Left;Strengthening;20 reps    Straight Leg Raise with External Rotation  Left;Strengthening;15 reps 5 lb      Knee/Hip Exercises: Sidelying   Hip ABduction  Strengthening    Clams  20 x without t band today VC for breathing             PT Education - 09/06/17 0826    Education provided  Yes    Education Details  added  to HEP Proprioception exercise and heel raises on step, TKE reinforced exercises importance for recovery educated briefly on  patellar mobilzation and importance of reducing swelling    Person(s) Educated  Patient    Methods  Explanation;Demonstration;Tactile cues;Handout    Comprehension  Verbalized understanding;Returned demonstration       PT Short Term Goals - 09/06/17 0859      PT SHORT TERM GOAL #1   Title  Pt will be I with HEP    Baseline  Pt given inital HEP and Proprioception exercises    Time  3    Period  Weeks    Status  On-going      PT SHORT TERM GOAL #2   Title  Pt will report pain in knee improved 25% with daily activities    Baseline  3/10   eval 4/10    Time  3    Period  Weeks    Status  Achieved        PT Long Term Goals - 08/28/17 2113      PT LONG TERM GOAL #1   Title  Pt will be I with HEP for knee ROM, strength and core.     Time  8    Period  Weeks    Status  New    Target Date  10/23/17      PT LONG TERM GOAL #2   Title  Pt will be able to walk without noticeable limp in community, most of the time.     Time  8    Period  Weeks    Target Date  10/23/17      PT LONG TERM GOAL #3   Title  Pt will be able to maintain AROM in L knee to <5 deg extension and >115 deg flexion     Time  8    Period  Weeks    Status  New    Target Date  10/23/17      PT LONG TERM GOAL #4   Title  Pt will be able to walk up and down stairs in her home with confidence and no lasting knee pain.     Time  8    Period  Weeks    Status  New  Target Date  10/23/17      PT LONG TERM GOAL #5   Title  Pt will resume structured exercise routine for lasting benefit and continued weight loss.     Time  8    Period  Weeks    Status  New    Target Date  10/23/17            Plan - 09/06/17 0816    Clinical Impression Statement  Pt initially 3/10 in aching pain in left knee.  Pt had not performed any exercises since being in PT on Tuesday. Importance of  exericise daily emphasized for maximal benefit and success of TKA.  STG # 2 achieved. Pain 3/10 from 4/10 at eval AROM left knee ext/flex 2/116    Rehab Potential  Excellent    PT Frequency  2x / week    PT Duration  8 weeks    PT Treatment/Interventions  ADLs/Self Care Home Management;Cryotherapy;Gait training;Therapeutic exercise;Patient/family education;Manual techniques;Passive range of motion;Vasopneumatic Device;Manual lymph drainage;Balance training;Stair training;Electrical Stimulation;Iontophoresis 4mg /ml Dexamethasone;Moist Heat;Therapeutic activities;Functional mobility training;Neuromuscular re-education;Taping    PT Next Visit Plan  hip, core strength, ROM , Assess Goals post cruise    PT Home Exercise Plan  HHPT, given flexion ROM today and hip abd in sidelying , clams and lateral step ups and eccentric lowering of left quad on step, IT band stretch    Consulted and Agree with Plan of Care  Patient       Patient will benefit from skilled therapeutic intervention in order to improve the following deficits and impairments:  Abnormal gait, Decreased balance, Decreased mobility, Difficulty walking, Hypomobility, Impaired sensation, Obesity, Pain, Impaired flexibility, Increased fascial restricitons, Decreased strength, Decreased activity tolerance, Decreased range of motion, Increased edema  Visit Diagnosis: Acute pain of left knee  Localized edema  Difficulty in walking, not elsewhere classified  Muscle weakness (generalized)     Problem List Patient Active Problem List   Diagnosis Date Noted  . Arthritis of knee 07/03/2017  . Varicose veins of bilateral lower extremities with other complications 33/35/4562  . Acute pain of left knee 01/25/2017  . Swelling of left knee joint 01/25/2017  . S/P hysterectomy 09/21/2014  . Lapband APS July 2015 02/17/2014  . Edema-chronic pitting in legs 01/20/2014  . Morbid obesity ([place]) 08/28/2013    Voncille Lo, PT Certified  Exercise Expert for the Aging Adult  09/06/17 9:01 AM Phone: [phone] Fax: [place] [facility] [address] [place], [place], [address] Phone: [phone]   Fax:  [phone]  Name: AILYNN GOW MRN: [id number] Date of Birth: July 04, 1966

## 2017-09-17 ENCOUNTER — Encounter (INDEPENDENT_AMBULATORY_CARE_PROVIDER_SITE_OTHER): Payer: Self-pay | Admitting: Orthopedic Surgery

## 2017-09-17 ENCOUNTER — Ambulatory Visit (INDEPENDENT_AMBULATORY_CARE_PROVIDER_SITE_OTHER): Payer: 59

## 2017-09-17 ENCOUNTER — Ambulatory Visit (INDEPENDENT_AMBULATORY_CARE_PROVIDER_SITE_OTHER): Payer: 59 | Admitting: Orthopedic Surgery

## 2017-09-17 DIAGNOSIS — Z96652 Presence of left artificial knee joint: Secondary | ICD-10-CM | POA: Diagnosis not present

## 2017-09-18 ENCOUNTER — Encounter: Payer: Self-pay | Admitting: Physical Therapy

## 2017-09-18 ENCOUNTER — Ambulatory Visit: Payer: 59 | Admitting: Physical Therapy

## 2017-09-18 ENCOUNTER — Encounter (INDEPENDENT_AMBULATORY_CARE_PROVIDER_SITE_OTHER): Payer: Self-pay | Admitting: Orthopedic Surgery

## 2017-09-18 DIAGNOSIS — R600 Localized edema: Secondary | ICD-10-CM

## 2017-09-18 DIAGNOSIS — M6281 Muscle weakness (generalized): Secondary | ICD-10-CM

## 2017-09-18 DIAGNOSIS — R262 Difficulty in walking, not elsewhere classified: Secondary | ICD-10-CM

## 2017-09-18 DIAGNOSIS — M25562 Pain in left knee: Secondary | ICD-10-CM | POA: Diagnosis not present

## 2017-09-18 NOTE — Therapy (Signed)
[facility], [place], [address] Phone: [phone]   Fax:  [phone]  Physical Therapy Treatment  Patient Details  Name: Mary Morrison MRN: [id number] Date of Birth: [birth year] Referring Provider: Dr. Marlou Sa    Encounter Date: 09/18/2017  PT End of Session - 09/18/17 1016    Visit Number  5    Number of Visits  12    Date for PT Re-Evaluation  10/09/17    PT Start Time  0932    PT Stop Time  1026    PT Time Calculation (min)  54 min    Activity Tolerance  Patient tolerated treatment well    Behavior During Therapy  [facility] for tasks assessed/performed       Past Medical History:  Diagnosis Date  . Arthritis   . Asthma    EXERCISED INDUCED 2014   . Depression   . Fibroids   . Obesity   . Seasonal allergies   . Varicose vein of leg     Past Surgical History:  Procedure Laterality Date  . BILATERAL SALPINGECTOMY Bilateral 09/21/2014   Procedure: BILATERAL SALPINGECTOMY;  Surgeon: Marvene Staff, MD;  Location: [facility];  Service: Gynecology;  Laterality: Bilateral;  . CESAREAN SECTION  1989  . COLONOSCOPY    . LAPAROSCOPIC GASTRIC BANDING N/A 02/17/2014   Procedure: LAPAROSCOPIC GASTRIC BANDING;  Surgeon: Pedro Earls, MD;  Location: [facility];  Service: General;  Laterality: N/A;  . ROBOT ASSISTED MYOMECTOMY N/A 09/21/2014   Procedure: ROBOTIC ASSISTED MYOMECTOMY;  Surgeon: Marvene Staff, MD;  Location: [facility];  Service: Gynecology;  Laterality: N/A;  . ROBOTIC ASSISTED TOTAL HYSTERECTOMY N/A 09/21/2014   Procedure: ROBOTIC ASSISTED TOTAL HYSTERECTOMY, EXCISION OF PELVIC ENDOMETRIOSIS;  Surgeon: Marvene Staff, MD;  Location: [facility];  Service: Gynecology;  Laterality: N/A;  . TONSILLECTOMY    . TOTAL KNEE ARTHROPLASTY Left 07/03/2017   Procedure: LEFT TOTAL KNEE ARTHROPLASTY;  Surgeon: Meredith Pel, MD;  Location: [facility];  Service: Orthopedics;  Laterality: Left;    There  were no vitals filed for this visit.  Subjective Assessment - 09/18/17 8413    Subjective  Saw Dr. Marlou Sa yesterday.  He was ;pleased with ROM.  Mentioned the lateral shifting of knee and he XR it and it was healed, just needs strengthening .  Went on a cruise and worked on it everyday.    Currently in Pain?  No/denies    Pain Location  Knee    Pain Orientation  Left;Anterior    Pain Descriptors / Indicators  Aching    Pain Type  Surgical pain    Pain Onset  More than a month ago    Pain Frequency  Intermittent    Aggravating Factors   end of the day     Pain Relieving Factors  rest, rubbing it            [facility] Adult PT Treatment/Exercise - 09/18/17 0001      Knee/Hip Exercises: Stretches   Active Hamstring Stretch  Left;3 reps with strap and IT band with Strap 3 x 30 sec left    Knee: Self-Stretch to increase Flexion  Left;5 reps      Knee/Hip Exercises: Aerobic   Stationary Bike  L3 for 6 min warm up       Knee/Hip Exercises: Standing   Hip Abduction  Stengthening;Both;1 set;10 reps    Lateral Step Up  Both;1 set;10 reps  Forward Step Up  Both;1 set;15 reps;Hand Hold: 1;Step Height: 8"    Other Standing Knee Exercises  standing reverse lunge x 10 with towel as slider       Knee/Hip Exercises: Supine   Bridges  Strengthening;Both;2 sets;10 reps    Bridges with [employer]  Strengthening;Both;1 set      Knee/Hip Exercises: Sidelying   Hip ABduction  Strengthening;Both;1 set;10 reps    Clams  15 reps with greenband       Cryotherapy   Number Minutes Cryotherapy  10 Minutes    Cryotherapy Location  Knee    Type of Cryotherapy  Ice pack             PT Education - 09/18/17 1145    Education provided  Yes    Education Details  will streamline HEP next visit    Person(s) Educated  Patient    Methods  Explanation    Comprehension  Verbalized understanding       PT Short Term Goals - 09/18/17 1004      PT SHORT TERM GOAL #1   Title  Pt will be I with HEP     Status  Achieved      PT SHORT TERM GOAL #2   Title  Pt will report pain in knee improved 25% with daily activities    Status  Achieved        PT Long Term Goals - 09/18/17 1006      PT LONG TERM GOAL #1   Title  Pt will be I with HEP for knee ROM, strength and core.     Status  On-going      PT LONG TERM GOAL #2   Title  Pt will be able to walk without noticeable limp in community, most of the time.     Baseline  min limp     Status  On-going      PT LONG TERM GOAL #3   Title  Pt will be able to maintain AROM in L knee to <5 deg extension and >115 deg flexion     Status  On-going      PT LONG TERM GOAL #4   Title  Pt will be able to walk up and down stairs in her home with confidence and no lasting knee pain.     Status  On-going      PT LONG TERM GOAL #5   Title  Pt will resume structured exercise routine for lasting benefit and continued weight loss.     Status  On-going            Plan - 09/18/17 1145    Clinical Impression Statement  Patient without pain.  Spent 2 hours each day doing her HEP, assured her that that was not necessary but applauded her efforts.  AROM measured end of session 0-6-115 but focused more on strengthening today.     PT Treatment/Interventions  ADLs/Self Care Home Management;Cryotherapy;Gait training;Therapeutic exercise;Patient/family education;Manual techniques;Passive range of motion;Vasopneumatic Device;Manual lymph drainage;Balance training;Stair training;Electrical Stimulation;Iontophoresis 4mg /ml Dexamethasone;Moist Heat;Therapeutic activities;Functional mobility training;Neuromuscular re-education;Taping    PT Next Visit Plan  hip, core strength, ROM , streamline HEP to < 8 ex    PT Home Exercise Plan  HHPT, given flexion ROM today and hip abd in sidelying , clams and lateral step ups and eccentric lowering of left quad on step, IT band stretch    Consulted and Agree with Plan of Care  Patient  Patient will benefit from  skilled therapeutic intervention in order to improve the following deficits and impairments:  Abnormal gait, Decreased balance, Decreased mobility, Difficulty walking, Hypomobility, Impaired sensation, Obesity, Pain, Impaired flexibility, Increased fascial restricitons, Decreased strength, Decreased activity tolerance, Decreased range of motion, Increased edema  Visit Diagnosis: Acute pain of left knee  Localized edema  Difficulty in walking, not elsewhere classified  Muscle weakness (generalized)     Problem List Patient Active Problem List   Diagnosis Date Noted  . Arthritis of knee 07/03/2017  . Varicose veins of bilateral lower extremities with other complications 70/78/6754  . Acute pain of left knee 01/25/2017  . Swelling of left knee joint 01/25/2017  . S/P hysterectomy 09/21/2014  . Lapband APS July 2015 02/17/2014  . Edema-chronic pitting in legs 01/20/2014  . Morbid obesity ([place]) 08/28/2013    , 09/18/2017, 11:50 AM  [facility] [address] [place], [place], [address] Phone: [phone]   Fax:  [phone]  Name: TERI LEGACY MRN: [id number] Date of Birth: 25-Jun-1966  Raeford Razor, PT 09/18/17 11:50 AM Phone: [phone] Fax: [phone]

## 2017-09-18 NOTE — Progress Notes (Signed)
Office Visit Note   Patient: Mary Morrison           Date of Birth: 1965/09/22           MRN: [id number] Visit Date: 09/17/2017 Requested by: Harlan Stains, MD [place] [address] [place], [place] [address] PCP: Harlan Stains, MD  Subjective: Chief Complaint  Patient presents with  . Left Knee - Routine Post Op    HPI: Patient presents with concerns about her left total knee replacement.  She underwent left total knee replacement 07/03/2017.  Finishes physical therapy this week.  She believes there is a stability to the knee.  She just finished with a cruise to the [place].  Patient was on varus alignment prior to her knee surgery now she is straight.              ROS: All systems reviewed are negative as they relate to the chief complaint within the history of present illness.  Patient denies  fevers or chills.   Assessment & Plan: Visit Diagnoses:  1. History of total left knee replacement     Plan: Impression is no varus thrust when the patient walks but she does have slight laxity there she has been changed from bowlegged varus alignment to straight alignment.  Her knee is stable to varus and valgus stress at 0 and 30 degrees.  She has excellent range of motion.  Radiographs are normal.  Follow-up in 9 months.  He feels like that leg is longer than the other one and likely has been length and relatively speaking just because of the alignment correction.  We will give her a heel lift for the other shoe.  Follow-up in 9 months  Follow-Up Instructions: Return in about 9 months (around 06/17/2018).   Orders:  Orders Placed This Encounter  Procedures  . XR KNEE 3 VIEW LEFT   No orders of the defined types were placed in this encounter.     Procedures: No procedures performed   Clinical Data: No additional findings.  Objective: Vital Signs: LMP 02/10/2014   Physical Exam:   Constitutional: Patient appears well-developed HEENT:  Head:  Normocephalic Eyes:EOM are normal Neck: Normal range of motion Cardiovascular: Normal rate Pulmonary/chest: Effort normal Neurologic: Patient is alert Skin: Skin is warm Psychiatric: Patient has normal mood and affect    Ortho Exam: Orthopedic exam demonstrates excellent range of motion of that left knee with no effusion.  Collateral and cruciate ligaments are stable at 0 and 30 degrees to varus and valgus stress.  Negative patellar apprehension.  There is good anterior posterior stability.  No varus thrust when the patient walks.  Specialty Comments:  No specialty comments available.  Imaging: No results found.   PMFS History: Patient Active Problem List   Diagnosis Date Noted  . Arthritis of knee 07/03/2017  . Varicose veins of bilateral lower extremities with other complications 23/76/2831  . Acute pain of left knee 01/25/2017  . Swelling of left knee joint 01/25/2017  . S/P hysterectomy 09/21/2014  . Lapband APS July 2015 02/17/2014  . Edema-chronic pitting in legs 01/20/2014  . Morbid obesity ([place]) 08/28/2013   Past Medical History:  Diagnosis Date  . Arthritis   . Asthma    EXERCISED INDUCED 2014   . Depression   . Fibroids   . Obesity   . Seasonal allergies   . Varicose vein of leg     Family History  Problem Relation Age of Onset  .  Cancer Mother        breast/bilateral mastectomies  . Diabetes Other   . Hypertension Other   . Stroke Other   . Obesity Other     Past Surgical History:  Procedure Laterality Date  . BILATERAL SALPINGECTOMY Bilateral 09/21/2014   Procedure: BILATERAL SALPINGECTOMY;  Surgeon: Marvene Staff, MD;  Location: [facility];  Service: Gynecology;  Laterality: Bilateral;  . CESAREAN SECTION  1989  . COLONOSCOPY    . LAPAROSCOPIC GASTRIC BANDING N/A 02/17/2014   Procedure: LAPAROSCOPIC GASTRIC BANDING;  Surgeon: Pedro Earls, MD;  Location: [facility];  Service: General;  Laterality: N/A;  . ROBOT ASSISTED MYOMECTOMY N/A  09/21/2014   Procedure: ROBOTIC ASSISTED MYOMECTOMY;  Surgeon: Marvene Staff, MD;  Location: [facility];  Service: Gynecology;  Laterality: N/A;  . ROBOTIC ASSISTED TOTAL HYSTERECTOMY N/A 09/21/2014   Procedure: ROBOTIC ASSISTED TOTAL HYSTERECTOMY, EXCISION OF PELVIC ENDOMETRIOSIS;  Surgeon: Marvene Staff, MD;  Location: [facility];  Service: Gynecology;  Laterality: N/A;  . TONSILLECTOMY    . TOTAL KNEE ARTHROPLASTY Left 07/03/2017   Procedure: LEFT TOTAL KNEE ARTHROPLASTY;  Surgeon: Meredith Pel, MD;  Location: [facility];  Service: Orthopedics;  Laterality: Left;   Social History   Occupational History  . Not on file  Tobacco Use  . Smoking status: Never Smoker  . Smokeless tobacco: Never Used  Substance and Sexual Activity  . Alcohol use: No  . Drug use: No  . Sexual activity: Not Currently    Birth control/protection: None

## 2017-09-20 ENCOUNTER — Ambulatory Visit: Payer: 59 | Admitting: Physical Therapy

## 2017-09-20 ENCOUNTER — Encounter: Payer: Self-pay | Admitting: Physical Therapy

## 2017-09-20 DIAGNOSIS — M6281 Muscle weakness (generalized): Secondary | ICD-10-CM

## 2017-09-20 DIAGNOSIS — R600 Localized edema: Secondary | ICD-10-CM

## 2017-09-20 DIAGNOSIS — R262 Difficulty in walking, not elsewhere classified: Secondary | ICD-10-CM

## 2017-09-20 DIAGNOSIS — M25562 Pain in left knee: Secondary | ICD-10-CM

## 2017-09-20 NOTE — Therapy (Signed)
[place], [place], [address] Phone: [phone]   Fax:  [phone]  Physical Therapy Treatment  Patient Details  Name: Mary Morrison MRN: [id number] Date of Birth: 01/15/1966 Referring Provider: Dr. Marlou Sa    Encounter Date: 09/20/2017  PT End of Session - 09/20/17 1212    Visit Number  6    Number of Visits  12    Date for PT Re-Evaluation  10/09/17    PT Start Time  8338    PT Stop Time  1229    PT Time Calculation (min)  44 min    Activity Tolerance  Patient tolerated treatment well    Behavior During Therapy  [facility] for tasks assessed/performed       Past Medical History:  Diagnosis Date  . Arthritis   . Asthma    EXERCISED INDUCED 2014   . Depression   . Fibroids   . Obesity   . Seasonal allergies   . Varicose vein of leg     Past Surgical History:  Procedure Laterality Date  . BILATERAL SALPINGECTOMY Bilateral 09/21/2014   Procedure: BILATERAL SALPINGECTOMY;  Surgeon: Marvene Staff, MD;  Location: [facility];  Service: Gynecology;  Laterality: Bilateral;  . CESAREAN SECTION  1989  . COLONOSCOPY    . LAPAROSCOPIC GASTRIC BANDING N/A 02/17/2014   Procedure: LAPAROSCOPIC GASTRIC BANDING;  Surgeon: Pedro Earls, MD;  Location: [facility];  Service: General;  Laterality: N/A;  . ROBOT ASSISTED MYOMECTOMY N/A 09/21/2014   Procedure: ROBOTIC ASSISTED MYOMECTOMY;  Surgeon: Marvene Staff, MD;  Location: [facility];  Service: Gynecology;  Laterality: N/A;  . ROBOTIC ASSISTED TOTAL HYSTERECTOMY N/A 09/21/2014   Procedure: ROBOTIC ASSISTED TOTAL HYSTERECTOMY, EXCISION OF PELVIC ENDOMETRIOSIS;  Surgeon: Marvene Staff, MD;  Location: [facility];  Service: Gynecology;  Laterality: N/A;  . TONSILLECTOMY    . TOTAL KNEE ARTHROPLASTY Left 07/03/2017   Procedure: LEFT TOTAL KNEE ARTHROPLASTY;  Surgeon: Meredith Pel, MD;  Location: [facility];  Service: Orthopedics;  Laterality: Left;    There  were no vitals filed for this visit.  Subjective Assessment - 09/20/17 1150    Subjective  2/10. Brought in HEP    Currently in Pain?  No/denies        [facility] Adult PT Treatment/Exercise - 09/20/17 0001      Knee/Hip Exercises: Stretches   Active Hamstring Stretch  Left;3 reps    Knee: Self-Stretch to increase Flexion  Left;3 reps    ITB Stretch  Left;3 reps      Knee/Hip Exercises: Aerobic   Stationary Bike  L3 for 6 min warm up       Knee/Hip Exercises: Standing   Heel Raises  Both;1 set;10 reps    Step Down  Left;2 sets;10 reps;Hand Hold: 1;Step Height: 4";Step Height: 6"      Knee/Hip Exercises: Supine   Bridges  Strengthening;Both;1 set    Straight Leg Raises  Strengthening;Left;1 set    Straight Leg Raise with External Rotation  Strengthening;Left;1 set;10 reps    Other Supine Knee/Hip Exercises  Reviewed core routine: reverse march, regular march       Knee/Hip Exercises: Prone   Hip Extension  Strengthening;Left;1 set;10 reps    Other Prone Exercises  prone quad set x 10     Other Prone Exercises  prone knee flexion with strap x 3       Cryotherapy   Number Minutes Cryotherapy  10 Minutes  Cryotherapy Location  Knee    Type of Cryotherapy  Ice pack             PT Education - 09/20/17 1212    Education provided  Yes    Education Details  HEP overlap     Person(s) Educated  Patient    Methods  Explanation    Comprehension  Verbalized understanding       PT Short Term Goals - 09/18/17 1004      PT SHORT TERM GOAL #1   Title  Pt will be I with HEP    Status  Achieved      PT SHORT TERM GOAL #2   Title  Pt will report pain in knee improved 25% with daily activities    Status  Achieved        PT Long Term Goals - 09/18/17 1006      PT LONG TERM GOAL #1   Title  Pt will be I with HEP for knee ROM, strength and core.     Status  On-going      PT LONG TERM GOAL #2   Title  Pt will be able to walk without noticeable limp in community, most of  the time.     Baseline  min limp     Status  On-going      PT LONG TERM GOAL #3   Title  Pt will be able to maintain AROM in L knee to <5 deg extension and >115 deg flexion     Status  On-going      PT LONG TERM GOAL #4   Title  Pt will be able to walk up and down stairs in her home with confidence and no lasting knee pain.     Status  On-going      PT LONG TERM GOAL #5   Title  Pt will resume structured exercise routine for lasting benefit and continued weight loss.     Status  On-going            Plan - 09/20/17 1257    Clinical Impression Statement  Patient able to walk 3 miles yesterday without much pain.  She had too many exercises in her routine and so we categorized, DC some of the basic ones.  Emphasized to stretch daily.     PT Treatment/Interventions  ADLs/Self Care Home Management;Cryotherapy;Gait training;Therapeutic exercise;Patient/family education;Manual techniques;Passive range of motion;Vasopneumatic Device;Manual lymph drainage;Balance training;Stair training;Electrical Stimulation;Iontophoresis 4mg /ml Dexamethasone;Moist Heat;Therapeutic activities;Functional mobility training;Neuromuscular re-education;Taping    PT Next Visit Plan  ROM, strength, core, has enough exercises    PT Home Exercise Plan  HHPT, given flexion ROM today and hip abd in sidelying , clams and lateral step ups and eccentric lowering of left quad on step, IT band stretch    Consulted and Agree with Plan of Care  Patient       Patient will benefit from skilled therapeutic intervention in order to improve the following deficits and impairments:  Abnormal gait, Decreased balance, Decreased mobility, Difficulty walking, Hypomobility, Impaired sensation, Obesity, Pain, Impaired flexibility, Increased fascial restricitons, Decreased strength, Decreased activity tolerance, Decreased range of motion, Increased edema  Visit Diagnosis: Acute pain of left knee  Localized edema  Muscle weakness  (generalized)  Difficulty in walking, not elsewhere classified     Problem List Patient Active Problem List   Diagnosis Date Noted  . Arthritis of knee 07/03/2017  . Varicose veins of bilateral lower extremities with other complications 32/67/1245  .  Acute pain of left knee 01/25/2017  . Swelling of left knee joint 01/25/2017  . S/P hysterectomy 09/21/2014  . Lapband APS July 2015 02/17/2014  . Edema-chronic pitting in legs 01/20/2014  . Morbid obesity ([place]) 08/28/2013    , 09/20/2017, 1:01 PM  [facility] [address] [place], [place], [address] Phone: [phone]   Fax:  [phone]  Name: Mary Morrison MRN: [id number] Date of Birth: 1966/04/16  Raeford Razor, PT 09/20/17 1:01 PM Phone: [phone] Fax: [phone]

## 2017-09-25 ENCOUNTER — Ambulatory Visit: Payer: 59 | Admitting: Physical Therapy

## 2017-09-27 ENCOUNTER — Ambulatory Visit: Payer: 59 | Attending: Orthopedic Surgery | Admitting: Physical Therapy

## 2017-09-27 ENCOUNTER — Encounter: Payer: Self-pay | Admitting: Physical Therapy

## 2017-09-27 DIAGNOSIS — M6281 Muscle weakness (generalized): Secondary | ICD-10-CM

## 2017-09-27 DIAGNOSIS — M25562 Pain in left knee: Secondary | ICD-10-CM | POA: Insufficient documentation

## 2017-09-27 DIAGNOSIS — R262 Difficulty in walking, not elsewhere classified: Secondary | ICD-10-CM

## 2017-09-27 DIAGNOSIS — R600 Localized edema: Secondary | ICD-10-CM | POA: Diagnosis not present

## 2017-09-27 NOTE — Therapy (Signed)
[place], [place], [address] Phone: [phone]   Fax:  [phone]  Physical Therapy Treatment  Patient Details  Name: Mary Morrison MRN: [id number] Date of Birth: May 23, 1966 Referring Provider: Dr. Marlou Sa    Encounter Date: 09/27/2017  PT End of Session - 09/27/17 1105    Visit Number  7    Number of Visits  12    Date for PT Re-Evaluation  10/09/17    PT Start Time  1020    PT Stop Time  1102    PT Time Calculation (min)  42 min    Activity Tolerance  Patient tolerated treatment well    Behavior During Therapy  [facility] for tasks assessed/performed       Past Medical History:  Diagnosis Date  . Arthritis   . Asthma    EXERCISED INDUCED 2014   . Depression   . Fibroids   . Obesity   . Seasonal allergies   . Varicose vein of leg     Past Surgical History:  Procedure Laterality Date  . BILATERAL SALPINGECTOMY Bilateral 09/21/2014   Procedure: BILATERAL SALPINGECTOMY;  Surgeon: Marvene Staff, MD;  Location: [facility];  Service: Gynecology;  Laterality: Bilateral;  . CESAREAN SECTION  1989  . COLONOSCOPY    . LAPAROSCOPIC GASTRIC BANDING N/A 02/17/2014   Procedure: LAPAROSCOPIC GASTRIC BANDING;  Surgeon: Pedro Earls, MD;  Location: [facility];  Service: General;  Laterality: N/A;  . ROBOT ASSISTED MYOMECTOMY N/A 09/21/2014   Procedure: ROBOTIC ASSISTED MYOMECTOMY;  Surgeon: Marvene Staff, MD;  Location: [facility];  Service: Gynecology;  Laterality: N/A;  . ROBOTIC ASSISTED TOTAL HYSTERECTOMY N/A 09/21/2014   Procedure: ROBOTIC ASSISTED TOTAL HYSTERECTOMY, EXCISION OF PELVIC ENDOMETRIOSIS;  Surgeon: Marvene Staff, MD;  Location: [facility];  Service: Gynecology;  Laterality: N/A;  . TONSILLECTOMY    . TOTAL KNEE ARTHROPLASTY Left 07/03/2017   Procedure: LEFT TOTAL KNEE ARTHROPLASTY;  Surgeon: Meredith Pel, MD;  Location: [facility];  Service: Orthopedics;  Laterality: Left;    There  were no vitals filed for this visit.  Subjective Assessment - 09/27/17 1024    Subjective  No pain, has not needed Ibuprofen in 2 nights.  Hope to do light hiking this weekend. She has not done much exercising/ stretching this week,    Currently in Pain?  No/denies         [facility] Adult PT Treatment/Exercise - 09/27/17 0001      Knee/Hip Exercises: Aerobic   Stationary Bike  L3 for 6 min warm up       Knee/Hip Exercises: Supine   Bridges  Strengthening;Both;1 set;10 reps    Bridges with Clamshell  Strengthening;Both;1 set;10 reps    Straight Leg Raises  Strengthening;Left;1 set;20 reps    Straight Leg Raise with External Rotation  Strengthening;Left;1 set;20 reps    Other Supine Knee/Hip Exercises  supine clam blue x 20       Knee/Hip Exercises: Sidelying   Hip ABduction  Strengthening;Left;1 set;15 reps    Other Sidelying Knee/Hip Exercises  side lift and small range knee ext for endurance       Manual Therapy   Manual Therapy  Joint mobilization;Soft tissue mobilization;Passive ROM;Taping    Joint Mobilization  GR. I-II-III flexion and extension, used mobilization belt in sitting and in supine     Soft tissue mobilization  quads, lateral, ITB     Passive ROM  knee PROM  Kinesiotex  Edema      Kinesiotix   Edema  2 fans for swelling L ant knee              PT Education - 09/27/17 1105    Education provided  Yes    Education Details  kinesiotape, swelling     Person(s) Educated  Patient    Methods  Explanation    Comprehension  Verbalized understanding       PT Short Term Goals - 09/18/17 1004      PT SHORT TERM GOAL #1   Title  Pt will be I with HEP    Status  Achieved      PT SHORT TERM GOAL #2   Title  Pt will report pain in knee improved 25% with daily activities    Status  Achieved        PT Long Term Goals - 09/18/17 1006      PT LONG TERM GOAL #1   Title  Pt will be I with HEP for knee ROM, strength and core.     Status  On-going      PT  LONG TERM GOAL #2   Title  Pt will be able to walk without noticeable limp in community, most of the time.     Baseline  min limp     Status  On-going      PT LONG TERM GOAL #3   Title  Pt will be able to maintain AROM in L knee to <5 deg extension and >115 deg flexion     Status  On-going      PT LONG TERM GOAL #4   Title  Pt will be able to walk up and down stairs in her home with confidence and no lasting knee pain.     Status  On-going      PT LONG TERM GOAL #5   Title  Pt will resume structured exercise routine for lasting benefit and continued weight loss.     Status  On-going            Plan - 09/27/17 1106    Clinical Impression Statement  Pt with min increase in knee pain today with mat level exercises.  She has not relied on OTC meds for pain relief but also has not done much activity recently.  Worked on edema today, trial of tape for mgmt.  Making progress.     PT Treatment/Interventions  ADLs/Self Care Home Management;Cryotherapy;Gait training;Therapeutic exercise;Patient/family education;Manual techniques;Passive range of motion;Vasopneumatic Device;Manual lymph drainage;Balance training;Stair training;Electrical Stimulation;Iontophoresis 4mg /ml Dexamethasone;Moist Heat;Therapeutic activities;Functional mobility training;Neuromuscular re-education;Taping    PT Next Visit Plan  ROM, strength, core, has enough exercises, did tape help     PT Home Exercise Plan  HHPT, given flexion ROM today and hip abd in sidelying , clams and lateral step ups and eccentric lowering of left quad on step, IT band stretch    Consulted and Agree with Plan of Care  Patient       Patient will benefit from skilled therapeutic intervention in order to improve the following deficits and impairments:  Abnormal gait, Decreased balance, Decreased mobility, Difficulty walking, Hypomobility, Impaired sensation, Obesity, Pain, Impaired flexibility, Increased fascial restricitons, Decreased strength,  Decreased activity tolerance, Decreased range of motion, Increased edema  Visit Diagnosis: Acute pain of left knee  Localized edema  Muscle weakness (generalized)  Difficulty in walking, not elsewhere classified     Problem List Patient Active Problem List   Diagnosis  Date Noted  . Arthritis of knee 07/03/2017  . Varicose veins of bilateral lower extremities with other complications 01/58/6825  . Acute pain of left knee 01/25/2017  . Swelling of left knee joint 01/25/2017  . S/P hysterectomy 09/21/2014  . Lapband APS July 2015 02/17/2014  . Edema-chronic pitting in legs 01/20/2014  . Morbid obesity ([place]) 08/28/2013    , 09/27/2017, 11:08 AM  [facility] [address] [place], [place], [address] Phone: [phone]   Fax:  [phone]  Name: Mary Morrison MRN: [id number] Date of Birth: November 16, 1965 Raeford Razor, PT 09/27/17 11:08 AM Phone: [phone] Fax: [phone]

## 2017-10-02 ENCOUNTER — Ambulatory Visit: Payer: 59 | Admitting: Physical Therapy

## 2017-10-02 ENCOUNTER — Encounter: Payer: Self-pay | Admitting: Physical Therapy

## 2017-10-02 DIAGNOSIS — M25562 Pain in left knee: Secondary | ICD-10-CM | POA: Diagnosis not present

## 2017-10-02 DIAGNOSIS — R262 Difficulty in walking, not elsewhere classified: Secondary | ICD-10-CM

## 2017-10-02 DIAGNOSIS — M6281 Muscle weakness (generalized): Secondary | ICD-10-CM

## 2017-10-02 DIAGNOSIS — R600 Localized edema: Secondary | ICD-10-CM

## 2017-10-02 NOTE — Therapy (Signed)
[place], [place], [address] Phone: [phone]   Fax:  [phone]  Physical Therapy Treatment  Patient Details  Name: Mary Morrison MRN: [id number] Date of Birth: 1966-07-14 Referring Provider: Dr. Marlou Sa    Encounter Date: 10/02/2017  PT End of Session - 10/02/17 0939    Visit Number  8    Number of Visits  12    Date for PT Re-Evaluation  10/09/17    PT Start Time  0936    PT Stop Time  1016    PT Time Calculation (min)  40 min    Activity Tolerance  Patient tolerated treatment well    Behavior During Therapy  [facility] for tasks assessed/performed       Past Medical History:  Diagnosis Date  . Arthritis   . Asthma    EXERCISED INDUCED 2014   . Depression   . Fibroids   . Obesity   . Seasonal allergies   . Varicose vein of leg     Past Surgical History:  Procedure Laterality Date  . BILATERAL SALPINGECTOMY Bilateral 09/21/2014   Procedure: BILATERAL SALPINGECTOMY;  Surgeon: Marvene Staff, MD;  Location: [facility];  Service: Gynecology;  Laterality: Bilateral;  . CESAREAN SECTION  1989  . COLONOSCOPY    . LAPAROSCOPIC GASTRIC BANDING N/A 02/17/2014   Procedure: LAPAROSCOPIC GASTRIC BANDING;  Surgeon: Pedro Earls, MD;  Location: [facility];  Service: General;  Laterality: N/A;  . ROBOT ASSISTED MYOMECTOMY N/A 09/21/2014   Procedure: ROBOTIC ASSISTED MYOMECTOMY;  Surgeon: Marvene Staff, MD;  Location: [facility];  Service: Gynecology;  Laterality: N/A;  . ROBOTIC ASSISTED TOTAL HYSTERECTOMY N/A 09/21/2014   Procedure: ROBOTIC ASSISTED TOTAL HYSTERECTOMY, EXCISION OF PELVIC ENDOMETRIOSIS;  Surgeon: Marvene Staff, MD;  Location: [facility];  Service: Gynecology;  Laterality: N/A;  . TONSILLECTOMY    . TOTAL KNEE ARTHROPLASTY Left 07/03/2017   Procedure: LEFT TOTAL KNEE ARTHROPLASTY;  Surgeon: Meredith Pel, MD;  Location: [facility];  Service: Orthopedics;  Laterality: Left;    There  were no vitals filed for this visit.  Subjective Assessment - 10/02/17 0938    Subjective  No pain.  Stressed with work.  Has not taken meds at all.  Did a light hike and it swelled a bit but I stopped when it was steeper.  ABout 3 miles.  I want to be able to cross my LLE over my Rt at the ankles.  My knee popped out to the side only 1 time on my trip.     Currently in Pain?  No/denies         [facility] PT Assessment - 10/02/17 0001      AROM   Left Knee Flexion  120          [facility] Adult PT Treatment/Exercise - 10/02/17 0001      Self-Care   Other Self-Care Comments   ITB anatomy, crossing legs       Knee/Hip Exercises: Stretches   Knee: Self-Stretch to increase Flexion  Left;5 reps    Knee: Self-Stretch Limitations  seated     ITB Stretch  Left;3 reps    Other Knee/Hip Stretches  supine and standing ITB stretch against wall       Knee/Hip Exercises: Aerobic   Stationary Bike  L3 for 6 min warm up       Knee/Hip Exercises: Machines for Strengthening   Cybex Leg Press  1 plate x 20 ,  2 plates x 20 , 1 plate single leg x 10       Manual Therapy   Manual therapy comments  scar tissue work     Soft tissue mobilization  IASTM, quads, lateral, ITB              PT Education - 10/02/17 1248    Education provided  Yes    Education Details  ITB stretching, plans for DC vs Renewal     Person(s) Educated  Patient    Methods  Explanation    Comprehension  Verbalized understanding;Verbal cues required;Returned demonstration       PT Short Term Goals - 10/02/17 0939      PT SHORT TERM GOAL #1   Title  Pt will be I with HEP    Status  Achieved      PT SHORT TERM GOAL #2   Title  Pt will report pain in knee improved 25% with daily activities    Status  Achieved        PT Long Term Goals - 10/02/17 0940      PT LONG TERM GOAL #1   Title  Pt will be I with HEP for knee ROM, strength and core.     Status  On-going      PT LONG TERM GOAL #2   Title  Pt will be able  to walk without noticeable limp in community, most of the time.     Status  On-going      PT LONG TERM GOAL #3   Title  Pt will be able to maintain AROM in L knee to <5 deg extension and >115 deg flexion     Status  On-going      PT LONG TERM GOAL #4   Title  Pt will be able to walk up and down stairs in her home with confidence and no lasting knee pain.     Status  Achieved      PT LONG TERM GOAL #5   Title  Pt will resume structured exercise routine for lasting benefit and continued weight loss.     Baseline  plans to join [employer], goes to the gym, has been unusually busy lately with travel     Status  On-going            Plan - 10/02/17 1251    Clinical Impression Statement  Pt able to hike this past weekend and control her symptoms.  She has some pain L lateral knee when crossing at the ankles.  Worked on mobilizing ITB and lateral hamstring but with only min improvement.  AROM in L knee flexion was 120 deg with min discomfort.     PT Treatment/Interventions  ADLs/Self Care Home Management;Cryotherapy;Gait training;Therapeutic exercise;Patient/family education;Manual techniques;Passive range of motion;Vasopneumatic Device;Manual lymph drainage;Balance training;Stair training;Electrical Stimulation;Iontophoresis 4mg /ml Dexamethasone;Moist Heat;Therapeutic activities;Functional mobility training;Neuromuscular re-education;Taping    PT Next Visit Plan  ROM, strength, core, has enough exercises, FOTO, DC vs renew     PT Home Exercise Plan  HHPT, given flexion ROM today and hip abd in sidelying , clams and lateral step ups and eccentric lowering of left quad on step, IT band stretch    Consulted and Agree with Plan of Care  Patient       Patient will benefit from skilled therapeutic intervention in order to improve the following deficits and impairments:  Abnormal gait, Decreased balance, Decreased mobility, Difficulty walking, Hypomobility, Impaired sensation, Obesity, Pain,  Impaired flexibility, Increased  fascial restricitons, Decreased strength, Decreased activity tolerance, Decreased range of motion, Increased edema  Visit Diagnosis: Acute pain of left knee  Localized edema  Muscle weakness (generalized)  Difficulty in walking, not elsewhere classified     Problem List Patient Active Problem List   Diagnosis Date Noted  . Arthritis of knee 07/03/2017  . Varicose veins of bilateral lower extremities with other complications 31/51/7616  . Acute pain of left knee 01/25/2017  . Swelling of left knee joint 01/25/2017  . S/P hysterectomy 09/21/2014  . Lapband APS July 2015 02/17/2014  . Edema-chronic pitting in legs 01/20/2014  . Morbid obesity ([place]) 08/28/2013    , 10/02/2017, 1:08 PM  [facility] [address] [place], [place], [address] Phone: [phone]   Fax:  [phone]  Name: KARYSSA AMARAL MRN: [id number] Date of Birth: March 10, 1966  Raeford Razor, PT 10/02/17 1:08 PM Phone: [phone] Fax: [phone]

## 2017-10-09 ENCOUNTER — Ambulatory Visit: Payer: 59 | Admitting: Physical Therapy

## 2017-10-09 ENCOUNTER — Encounter: Payer: Self-pay | Admitting: Physical Therapy

## 2017-10-09 DIAGNOSIS — R600 Localized edema: Secondary | ICD-10-CM

## 2017-10-09 DIAGNOSIS — M25562 Pain in left knee: Secondary | ICD-10-CM | POA: Diagnosis not present

## 2017-10-09 DIAGNOSIS — R262 Difficulty in walking, not elsewhere classified: Secondary | ICD-10-CM

## 2017-10-09 DIAGNOSIS — M6281 Muscle weakness (generalized): Secondary | ICD-10-CM

## 2017-10-09 NOTE — Therapy (Signed)
[place], [place], [address] Phone: [phone]   Fax:  [phone]  Physical Therapy Treatment/recertification  Patient Details  Name: Mary Morrison MRN: [id number] Date of Birth: [birth year] Referring Provider: Dr. Marlou Sa    Encounter Date: 10/09/2017  PT End of Session - 10/09/17 0946    Visit Number  9    Number of Visits  14    Date for PT Re-Evaluation  11/06/17 original 10-09-17    PT Start Time  0936    PT Stop Time  1015    PT Time Calculation (min)  39 min    Activity Tolerance  Patient tolerated treatment well    Behavior During Therapy  [facility] for tasks assessed/performed       Past Medical History:  Diagnosis Date  . Arthritis   . Asthma    EXERCISED INDUCED 2014   . Depression   . Fibroids   . Obesity   . Seasonal allergies   . Varicose vein of leg     Past Surgical History:  Procedure Laterality Date  . BILATERAL SALPINGECTOMY Bilateral 09/21/2014   Procedure: BILATERAL SALPINGECTOMY;  Surgeon: Marvene Staff, MD;  Location: [facility];  Service: Gynecology;  Laterality: Bilateral;  . CESAREAN SECTION  1989  . COLONOSCOPY    . LAPAROSCOPIC GASTRIC BANDING N/A 02/17/2014   Procedure: LAPAROSCOPIC GASTRIC BANDING;  Surgeon: Pedro Earls, MD;  Location: [facility];  Service: General;  Laterality: N/A;  . ROBOT ASSISTED MYOMECTOMY N/A 09/21/2014   Procedure: ROBOTIC ASSISTED MYOMECTOMY;  Surgeon: Marvene Staff, MD;  Location: [facility];  Service: Gynecology;  Laterality: N/A;  . ROBOTIC ASSISTED TOTAL HYSTERECTOMY N/A 09/21/2014   Procedure: ROBOTIC ASSISTED TOTAL HYSTERECTOMY, EXCISION OF PELVIC ENDOMETRIOSIS;  Surgeon: Marvene Staff, MD;  Location: [facility];  Service: Gynecology;  Laterality: N/A;  . TONSILLECTOMY    . TOTAL KNEE ARTHROPLASTY Left 07/03/2017   Procedure: LEFT TOTAL KNEE ARTHROPLASTY;  Surgeon: Meredith Pel, MD;  Location: [facility];  Service:  Orthopedics;  Laterality: Left;    There were no vitals filed for this visit.  Subjective Assessment - 10/09/17 0940    Subjective  No pain right now.  I did a lot of walking over the past weekend over Home.  I did great. a lot of walking         [facility] PT Assessment - 10/09/17 0941      Observation/Other Assessments   Focus on Therapeutic Outcomes (FOTO)   Foto Intake 51% limitation 43% predicted 40%      Hopping   Comments  single limb broad jump. Right 10 inches,  Left 4 inches      AROM   Right Knee Extension  -3    Right Knee Flexion  125 129 PROM    Left Knee Extension  2    Left Knee Flexion  122 125 PROM      Strength   Right Hip Flexion  4/5    Right Hip Extension  4/5    Right Hip ABduction  4-/5    Left Hip Flexion  4/5    Left Hip Extension  4-/5    Left Hip ABduction  3/5    Right Knee Flexion  4+/5    Right Knee Extension  5/5    Left Knee Flexion  4+/5    Left Knee Extension  5/5  [facility] Adult PT Treatment/Exercise - 10/09/17 0936      Knee/Hip Exercises: Stretches   Knee: Self-Stretch to increase Flexion  Left;5 reps    Knee: Self-Stretch Limitations  seated       Knee/Hip Exercises: Aerobic   Stationary Bike  L3 for 6 min warm up       Knee/Hip Exercises: Standing   Other Standing Knee Exercises  standing goblet squat x 20 with 15 lb KB  and single limb sit to stand with raised seat Ther ex pad to elevate on mat x 20 Left and right SLS      Knee/Hip Exercises: Supine   Quad Sets  20 reps with ankle elevated to maximize quad set excursion    Constance Haw  Strengthening;Both;1 set;10 reps    Straight Leg Raises  Strengthening;Left;1 set;20 reps             PT Education - 10/09/17 1038    Education provided  Yes    Education Details  single limb broad jump and goblet squats and single limb sit to stand with elevated seat    Person(s) Educated  Patient    Methods  Explanation;Demonstration    Comprehension  Verbalized  understanding;Returned demonstration       PT Short Term Goals - 10/02/17 0939      PT SHORT TERM GOAL #1   Title  Pt will be I with HEP    Status  Achieved      PT SHORT TERM GOAL #2   Title  Pt will report pain in knee improved 25% with daily activities    Status  Achieved        PT Long Term Goals - 10/09/17 0941      PT LONG TERM GOAL #1   Title  Pt will be I with HEP for knee ROM, strength and core.     Time  8    Period  Weeks    Status  On-going      PT LONG TERM GOAL #2   Title  Pt will be able to walk without noticeable limp in community, most of the time.     Baseline  min limp when fatigued    Time  8    Period  Weeks    Status  On-going      PT LONG TERM GOAL #3   Title  Pt will be able to maintain AROM in L knee to <5 deg extension and >115 deg flexion     Baseline  2 -122    Time  8    Period  Weeks    Status  Achieved      PT LONG TERM GOAL #4   Title  Pt will be able to walk up and down stairs in her home with confidence and no lasting knee pain.     Time  8    Period  Weeks    Status  Achieved      PT LONG TERM GOAL #5   Title  Pt will resume structured exercise routine for lasting benefit and continued weight loss.     Baseline  plans to join [employer] in April , goes to the gym, has been unusually busy lately with travel     Time  8    Period  Weeks    Status  On-going      PT LONG TERM GOAL #6   Title  Pt will have symmetrical single limb hopping distance  Baseline  right side 10 inches, and left is 4 inches with UE support at end of jump    Time  12    Period  Weeks    Status  New    Target Date  11/06/17      PT LONG TERM GOAL #7   Title  Pt will be able to accomodate yoga poses for cat /cow and kneeling poses with minimal pain and restriction    Baseline  Pt unable to bear weight on left knee in positions without pain presently    Time  12    Period  Weeks    Status  New    Target Date  11/06/17      PT LONG TERM GOAL #8    Title  "FOTO will improve from 43%    to 40% limitation   indicating improved functional mobility.     Time  12    Period  Weeks    Status  New    Target Date  11/06/17            Plan - 10/09/17 1023    Clinical Impression Statement  Pt has accomplished most goals but pt does not have complete left knee extension.  Pt AROM of knee is 2ext- 122. Pt has made improvemente in MMT. See Flowsheet from 3/5 and 3-/5 in hip/knee strength to at least 4-/5 to 4/5 but would like maximize MMT in order to return to yoga classes.  At present pt has pain when bearing wt in quadriped and is unable to perform cat/cow and quadriped kneeling. childs pose.  Pt has asymmetrical broad jump with right side 10 inches and left 4 inches.  Pt will benefit from a 4 week extension in order to maximize strength and function and prepare for extracurricular and community fitness.  Pt FOTO at 43% limitation and goal is 40%.  Pt will benefit from 1 x  week for 4 weeks to maximize goals.    Rehab Potential  Excellent    PT Frequency  1x / week original  2 x    PT Duration  4 weeks original 8 weeks    PT Treatment/Interventions  ADLs/Self Care Home Management;Cryotherapy;Gait training;Therapeutic exercise;Patient/family education;Manual techniques;Passive range of motion;Vasopneumatic Device;Manual lymph drainage;Balance training;Stair training;Electrical Stimulation;Iontophoresis 4mg /ml Dexamethasone;Moist Heat;Therapeutic activities;Functional mobility training;Neuromuscular re-education;Taping    PT Next Visit Plan  ROM, strength, core, has enough exercises, Has 4 additional appt to maximize goals.  hip strength, jumping. and being able to get into yoga poses without knee pain    PT Home Exercise Plan  HHPT, given flexion ROM today and hip abd in sidelying , clams and lateral step ups and eccentric lowering of left quad on step, IT band stretch    Consulted and Agree with Plan of Care  Patient       Patient will benefit  from skilled therapeutic intervention in order to improve the following deficits and impairments:  Abnormal gait, Decreased balance, Decreased mobility, Difficulty walking, Hypomobility, Impaired sensation, Obesity, Pain, Impaired flexibility, Increased fascial restricitons, Decreased strength, Decreased activity tolerance, Decreased range of motion, Increased edema  Visit Diagnosis: Acute pain of left knee  Localized edema  Muscle weakness (generalized)  Difficulty in walking, not elsewhere classified     Problem List Patient Active Problem List   Diagnosis Date Noted  . Arthritis of knee 07/03/2017  . Varicose veins of bilateral lower extremities with other complications 16/04/9603  . Acute pain of left knee 01/25/2017  .  Swelling of left knee joint 01/25/2017  . S/P hysterectomy 09/21/2014  . Lapband APS July 2015 02/17/2014  . Edema-chronic pitting in legs 01/20/2014  . Morbid obesity ([place]) 08/28/2013   Voncille Lo, PT Certified Exercise Expert for the Aging Adult  10/09/17 10:39 AM Phone: [phone] Fax: [facility] [address] [place], [place], [address] Phone: [phone]   Fax:  [phone]  Name: Mary Morrison MRN: [id number] Date of Birth: [birth year]

## 2017-10-11 ENCOUNTER — Ambulatory Visit: Payer: 59 | Admitting: Physical Therapy

## 2017-10-11 ENCOUNTER — Encounter: Payer: Self-pay | Admitting: Physical Therapy

## 2017-10-11 DIAGNOSIS — R600 Localized edema: Secondary | ICD-10-CM

## 2017-10-11 DIAGNOSIS — M6281 Muscle weakness (generalized): Secondary | ICD-10-CM

## 2017-10-11 DIAGNOSIS — R262 Difficulty in walking, not elsewhere classified: Secondary | ICD-10-CM

## 2017-10-11 DIAGNOSIS — M25562 Pain in left knee: Secondary | ICD-10-CM

## 2017-10-11 NOTE — Therapy (Signed)
[place], [place], [address] Phone: [phone]   Fax:  [phone]  Physical Therapy Treatment  Patient Details  Name: Mary Morrison MRN: [id number] Date of Birth: 1965-08-16 Referring Provider: Dr. Marlou Sa    Encounter Date: 10/11/2017  PT End of Session - 10/11/17 1512    Visit Number  10    Number of Visits  14    Date for PT Re-Evaluation  11/06/17    PT Start Time  1330    PT Stop Time  1430    PT Time Calculation (min)  60 min    Activity Tolerance  Patient tolerated treatment well    Behavior During Therapy  [facility] for tasks assessed/performed       Past Medical History:  Diagnosis Date  . Arthritis   . Asthma    EXERCISED INDUCED 2014   . Depression   . Fibroids   . Obesity   . Seasonal allergies   . Varicose vein of leg     Past Surgical History:  Procedure Laterality Date  . BILATERAL SALPINGECTOMY Bilateral 09/21/2014   Procedure: BILATERAL SALPINGECTOMY;  Surgeon: Marvene Staff, MD;  Location: [facility];  Service: Gynecology;  Laterality: Bilateral;  . CESAREAN SECTION  1989  . COLONOSCOPY    . LAPAROSCOPIC GASTRIC BANDING N/A 02/17/2014   Procedure: LAPAROSCOPIC GASTRIC BANDING;  Surgeon: Pedro Earls, MD;  Location: [facility];  Service: General;  Laterality: N/A;  . ROBOT ASSISTED MYOMECTOMY N/A 09/21/2014   Procedure: ROBOTIC ASSISTED MYOMECTOMY;  Surgeon: Marvene Staff, MD;  Location: [facility];  Service: Gynecology;  Laterality: N/A;  . ROBOTIC ASSISTED TOTAL HYSTERECTOMY N/A 09/21/2014   Procedure: ROBOTIC ASSISTED TOTAL HYSTERECTOMY, EXCISION OF PELVIC ENDOMETRIOSIS;  Surgeon: Marvene Staff, MD;  Location: [facility];  Service: Gynecology;  Laterality: N/A;  . TONSILLECTOMY    . TOTAL KNEE ARTHROPLASTY Left 07/03/2017   Procedure: LEFT TOTAL KNEE ARTHROPLASTY;  Surgeon: Meredith Pel, MD;  Location: [facility];  Service: Orthopedics;  Laterality: Left;     There were no vitals filed for this visit.  Subjective Assessment - 10/11/17 1335    Subjective  No pain.  Working at home today so I am sitff I have been sitting alot.     Currently in Pain?  No/denies         [facility] Adult PT Treatment/Exercise - 10/11/17 0001      Knee/Hip Exercises: Stretches   Active Hamstring Stretch  Left;3 reps contract relax    Knee: Self-Stretch to increase Flexion  Left;5 reps    Gastroc Stretch  Both;3 reps;30 seconds      Knee/Hip Exercises: Aerobic   Elliptical  L1 resist.  and level 10 ramp for 5 min       Knee/Hip Exercises: Standing   Forward Lunges  Both;1 set;10 reps    Side Lunges  Both;1 set;10 reps    Functional Squat  1 set;10 reps    Other Standing Knee Exercises  sumo squat x 10 lbs x 15       Vasopneumatic   Number Minutes Vasopneumatic   15 minutes    Vasopnuematic Location   Knee    Vasopneumatic Pressure  Low    Vasopneumatic Temperature   32 deg       Manual Therapy   Manual Therapy  Edema management    Joint Mobilization  GR. I -II flex and ext     Soft tissue mobilization  IASTM L knee, quads, lateral and medial , peripatellar     Passive ROM  L knee                PT Short Term Goals - 10/02/17 0939      PT SHORT TERM GOAL #1   Title  Pt will be I with HEP    Status  Achieved      PT SHORT TERM GOAL #2   Title  Pt will report pain in knee improved 25% with daily activities    Status  Achieved        PT Long Term Goals - 10/09/17 0941      PT LONG TERM GOAL #1   Title  Pt will be I with HEP for knee ROM, strength and core.     Time  8    Period  Weeks    Status  On-going      PT LONG TERM GOAL #2   Title  Pt will be able to walk without noticeable limp in community, most of the time.     Baseline  min limp when fatigued    Time  8    Period  Weeks    Status  On-going      PT LONG TERM GOAL #3   Title  Pt will be able to maintain AROM in L knee to <5 deg extension and >115 deg flexion      Baseline  2 -122    Time  8    Period  Weeks    Status  Achieved      PT LONG TERM GOAL #4   Title  Pt will be able to walk up and down stairs in her home with confidence and no lasting knee pain.     Time  8    Period  Weeks    Status  Achieved      PT LONG TERM GOAL #5   Title  Pt will resume structured exercise routine for lasting benefit and continued weight loss.     Baseline  plans to join [employer] in April , goes to the gym, has been unusually busy lately with travel     Time  8    Period  Weeks    Status  On-going      PT LONG TERM GOAL #6   Title  Pt will have symmetrical single limb hopping distance    Baseline  right side 10 inches, and left is 4 inches with UE support at end of jump    Time  12    Period  Weeks    Status  New    Target Date  11/06/17      PT LONG TERM GOAL #7   Title  Pt will be able to accomodate yoga poses for cat /cow and kneeling poses with minimal pain and restriction    Baseline  Pt unable to bear weight on left knee in positions without pain presently    Time  12    Period  Weeks    Status  New    Target Date  11/06/17      PT LONG TERM GOAL #8   Title  "FOTO will improve from 43%    to 40% limitation   indicating improved functional mobility.     Time  12    Period  Weeks    Status  New    Target Date  11/06/17  Plan - 10/11/17 1512    Clinical Impression Statement  Worked on higher level hip and knee strength/corrdination today without increasing knee pain.  She has tightness in post knee with flexion.  Utilized vaso for edema.     PT Treatment/Interventions  ADLs/Self Care Home Management;Cryotherapy;Gait training;Therapeutic exercise;Patient/family education;Manual techniques;Passive range of motion;Vasopneumatic Device;Manual lymph drainage;Balance training;Stair training;Electrical Stimulation;Iontophoresis 4mg /ml Dexamethasone;Moist Heat;Therapeutic activities;Functional mobility training;Neuromuscular  re-education;Taping    PT Next Visit Plan  try Reformer/tower ROM, strength, core, has enough exercises, Has 4 additional appt to maximize goals.  hip strength, jumping. and being able to get into yoga poses without knee pain    PT Home Exercise Plan  HHPT, given flexion ROM today and hip abd in sidelying , clams and lateral step ups and eccentric lowering of left quad on step, IT band stretch    Consulted and Agree with Plan of Care  Patient       Patient will benefit from skilled therapeutic intervention in order to improve the following deficits and impairments:  Abnormal gait, Decreased balance, Decreased mobility, Difficulty walking, Hypomobility, Impaired sensation, Obesity, Pain, Impaired flexibility, Increased fascial restricitons, Decreased strength, Decreased activity tolerance, Decreased range of motion, Increased edema  Visit Diagnosis: Localized edema  Acute pain of left knee  Muscle weakness (generalized)  Difficulty in walking, not elsewhere classified     Problem List Patient Active Problem List   Diagnosis Date Noted  . Arthritis of knee 07/03/2017  . Varicose veins of bilateral lower extremities with other complications 57/84/6962  . Acute pain of left knee 01/25/2017  . Swelling of left knee joint 01/25/2017  . S/P hysterectomy 09/21/2014  . Lapband APS July 2015 02/17/2014  . Edema-chronic pitting in legs 01/20/2014  . Morbid obesity ([place]) 08/28/2013    , 10/11/2017, 3:17 PM  [facility] [address] [place], [place], [address] Phone: [phone]   Fax:  [phone]  Name: GEAN LAURSEN MRN: [id number] Date of Birth: 11/28/1965  Raeford Razor, PT 10/11/17 3:17 PM Phone: [phone] Fax: [phone]

## 2017-10-16 ENCOUNTER — Encounter: Payer: Self-pay | Admitting: Physical Therapy

## 2017-10-16 ENCOUNTER — Ambulatory Visit: Payer: 59 | Admitting: Physical Therapy

## 2017-10-16 DIAGNOSIS — M25562 Pain in left knee: Secondary | ICD-10-CM | POA: Diagnosis not present

## 2017-10-16 DIAGNOSIS — R262 Difficulty in walking, not elsewhere classified: Secondary | ICD-10-CM

## 2017-10-16 DIAGNOSIS — R600 Localized edema: Secondary | ICD-10-CM

## 2017-10-16 DIAGNOSIS — M6281 Muscle weakness (generalized): Secondary | ICD-10-CM

## 2017-10-16 NOTE — Therapy (Signed)
[place], [place], [address] Phone: [phone]   Fax:  [phone]  Physical Therapy Treatment  Patient Details  Name: Mary Morrison MRN: [id number] Date of Birth: 02-16-1966 Referring Provider: Dr. Marlou Sa    Encounter Date: 10/16/2017  PT End of Session - 10/16/17 0851    Visit Number  11    Number of Visits  14    Date for PT Re-Evaluation  11/06/17    PT Start Time  0850    PT Stop Time  0930    PT Time Calculation (min)  40 min       Past Medical History:  Diagnosis Date  . Arthritis   . Asthma    EXERCISED INDUCED 2014   . Depression   . Fibroids   . Obesity   . Seasonal allergies   . Varicose vein of leg     Past Surgical History:  Procedure Laterality Date  . BILATERAL SALPINGECTOMY Bilateral 09/21/2014   Procedure: BILATERAL SALPINGECTOMY;  Surgeon: Marvene Staff, MD;  Location: [facility];  Service: Gynecology;  Laterality: Bilateral;  . CESAREAN SECTION  1989  . COLONOSCOPY    . LAPAROSCOPIC GASTRIC BANDING N/A 02/17/2014   Procedure: LAPAROSCOPIC GASTRIC BANDING;  Surgeon: Pedro Earls, MD;  Location: [facility];  Service: General;  Laterality: N/A;  . ROBOT ASSISTED MYOMECTOMY N/A 09/21/2014   Procedure: ROBOTIC ASSISTED MYOMECTOMY;  Surgeon: Marvene Staff, MD;  Location: [facility];  Service: Gynecology;  Laterality: N/A;  . ROBOTIC ASSISTED TOTAL HYSTERECTOMY N/A 09/21/2014   Procedure: ROBOTIC ASSISTED TOTAL HYSTERECTOMY, EXCISION OF PELVIC ENDOMETRIOSIS;  Surgeon: Marvene Staff, MD;  Location: [facility];  Service: Gynecology;  Laterality: N/A;  . TONSILLECTOMY    . TOTAL KNEE ARTHROPLASTY Left 07/03/2017   Procedure: LEFT TOTAL KNEE ARTHROPLASTY;  Surgeon: Meredith Pel, MD;  Location: [facility];  Service: Orthopedics;  Laterality: Left;    There were no vitals filed for this visit.  Subjective Assessment - 10/16/17 0853    Subjective  I feel tight when I  want to bend it at the end range    Pertinent History  obesity, hysterectomy, LE varicose veins     Currently in Pain?  Yes at end range bend of knee    Pain Score  2     Pain Location  Knee    Pain Orientation  Left;Anterior    Pain Type  Surgical pain    Pain Onset  More than a month ago    Pain Frequency  Intermittent         [facility] PT Assessment - 10/16/17 0935      AROM   Right Knee Extension  -3    Right Knee Flexion  125    Left Knee Extension  0    Left Knee Flexion  123 PROM 126            No data recorded       [facility] Adult PT Treatment/Exercise - 10/16/17 0854      Knee/Hip Exercises: Stretches   Knee: Self-Stretch to increase Flexion  Left;5 reps    Gastroc Stretch  Both;3 reps;30 seconds on leg press machine,        Knee/Hip Exercises: Aerobic   Recumbent Bike  5 minutes  warm up      Knee/Hip Exercises: Machines for Strengthening   Cybex Leg Press  5 plates bil LE 15 x 2, single lim  Other Machine  leg press, bil heel raises 2 x 15 60 lb      Knee/Hip Exercises: Standing   Functional Squat  1 set;10 reps    SLS  Pt with standing bent knee Single limb quad stretch with chair in front for balance 15 x 2 with opposite leg on mat    Other Standing Knee Exercises  walking hamstring stretch 2 x 20 feet.    Other Standing Knee Exercises  sumo squat x 10 lbs x 15  lunge with orange medicine ball 2 x 20 feet      Knee/Hip Exercises: Prone   Other Prone Exercises  Cat cow technique with ther ex under knee for prep for yoga.              PT Education - 10/16/17 0912    Education provided  Yes    Education Details  added advanced knee exercises and instructed on gym equipment    Person(s) Educated  Patient    Methods  Explanation;Demonstration;Tactile cues;Verbal cues;Handout    Comprehension  Verbalized understanding;Returned demonstration       PT Short Term Goals - 10/02/17 0939      PT SHORT TERM GOAL #1   Title  Pt will be I with HEP     Status  Achieved      PT SHORT TERM GOAL #2   Title  Pt will report pain in knee improved 25% with daily activities    Status  Achieved        PT Long Term Goals - 10/16/17 2876      PT LONG TERM GOAL #1   Title  Pt will be I with HEP for knee ROM, strength and core.     Baseline  all advance HEP given     Time  8    Period  Weeks    Status  On-going      PT LONG TERM GOAL #2   Title  Pt will be able to walk without noticeable limp in community, most of the time.     Baseline  no limip    Time  8    Period  Weeks    Status  Achieved      PT LONG TERM GOAL #3   Title  Pt will be able to maintain AROM in L knee to <5 deg extension and >115 deg flexion     Baseline  0-123 today    Time  8    Period  Weeks    Status  Achieved      PT LONG TERM GOAL #4   Title  Pt will be able to walk up and down stairs in her home with confidence and no lasting knee pain.     Time  8    Period  Weeks    Status  Achieved      PT LONG TERM GOAL #5   Title  Pt will resume structured exercise routine for lasting benefit and continued weight loss.     Baseline  plans to join [employer] in April , goes to the gym, has been unusually busy lately with travel     Period  Weeks    Status  Partially Met      PT LONG TERM GOAL #6   Title  Pt will have symmetrical single limb hopping distance    Baseline  right side 10 inches, and left is 4 inches with UE support at end of  jump one week ago    Time  12    Period  Weeks    Status  Unable to assess      PT LONG TERM GOAL #7   Title  Pt will be able to accomodate yoga poses for cat /cow and kneeling poses with minimal pain and restriction    Baseline  Pt unable to bear weight on left knee in positions without pain presently working on technique    Time  12    Period  Weeks    Status  On-going      PT LONG TERM GOAL #8   Title  "FOTO will improve from 43%    to 40% limitation   indicating improved functional mobility.     Time  12     Period  Weeks    Status  Unable to assess            Plan - 10/16/17 0936    Clinical Impression Statement  Worked on higher level hip and knee strength on machines.  Pt left knee at 0 knee ext and 123 AROM, 126 PROM.  Pt is ready for DC after next visit to reinforce HEP    Rehab Potential  Excellent    PT Frequency  1x / week    PT Duration  4 weeks    PT Treatment/Interventions  ADLs/Self Care Home Management;Cryotherapy;Gait training;Therapeutic exercise;Patient/family education;Manual techniques;Passive range of motion;Vasopneumatic Device;Manual lymph drainage;Balance training;Stair training;Electrical Stimulation;Iontophoresis 83m/ml Dexamethasone;Moist Heat;Therapeutic activities;Functional mobility training;Neuromuscular re-education;Taping    PT Next Visit Plan  PT has 1 more appt next week.  Do FOTO and Goals and DC    PT Home Exercise Plan  HHPT, given flexion ROM today and hip abd in sidelying , clams and lateral step ups and eccentric lowering of left quad on step, IT band stretch    Consulted and Agree with Plan of Care  Patient       Patient will benefit from skilled therapeutic intervention in order to improve the following deficits and impairments:  Abnormal gait, Decreased balance, Decreased mobility, Difficulty walking, Hypomobility, Impaired sensation, Obesity, Pain, Impaired flexibility, Increased fascial restricitons, Decreased strength, Decreased activity tolerance, Decreased range of motion, Increased edema  Visit Diagnosis: Localized edema  Acute pain of left knee  Muscle weakness (generalized)  Difficulty in walking, not elsewhere classified     Problem List Patient Active Problem List   Diagnosis Date Noted  . Arthritis of knee 07/03/2017  . Varicose veins of bilateral lower extremities with other complications 000/76/2263 . Acute pain of left knee 01/25/2017  . Swelling of left knee joint 01/25/2017  . S/P hysterectomy 09/21/2014  . Lapband APS  July 2015 02/17/2014  . Edema-chronic pitting in legs 01/20/2014  . [place] 08/28/2013    LVoncille Lo PT Certified Exercise Expert for the Aging Adult  10/16/17 9:41 AM Phone: [phone]: 3[facility] [address]Phone: [phone]  Fax:  [phone] Name: AMILES BORKOWSKIMRN: [id number] of Birth: [birth year]

## 2017-10-16 NOTE — Patient Instructions (Signed)
     Voncille Lo, PT Certified Exercise Expert for the Aging Adult  10/16/17 9:12 AM Phone: [phone] Fax: [phone]

## 2017-10-18 ENCOUNTER — Encounter: Payer: 59 | Admitting: Physical Therapy

## 2017-10-23 ENCOUNTER — Ambulatory Visit: Payer: 59 | Attending: Orthopedic Surgery | Admitting: Physical Therapy

## 2017-10-23 ENCOUNTER — Encounter: Payer: Self-pay | Admitting: Physical Therapy

## 2017-10-23 DIAGNOSIS — M6281 Muscle weakness (generalized): Secondary | ICD-10-CM | POA: Diagnosis present

## 2017-10-23 DIAGNOSIS — R600 Localized edema: Secondary | ICD-10-CM

## 2017-10-23 DIAGNOSIS — M25562 Pain in left knee: Secondary | ICD-10-CM | POA: Diagnosis not present

## 2017-10-23 DIAGNOSIS — R262 Difficulty in walking, not elsewhere classified: Secondary | ICD-10-CM

## 2017-10-23 NOTE — Therapy (Signed)
[place], [place], [address] Phone: [phone]   Fax:  [phone]  Physical Therapy Treatment/Discharge Note  Patient Details  Name: Mary Morrison MRN: [id number] Date of Birth: 1965-07-29 Referring Provider: Dr. Marlou Sa    Encounter Date: 10/23/2017  PT End of Session - 10/23/17 0856    Visit Number  12    Number of Visits  14    Date for PT Re-Evaluation  11/06/17    PT Start Time  0846    PT Stop Time  0925    PT Time Calculation (min)  39 min    Activity Tolerance  Patient tolerated treatment well    Behavior During Therapy  [facility] for tasks assessed/performed       Past Medical History:  Diagnosis Date  . Arthritis   . Asthma    EXERCISED INDUCED 2014   . Depression   . Fibroids   . Obesity   . Seasonal allergies   . Varicose vein of leg     Past Surgical History:  Procedure Laterality Date  . BILATERAL SALPINGECTOMY Bilateral 09/21/2014   Procedure: BILATERAL SALPINGECTOMY;  Surgeon: Marvene Staff, MD;  Location: [facility];  Service: Gynecology;  Laterality: Bilateral;  . CESAREAN SECTION  1989  . COLONOSCOPY    . LAPAROSCOPIC GASTRIC BANDING N/A 02/17/2014   Procedure: LAPAROSCOPIC GASTRIC BANDING;  Surgeon: Pedro Earls, MD;  Location: [facility];  Service: General;  Laterality: N/A;  . ROBOT ASSISTED MYOMECTOMY N/A 09/21/2014   Procedure: ROBOTIC ASSISTED MYOMECTOMY;  Surgeon: Marvene Staff, MD;  Location: [facility];  Service: Gynecology;  Laterality: N/A;  . ROBOTIC ASSISTED TOTAL HYSTERECTOMY N/A 09/21/2014   Procedure: ROBOTIC ASSISTED TOTAL HYSTERECTOMY, EXCISION OF PELVIC ENDOMETRIOSIS;  Surgeon: Marvene Staff, MD;  Location: [facility];  Service: Gynecology;  Laterality: N/A;  . TONSILLECTOMY    . TOTAL KNEE ARTHROPLASTY Left 07/03/2017   Procedure: LEFT TOTAL KNEE ARTHROPLASTY;  Surgeon: Meredith Pel, MD;  Location: [facility];  Service: Orthopedics;  Laterality:  Left;    There were no vitals filed for this visit.  Subjective Assessment - 10/23/17 0850    Subjective  I am ready to be set free.  I am [facility] to go to [employer] and it [place] to my house    Pertinent History  obesity, hysterectomy, LE varicose veins     Limitations  Lifting;Standing;Walking;House hold activities;Other (comment);Sitting    How long can you sit comfortably?  unlimited  knows she should get up and move every 1 hour and 90 minutes    How long can you stand comfortably?  1hour    How long can you walk comfortably?  I have hiked about 3 miles in an hour    Diagnostic tests  XR     Patient Stated Goals  Walk her dogs and hike with friends, going on a a cruise in 2 weeks     Currently in Pain?  No/denies does feel tightness doesnt take meds now    Pain Location  Knee    Pain Orientation  Left         [facility] PT Assessment - 10/23/17 0853      Observation/Other Assessments   Focus on Therapeutic Outcomes (FOTO)   FOTO intake 72%, limitation 28% predicted 40%      Hopping   Comments  single limb broad jump. Right 11inches,  Left 6.5 inches improved but not symmetrical  AROM   Right Knee Extension  -3    Right Knee Flexion  125    Left Knee Extension  0    Left Knee Flexion  126      Strength   Right Hip Flexion  4+/5    Right Hip Extension  4+/5    Right Hip ABduction  4+/5    Left Hip Flexion  4+/5    Left Hip Extension  4/5    Left Hip ABduction  4/5    Right Knee Flexion  5/5    Right Knee Extension  5/5    Left Knee Flexion  5/5    Left Knee Extension  5/5                   [facility] Adult PT Treatment/Exercise - 10/23/17 0853      Ambulation/Gait   Ambulation Distance (Feet)  300 Feet    Gait Pattern  Step-through pattern even stride length    Ambulation Surface  Level    Gait velocity  4.50 ft/sec       Self-Care   Self-Care  Other Self-Care Comments    Other Self-Care Comments   education on community wellness  opportunitiies answered questions concernng HEP      Knee/Hip Exercises: Stretches   Knee: Self-Stretch to increase Flexion  Left;5 reps use of towel to encourage flexion      Knee/Hip Exercises: Aerobic   Recumbent Bike  6 minutes  level 3  WARM UP      Knee/Hip Exercises: Machines for Strengthening   Cybex Leg Press  5 plates bil LE 15 x 2, single lim    Other Machine  leg press, bil heel raises 2 x 15 60 lb      Manual Therapy   Manual Therapy  Soft tissue mobilization    Soft tissue mobilization  soft tissue retrograde massage for left knee             PT Education - 10/23/17 1027    Education provided  Yes    Education Details  reviewed HEP and communitiy wellness opportunities    Person(s) Educated  Patient    Methods  Explanation;Demonstration    Comprehension  Verbalized understanding;Returned demonstration       PT Short Term Goals - 10/02/17 0939      PT SHORT TERM GOAL #1   Title  Pt will be I with HEP    Status  Achieved      PT SHORT TERM GOAL #2   Title  Pt will report pain in knee improved 25% with daily activities    Status  Achieved        PT Long Term Goals - 10/23/17 0914      PT LONG TERM GOAL #1   Title  Pt will be I with HEP for knee ROM, strength and core.     Baseline  all advance HEP given  and demonstrated    Time  8    Period  Weeks    Status  Achieved      PT LONG TERM GOAL #2   Title  Pt will be able to walk without noticeable limp in community, most of the time.     Baseline  Pt able to walk without limp    Time  8    Period  Weeks    Status  Achieved      PT LONG TERM GOAL #3   Title  Pt will be able to maintain AROM in L knee to <5 deg extension and >115 deg flexion     Baseline  0-126 at max tension    Time  8    Period  Weeks    Status  Achieved      PT LONG TERM GOAL #4   Title  Pt will be able to walk up and down stairs in her home with confidence and no lasting knee pain.     Baseline  Able to walk up and   down steps holding laundry    Time  8    Period  Weeks    Status  Achieved      PT LONG TERM GOAL #5   Title  Pt will resume structured exercise routine for lasting benefit and continued weight loss.     Baseline  plans to join [employer] [facility] [employer] in April , swim at [employer], will go after DC from PT today    Time  8    Period  Weeks    Status  Achieved      PT LONG TERM GOAL #6   Title  Pt will have symmetrical single limb hopping distance    Baseline  right side 11 and left 6.5 inches improved but now symmetrical    Time  12    Period  Weeks    Status  Partially Met      PT LONG TERM GOAL #7   Title  Pt will be able to accomodate yoga poses for cat /cow and kneeling poses with minimal pain and restriction    Baseline  Pt able to do cat/cow with pillow under left knee,  Pressure annoying but able to do    Time  12    Period  Weeks    Status  Achieved      PT LONG TERM GOAL #8   Title  "FOTO will improve from 43%    to 40% limitation   indicating improved functional mobility.     Baseline  FOTO limitation 28%  predicted 40%      Time  12    Period  Weeks    Status  Achieved            Plan - 10/23/17 1029    Clinical Impression Statement  Pt has met all goals except broad jump goal which was partially met with improvement but not symmetrical.  Pt left knee at DC is knee ext 0 and flexion 126, Pt is able to walk with 4.50 ft/sec and no limp. Pt was educated in community wellness opportunities and emphasis placed on need for continued strengthening for total body health and wellness.  Pt was a delight for whom to serve and is well motiovated to continue strengthening with HEP and beyond at [employer] [facility] and aquatics    Rehab Potential  Excellent    PT Frequency  1x / week    PT Duration  4 weeks    PT Treatment/Interventions  ADLs/Self Care Home Management;Cryotherapy;Gait training;Therapeutic exercise;Patient/family education;Manual techniques;Passive range of  motion;Vasopneumatic Device;Manual lymph drainage;Balance training;Stair training;Electrical Stimulation;Iontophoresis 69m/ml Dexamethasone;Moist Heat;Therapeutic activities;Functional mobility training;Neuromuscular re-education;Taping    PT Next Visit Plan  DC    PT Home Exercise Plan  HHPT, given flexion ROM today and hip abd in sidelying , clams and lateral step ups and eccentric lowering of left quad on step, IT band stretch    Consulted and Agree with Plan of Care  Patient       Patient will benefit from skilled therapeutic intervention in order to improve the following deficits and impairments:  Abnormal gait, Decreased balance, Decreased mobility, Difficulty walking, Hypomobility, Impaired sensation, Obesity, Pain, Impaired flexibility, Increased fascial restricitons, Decreased strength, Decreased activity tolerance, Decreased range of motion, Increased edema  Visit Diagnosis: Localized edema  Acute pain of left knee  Muscle weakness (generalized)  Difficulty in walking, not elsewhere classified     Problem List Patient Active Problem List   Diagnosis Date Noted  . Arthritis of knee 07/03/2017  . Varicose veins of bilateral lower extremities with other complications 82/51/8984  . Acute pain of left knee 01/25/2017  . Swelling of left knee joint 01/25/2017  . S/P hysterectomy 09/21/2014  . Lapband APS July 2015 02/17/2014  . Edema-chronic pitting in legs 01/20/2014  . Morbid obesity ([place]) 08/28/2013    Voncille Lo, PT Certified Exercise Expert for the Aging Adult  10/23/17 10:39 AM Phone: [phone] Fax: [facility] [address] [place], [place], [address] Phone: [phone]   Fax:  [phone]  Name: Mary Morrison MRN: [id number] Date of Birth: [birth year]   PHYSICAL THERAPY DISCHARGE SUMMARY  Visits from Start of Care: 12  Current functional level related to goals / functional  outcomes: As above   Remaining deficits: Edema, Knee flexion left ext 0, flexion 126  Education / Equipment: HEP and knowledge of community wellness opportunities  Plan: Patient agrees to discharge.  Patient goals were met. Patient is being discharged due to meeting the stated rehab goals.  ?????    And being pleased with current functional progress.  Voncille Lo, PT Certified Exercise Expert for the Aging Adult  10/23/17 10:40 AM Phone: [phone] Fax: [phone]

## 2018-04-18 DIAGNOSIS — Z23 Encounter for immunization: Secondary | ICD-10-CM | POA: Diagnosis not present

## 2018-05-22 DIAGNOSIS — Z1283 Encounter for screening for malignant neoplasm of skin: Secondary | ICD-10-CM | POA: Diagnosis not present

## 2018-05-22 DIAGNOSIS — L720 Epidermal cyst: Secondary | ICD-10-CM | POA: Diagnosis not present

## 2018-05-22 DIAGNOSIS — D225 Melanocytic nevi of trunk: Secondary | ICD-10-CM | POA: Diagnosis not present

## 2018-05-31 DIAGNOSIS — Z1231 Encounter for screening mammogram for malignant neoplasm of breast: Secondary | ICD-10-CM | POA: Diagnosis not present

## 2018-06-10 ENCOUNTER — Ambulatory Visit (INDEPENDENT_AMBULATORY_CARE_PROVIDER_SITE_OTHER): Payer: 59 | Admitting: Orthopedic Surgery

## 2018-06-10 ENCOUNTER — Encounter (INDEPENDENT_AMBULATORY_CARE_PROVIDER_SITE_OTHER): Payer: Self-pay | Admitting: Orthopedic Surgery

## 2018-06-10 DIAGNOSIS — G8929 Other chronic pain: Secondary | ICD-10-CM

## 2018-06-10 DIAGNOSIS — M25562 Pain in left knee: Secondary | ICD-10-CM

## 2018-06-10 NOTE — Progress Notes (Signed)
Office Visit Note   Patient: Mary Morrison           Date of Birth: 11-Sep-1965           MRN: [id number] Visit Date: 06/10/2018 Requested by: Harlan Stains, MD [place], [place] [address] PCP: Harlan Stains, MD  Subjective: Chief Complaint  Patient presents with  . Left Knee - Follow-up    HPI: Patient presents now about a year out left total knee replacement.  In general she is doing well.  She has some achiness at times.  She uses a heel insert because she feels like the left leg may be shorter than the right.  It is not in today.  She is doing home exercise program.  She was using a physical trainer but stopped doing that.  Not taking anything for pain.              ROS: All systems reviewed are negative as they relate to the chief complaint within the history of present illness.  Patient denies  fevers or chills.   Assessment & Plan: Visit Diagnoses:  1. Chronic pain of left knee     Plan: Impression is well-functioning left total knee replacement.  She has excellent range of motion.  I cannot really detect the pelvic tilt when she is standing.  Pedal pulses are palpable.  Range of motion is 0 to about 120.  She has good stability to varus and valgus stress at 0 30 and 90 degrees.  I am going to release her at this time.  Follow-up with me as needed.  Need for dental prophylaxis encouraged her over the next 2 years.  Follow-Up Instructions: Return if symptoms worsen or fail to improve.   Orders:  No orders of the defined types were placed in this encounter.  No orders of the defined types were placed in this encounter.     Procedures: No procedures performed   Clinical Data: No additional findings.  Objective: Vital Signs: LMP 02/10/2014   Physical Exam:   Constitutional: Patient appears well-developed HEENT:  Head: Normocephalic Eyes:EOM are normal Neck: Normal range of motion Cardiovascular: Normal rate Pulmonary/chest:  Effort normal Neurologic: Patient is alert Skin: Skin is warm Psychiatric: Patient has normal mood and affect    Ortho Exam: Ortho exam demonstrates normal gait alignment with pretty normal leg lengths based on pelvic tilt on manual testing at the iliac crest.  She has good range of motion from 0-1 25.  Good stability varus and valgus stress at 0 30 degrees with no fluid in the knee joint.  Specialty Comments:  No specialty comments available.  Imaging: No results found.   PMFS History: Patient Active Problem List   Diagnosis Date Noted  . Arthritis of knee 07/03/2017  . Varicose veins of bilateral lower extremities with other complications 85/63/1497  . Acute pain of left knee 01/25/2017  . Swelling of left knee joint 01/25/2017  . S/P hysterectomy 09/21/2014  . Lapband APS July 2015 02/17/2014  . Edema-chronic pitting in legs 01/20/2014  . Morbid obesity ([place]) 08/28/2013   Past Medical History:  Diagnosis Date  . Arthritis   . Asthma    EXERCISED INDUCED 2014   . Depression   . Fibroids   . Obesity   . Seasonal allergies   . Varicose vein of leg     Family History  Problem Relation Age of Onset  . Cancer Mother  breast/bilateral mastectomies  . Diabetes Other   . Hypertension Other   . Stroke Other   . Obesity Other     Past Surgical History:  Procedure Laterality Date  . BILATERAL SALPINGECTOMY Bilateral 09/21/2014   Procedure: BILATERAL SALPINGECTOMY;  Surgeon: Marvene Staff, MD;  Location: [facility];  Service: Gynecology;  Laterality: Bilateral;  . CESAREAN SECTION  1989  . COLONOSCOPY    . LAPAROSCOPIC GASTRIC BANDING N/A 02/17/2014   Procedure: LAPAROSCOPIC GASTRIC BANDING;  Surgeon: Pedro Earls, MD;  Location: [facility];  Service: General;  Laterality: N/A;  . ROBOT ASSISTED MYOMECTOMY N/A 09/21/2014   Procedure: ROBOTIC ASSISTED MYOMECTOMY;  Surgeon: Marvene Staff, MD;  Location: [facility];  Service: Gynecology;  Laterality: N/A;  .  ROBOTIC ASSISTED TOTAL HYSTERECTOMY N/A 09/21/2014   Procedure: ROBOTIC ASSISTED TOTAL HYSTERECTOMY, EXCISION OF PELVIC ENDOMETRIOSIS;  Surgeon: Marvene Staff, MD;  Location: [facility];  Service: Gynecology;  Laterality: N/A;  . TONSILLECTOMY    . TOTAL KNEE ARTHROPLASTY Left 07/03/2017   Procedure: LEFT TOTAL KNEE ARTHROPLASTY;  Surgeon: Meredith Pel, MD;  Location: [facility];  Service: Orthopedics;  Laterality: Left;   Social History   Occupational History  . Not on file  Tobacco Use  . Smoking status: Never Smoker  . Smokeless tobacco: Never Used  Substance and Sexual Activity  . Alcohol use: No  . Drug use: No  . Sexual activity: Not Currently    Birth control/protection: None

## 2019-01-08 ENCOUNTER — Ambulatory Visit: Payer: 59

## 2019-01-08 ENCOUNTER — Other Ambulatory Visit: Payer: Self-pay

## 2019-01-08 ENCOUNTER — Encounter: Payer: Self-pay | Admitting: Orthopedic Surgery

## 2019-01-08 ENCOUNTER — Ambulatory Visit (INDEPENDENT_AMBULATORY_CARE_PROVIDER_SITE_OTHER): Payer: 59 | Admitting: Orthopedic Surgery

## 2019-01-08 DIAGNOSIS — M25561 Pain in right knee: Secondary | ICD-10-CM

## 2019-01-08 DIAGNOSIS — S83241A Other tear of medial meniscus, current injury, right knee, initial encounter: Secondary | ICD-10-CM

## 2019-01-09 ENCOUNTER — Encounter: Payer: Self-pay | Admitting: Orthopedic Surgery

## 2019-01-09 DIAGNOSIS — S83241A Other tear of medial meniscus, current injury, right knee, initial encounter: Secondary | ICD-10-CM | POA: Diagnosis not present

## 2019-01-09 MED ORDER — LIDOCAINE HCL 1 % IJ SOLN
5.0000 mL | INTRAMUSCULAR | Status: AC | PRN
  Administered 2019-01-09: 5 mL

## 2019-01-09 MED ORDER — BUPIVACAINE HCL 0.25 % IJ SOLN
4.0000 mL | INTRAMUSCULAR | Status: AC | PRN
  Administered 2019-01-09: 4 mL via INTRA_ARTICULAR

## 2019-01-09 MED ORDER — METHYLPREDNISOLONE ACETATE 40 MG/ML IJ SUSP
40.0000 mg | INTRAMUSCULAR | Status: AC | PRN
  Administered 2019-01-09: 40 mg via INTRA_ARTICULAR

## 2019-01-09 NOTE — Progress Notes (Signed)
Office Visit Note   Patient: Mary Morrison           Date of Birth: 1965/08/23           MRN: [id number] Visit Date: 01/08/2019 Requested by: Harlan Stains, MD [place],  [place] [address] PCP: Harlan Stains, MD  Subjective: Chief Complaint  Patient presents with  . Right Knee - Pain    HPI: Mary Morrison he is a patient with right knee pain.  She is had knee pain since Sunday when she was working on the yard.  She heard a loud pop in the right knee.  She has left total knee replacement which is doing well.  Reports constant throbbing pain mostly on the medial side.  Does report that the pain radiates up the hip and down to the ankle but no real numbness or tingling component to it.  Tramadol does help her.  She is considering going out on her own in terms of employment.  She did lose her job recently.              ROS: All systems reviewed are negative as they relate to the chief complaint within the history of present illness.  Patient denies  fevers or chills.   Assessment & Plan: Visit Diagnoses:  1. Right knee pain, unspecified chronicity     Plan: Impression is right knee pain with mild effusion.  I think she may have injured her medial meniscal root.  She heard a pop in her knee it seems stable.  Patient does have a knee which is fairly difficult to examine.  Collaterals are stable.  ACL and PCL feel stable.  No nerve root tension signs.  Due to the fairly incapacitating nature of her pain and potential for intervention required to prevent development of arthritis in that right knee I think MRI scanning to evaluate for medial meniscal tear is indicated.  I will see her back after that study.  Aspiration and injection of the knee is performed today.  Follow-Up Instructions: Return for after MRI.   Orders:  Orders Placed This Encounter  Procedures  . XR Knee 1-2 Views Right  . MR Knee Right w/o contrast   No orders of the defined types were placed  in this encounter.     Procedures: Large Joint Inj: R knee on 01/09/2019 10:37 AM Indications: diagnostic evaluation, joint swelling and pain Details: 18 G 1.5 in needle, superolateral approach  Arthrogram: No  Medications: 5 mL lidocaine 1 %; 40 mg methylPREDNISolone acetate 40 MG/ML; 4 mL bupivacaine 0.25 % Outcome: tolerated well, no immediate complications Procedure, treatment alternatives, risks and benefits explained, specific risks discussed. Consent was given by the patient. Immediately prior to procedure a time out was called to verify the correct patient, procedure, equipment, support staff and site/side marked as required. Patient was prepped and draped in the usual sterile fashion.       Clinical Data: No additional findings.  Objective: Vital Signs: LMP 02/10/2014   Physical Exam:   Constitutional: Patient appears well-developed HEENT:  Head: Normocephalic Eyes:EOM are normal Neck: Normal range of motion Cardiovascular: Normal rate Pulmonary/chest: Effort normal Neurologic: Patient is alert Skin: Skin is warm Psychiatric: Patient has normal mood and affect    Ortho Exam: Ortho exam demonstrates antalgic gait to the right.  Left knee has good range of motion.  Right knee has mild effusion but stable collateral cruciate ligaments.  Extensor mechanism is intact.  Pedal  pulses palpable.  No other masses lymphadenopathy or skin changes noted in that right knee region.  No groin pain with internal X rotation of the leg.  Specialty Comments:  No specialty comments available.  Imaging: Xr Knee 1-2 Views Right  Result Date: 01/09/2019 AP lateral right knee reviewed.  Minimal degenerative changes are present.  Only mild joint space narrowing on the medial side.  No acute fracture or dislocation.  Left total knee replacement in good position alignment.    PMFS History: Patient Active Problem List   Diagnosis Date Noted  . Arthritis of knee 07/03/2017  .  Varicose veins of bilateral lower extremities with other complications 54/49/2010  . Acute pain of left knee 01/25/2017  . Swelling of left knee joint 01/25/2017  . S/P hysterectomy 09/21/2014  . Lapband APS July 2015 02/17/2014  . Edema-chronic pitting in legs 01/20/2014  . Morbid obesity ([place]) 08/28/2013   Past Medical History:  Diagnosis Date  . Arthritis   . Asthma    EXERCISED INDUCED 2014   . Depression   . Fibroids   . Obesity   . Seasonal allergies   . Varicose vein of leg     Family History  Problem Relation Age of Onset  . Cancer Mother        breast/bilateral mastectomies  . Diabetes Other   . Hypertension Other   . Stroke Other   . Obesity Other     Past Surgical History:  Procedure Laterality Date  . BILATERAL SALPINGECTOMY Bilateral 09/21/2014   Procedure: BILATERAL SALPINGECTOMY;  Surgeon: Marvene Staff, MD;  Location: [facility];  Service: Gynecology;  Laterality: Bilateral;  . CESAREAN SECTION  1989  . COLONOSCOPY    . LAPAROSCOPIC GASTRIC BANDING N/A 02/17/2014   Procedure: LAPAROSCOPIC GASTRIC BANDING;  Surgeon: Pedro Earls, MD;  Location: [facility];  Service: General;  Laterality: N/A;  . ROBOT ASSISTED MYOMECTOMY N/A 09/21/2014   Procedure: ROBOTIC ASSISTED MYOMECTOMY;  Surgeon: Marvene Staff, MD;  Location: [facility];  Service: Gynecology;  Laterality: N/A;  . ROBOTIC ASSISTED TOTAL HYSTERECTOMY N/A 09/21/2014   Procedure: ROBOTIC ASSISTED TOTAL HYSTERECTOMY, EXCISION OF PELVIC ENDOMETRIOSIS;  Surgeon: Marvene Staff, MD;  Location: [facility];  Service: Gynecology;  Laterality: N/A;  . TONSILLECTOMY    . TOTAL KNEE ARTHROPLASTY Left 07/03/2017   Procedure: LEFT TOTAL KNEE ARTHROPLASTY;  Surgeon: Meredith Pel, MD;  Location: [facility];  Service: Orthopedics;  Laterality: Left;   Social History   Occupational History  . Not on file  Tobacco Use  . Smoking status: Never Smoker  . Smokeless tobacco: Never Used  Substance and Sexual  Activity  . Alcohol use: No  . Drug use: No  . Sexual activity: Not Currently    Birth control/protection: None

## 2019-01-21 ENCOUNTER — Telehealth: Payer: Self-pay | Admitting: Orthopedic Surgery

## 2019-01-21 NOTE — Telephone Encounter (Signed)
Mary Morrison is calling in with increased knee pain.  She last saw Dr. Marlou Sa 01/08/2019 and had her knee injected.  Injection only lasted about 2 days. She is scheduled for an MRI on 02/01/2019.  She is in pain and asked about another injection ( I told her it was probably too soon).  She states she is taking Tylenol and Ibuprofen with no relief.  Please advise if we can recommend something or call in medication. She uses the [facility] on [address].

## 2019-01-21 NOTE — Telephone Encounter (Signed)
Please advise. Thanks.  

## 2019-01-21 NOTE — Telephone Encounter (Signed)
Tramadol 1 po q 12 # 30 pls clala thx

## 2019-01-22 ENCOUNTER — Other Ambulatory Visit: Payer: Self-pay | Admitting: Orthopedic Surgery

## 2019-01-22 MED ORDER — TRAMADOL HCL 50 MG PO TABS: 50.0000 mg | ORAL_TABLET | Freq: Two times a day (BID) | ORAL | 0 refills | Status: DC | PRN

## 2019-01-22 NOTE — Telephone Encounter (Signed)
IC LM for patient advising Dr Marlou Sa sent medication to pharmacy.

## 2019-02-01 ENCOUNTER — Other Ambulatory Visit: Payer: Self-pay

## 2019-02-01 ENCOUNTER — Ambulatory Visit
Admission: RE | Admit: 2019-02-01 | Discharge: 2019-02-01 | Disposition: A | Discharge status: Home / Self Care | Payer: 59 | Source: Ambulatory Visit | Attending: Orthopedic Surgery | Admitting: Orthopedic Surgery

## 2019-02-01 DIAGNOSIS — M25561 Pain in right knee: Secondary | ICD-10-CM

## 2019-02-05 ENCOUNTER — Other Ambulatory Visit: Payer: Self-pay

## 2019-02-05 ENCOUNTER — Ambulatory Visit (INDEPENDENT_AMBULATORY_CARE_PROVIDER_SITE_OTHER): Payer: 59 | Admitting: Orthopedic Surgery

## 2019-02-05 DIAGNOSIS — S83241A Other tear of medial meniscus, current injury, right knee, initial encounter: Secondary | ICD-10-CM | POA: Diagnosis not present

## 2019-02-05 NOTE — Progress Notes (Signed)
Office Visit Note   Patient: Mary Morrison           Date of Birth: [birth year]           MRN: [id number] Visit Date: 02/05/2019 Requested by: Harlan Stains, MD [place],  [place] [address] PCP: Harlan Stains, MD  Subjective: Chief Complaint  Patient presents with  . Follow-up    HPI: Pt is a 53 y.o. Female who presents to the clinic complaining of R knee pain.  She had an injury on 01/05/19 when she was working in her yard and heard a loud pop followed by constant throbbing pain in her R knee.  She had fluid taken out of the knee and a cortisone injection given at the last visit, which provided 1-2 days of relief.  She reports mechanical symptoms and feelings of instability.  She has been taking Tramadol at night and using Motrin during the day, with little relief.  MRI scan is reviewed and she does have meniscal root tear along with some early edema on that medial tibial plateau and medial femoral condyle medially.  There is some extrusion of the meniscus as well on that medial side.  Overall joint space is maintained with mild degenerative changes noted in that medial compartment.              ROS: All systems reviewed are negative as they relate to the chief complaint within the history of present illness.  Patient denies  fevers or chills.   Assessment & Plan: Visit Diagnoses: No diagnosis found.  Plan: Pt is about one month out from a R knee injury.  MRI reviewed today reveals a medial meniscal root tear.  Discussed options with pt..  The patient really states that she cannot go through a total knee replacement.  She is doing well with her left total knee replacement but cannot really go through that type of procedure.  In regards to the right knee she does have a meniscal root tear which is symptomatic.  Not much but some arthritis is developed.  I think it is possible that we could repair that meniscus and the meniscal root but it will be difficult.   She does not have a lot of laxity to valgus stress.  We may need to consider pie crusting of the MCL.  I think it is possible also that technically it may not be feasible due to the anatomy of her knee.  Nonetheless she wants to try that instead of the knee replacement.  She has had an injection which only gave her relief for about 2 days.  She would have to be off that knee for about 4 weeks after surgery just to give that meniscal root tear chance to heal.  Still the risk that it could fail with her increased body mass index.  I have had that problem with other patients who are in the high BMI category with this particular procedure.  All of these risk and benefits are discussed with the patient and she would like to proceed with an attempt at meniscal root repair.  Patient understands the risk and benefits.  All questions answered.  Follow-Up Instructions: No follow-ups on file.   Orders:  No orders of the defined types were placed in this encounter.  No orders of the defined types were placed in this encounter.     Procedures: No procedures performed   Clinical Data: No additional findings.  Objective: Vital Signs:  LMP 02/10/2014   Physical Exam:   Constitutional: Patient appears well-developed HEENT:  Head: Normocephalic Eyes:EOM are normal Neck: Normal range of motion Cardiovascular: Normal rate Pulmonary/chest: Effort normal Neurologic: Patient is alert Skin: Skin is warm Psychiatric: Patient has normal mood and affect    Ortho Exam: Ortho exam demonstrates minimal laxity to valgus stress at 0 and 30 degrees on the right knee.  Her left knee has good functional range of motion and strength.  There is trace effusion in the right knee and stable collateral cruciate ligaments.  No other masses lymphadenopathy or skin changes noted in that knee region.  She does have discrete medial joint line tenderness only.  Specialty Comments:  No specialty comments available.   Imaging: No results found.   PMFS History: Patient Active Problem List   Diagnosis Date Noted  . Arthritis of knee 07/03/2017  . Varicose veins of bilateral lower extremities with other complications 65/99/3570  . Acute pain of left knee 01/25/2017  . Swelling of left knee joint 01/25/2017  . S/P hysterectomy 09/21/2014  . Lapband APS July 2015 02/17/2014  . Edema-chronic pitting in legs 01/20/2014  . Morbid obesity ([place]) 08/28/2013   Past Medical History:  Diagnosis Date  . Arthritis   . Asthma    EXERCISED INDUCED 2014   . Depression   . Fibroids   . Obesity   . Seasonal allergies   . Varicose vein of leg     Family History  Problem Relation Age of Onset  . Cancer Mother        breast/bilateral mastectomies  . Diabetes Other   . Hypertension Other   . Stroke Other   . Obesity Other     Past Surgical History:  Procedure Laterality Date  . BILATERAL SALPINGECTOMY Bilateral 09/21/2014   Procedure: BILATERAL SALPINGECTOMY;  Surgeon: Marvene Staff, MD;  Location: [facility];  Service: Gynecology;  Laterality: Bilateral;  . CESAREAN SECTION  1989  . COLONOSCOPY    . LAPAROSCOPIC GASTRIC BANDING N/A 02/17/2014   Procedure: LAPAROSCOPIC GASTRIC BANDING;  Surgeon: Pedro Earls, MD;  Location: [facility];  Service: General;  Laterality: N/A;  . ROBOT ASSISTED MYOMECTOMY N/A 09/21/2014   Procedure: ROBOTIC ASSISTED MYOMECTOMY;  Surgeon: Marvene Staff, MD;  Location: [facility];  Service: Gynecology;  Laterality: N/A;  . ROBOTIC ASSISTED TOTAL HYSTERECTOMY N/A 09/21/2014   Procedure: ROBOTIC ASSISTED TOTAL HYSTERECTOMY, EXCISION OF PELVIC ENDOMETRIOSIS;  Surgeon: Marvene Staff, MD;  Location: [facility];  Service: Gynecology;  Laterality: N/A;  . TONSILLECTOMY    . TOTAL KNEE ARTHROPLASTY Left 07/03/2017   Procedure: LEFT TOTAL KNEE ARTHROPLASTY;  Surgeon: Meredith Pel, MD;  Location: [facility];  Service: Orthopedics;  Laterality: Left;   Social History    Occupational History  . Not on file  Tobacco Use  . Smoking status: Never Smoker  . Smokeless tobacco: Never Used  Substance and Sexual Activity  . Alcohol use: No  . Drug use: No  . Sexual activity: Not Currently    Birth control/protection: None

## 2019-02-07 ENCOUNTER — Encounter: Payer: Self-pay | Admitting: Orthopedic Surgery

## 2019-02-20 ENCOUNTER — Telehealth: Payer: Self-pay | Admitting: Orthopedic Surgery

## 2019-02-20 NOTE — Telephone Encounter (Signed)
Please advise. Thanks.  

## 2019-02-20 NOTE — Telephone Encounter (Signed)
Ok for rx and ok for amox 2 g 1 hr before tooth thuing

## 2019-02-20 NOTE — Telephone Encounter (Signed)
Patient calling to ask questions about being premedicated for surgery since she had to be in the past when having teeth cleaned. Patient had left total knee arthroplasty done in 2018.  Also patient was visiting her father recently and did not have her Rx Tramadol with her, so she took her fathers's Rx Tylenol 800mg  (every 12 hours).  She states the Rx Tylenol works better than the Tramadol and wants to know if you would consider writing this Rx for her instead.    cb [phone]

## 2019-02-21 MED ORDER — AMOXICILLIN 500 MG PO TABS: ORAL_TABLET | ORAL | 0 refills | Status: DC

## 2019-02-21 NOTE — Telephone Encounter (Signed)
Patient is not having dental procedure, but instead is having knee surgery. She wanted to be sure that she did not need antibiotic prior to knee surgery as it seemed more invasive. Patient is also requesting ibuprofen 800mg  tablets, not tylenol.   OK for Ibuprofen 800mg ? Patient did request antibiotic be sent in for future dental appts. This was done. [facility] on [address]

## 2019-02-24 ENCOUNTER — Other Ambulatory Visit: Payer: Self-pay | Admitting: Surgical

## 2019-02-24 ENCOUNTER — Encounter: Payer: Self-pay | Admitting: Orthopedic Surgery

## 2019-02-24 DIAGNOSIS — S83241D Other tear of medial meniscus, current injury, right knee, subsequent encounter: Secondary | ICD-10-CM

## 2019-02-24 DIAGNOSIS — M1711 Unilateral primary osteoarthritis, right knee: Secondary | ICD-10-CM

## 2019-02-24 MED ORDER — ASPIRIN EC 81 MG PO TBEC: 81.0000 mg | DELAYED_RELEASE_TABLET | Freq: Two times a day (BID) | ORAL | 0 refills | Status: DC

## 2019-02-24 MED ORDER — TRAMADOL HCL 50 MG PO TABS: 50.0000 mg | ORAL_TABLET | Freq: Four times a day (QID) | ORAL | 0 refills | Status: DC | PRN

## 2019-02-24 MED ORDER — IBUPROFEN 800 MG PO TABS: 800.0000 mg | ORAL_TABLET | Freq: Four times a day (QID) | ORAL | 0 refills | Status: DC | PRN

## 2019-02-24 NOTE — Telephone Encounter (Signed)
Okay for ibuprofen 800 mg 1 p.o. twice daily Lurena Joiner is already called this and thanks

## 2019-02-24 NOTE — Telephone Encounter (Signed)
I called patient and advised. 

## 2019-02-25 ENCOUNTER — Telehealth: Payer: Self-pay | Admitting: Orthopedic Surgery

## 2019-02-25 NOTE — Telephone Encounter (Signed)
IC s/w patient and advised. She verbalized understanding.  

## 2019-02-25 NOTE — Telephone Encounter (Signed)
Patient has surgery yesterday with Dr. Marlou Sa.  She has some post op questions that she would like to ask you.  CB#[phone].  Thank you.

## 2019-02-25 NOTE — Telephone Encounter (Signed)
I s/w patient She wants to know when ok to start bending and doing SLR? Please advise.

## 2019-02-25 NOTE — Telephone Encounter (Signed)
Okay for straight leg raises but I would not bend that knee this week just let that thing heal and for the first week and then we can start bending it she does need to start doing some ankle pumps though

## 2019-03-05 ENCOUNTER — Encounter: Payer: Self-pay | Admitting: Orthopedic Surgery

## 2019-03-05 ENCOUNTER — Ambulatory Visit (INDEPENDENT_AMBULATORY_CARE_PROVIDER_SITE_OTHER): Payer: 59 | Admitting: Orthopedic Surgery

## 2019-03-05 DIAGNOSIS — S83241A Other tear of medial meniscus, current injury, right knee, initial encounter: Secondary | ICD-10-CM

## 2019-03-05 MED ORDER — IBUPROFEN 800 MG PO TABS: 800.0000 mg | ORAL_TABLET | Freq: Four times a day (QID) | ORAL | 0 refills | Status: DC | PRN

## 2019-03-05 MED ORDER — TRAMADOL HCL 50 MG PO TABS: 50.0000 mg | ORAL_TABLET | Freq: Three times a day (TID) | ORAL | 0 refills | Status: DC

## 2019-03-05 MED ORDER — ASPIRIN EC 81 MG PO TBEC: 81.0000 mg | DELAYED_RELEASE_TABLET | Freq: Two times a day (BID) | ORAL | 0 refills | Status: DC

## 2019-03-05 NOTE — Progress Notes (Signed)
Post-Op Visit Note   Patient: Mary Morrison           Date of Birth: [birth year]           MRN: [id number] Visit Date: 03/05/2019 PCP: Harlan Stains, MD   Assessment & Plan:  Chief Complaint: No chief complaint on file.  Visit Diagnoses: No diagnosis found.  Plan: Patient is a 53 year old female who presents s/p right knee meniscal root repair on 02/24/2019.  Patient is doing well, her pain is improving.  She has remained nonweightbearing, using a wheelchair except for when she uses the restroom where she uses crutches.  She has been doing straight leg raises.  She has not been bending her knee past 45 degrees.  Patient will be continued on baby aspirin twice a day for DVT prophylaxis.  Continue tramadol and 800 mg ibuprofen for pain control.  Patient given temporary handicap placard.  Instructed patient to continue preventing her leg from bending past 90 degrees.  Patient will call the office if she has signs of DVT.  She will follow-up in 4 weeks and I recommended that she get her knee bending past 90 degrees 1 week before she returns to weightbearing at her next clinic appointment.  Patient agrees with the plan.  Follow-Up Instructions: No follow-ups on file.   Orders:  No orders of the defined types were placed in this encounter.  Meds ordered this encounter  Medications  . ibuprofen (ADVIL) 800 MG tablet    Sig: Take 1 tablet (800 mg total) by mouth every 6 (six) hours as needed.    Dispense:  60 tablet    Refill:  0  . traMADol (ULTRAM) 50 MG tablet    Sig: Take 1 tablet (50 mg total) by mouth every 8 (eight) hours.    Dispense:  30 tablet    Refill:  0  . aspirin EC 81 MG tablet    Sig: Take 1 tablet (81 mg total) by mouth 2 (two) times daily.    Dispense:  60 tablet    Refill:  0    Imaging: No results found.  PMFS History: Patient Active Problem List   Diagnosis Date Noted  . Arthritis of knee 07/03/2017  . Varicose veins of bilateral lower extremities with  other complications 31/51/7616  . Acute pain of left knee 01/25/2017  . Swelling of left knee joint 01/25/2017  . S/P hysterectomy 09/21/2014  . Lapband APS July 2015 02/17/2014  . Edema-chronic pitting in legs 01/20/2014  . Morbid obesity ([place]) 08/28/2013   Past Medical History:  Diagnosis Date  . Arthritis   . Asthma    EXERCISED INDUCED 2014   . Depression   . Fibroids   . Obesity   . Seasonal allergies   . Varicose vein of leg     Family History  Problem Relation Age of Onset  . Cancer Mother        breast/bilateral mastectomies  . Diabetes Other   . Hypertension Other   . Stroke Other   . Obesity Other     Past Surgical History:  Procedure Laterality Date  . BILATERAL SALPINGECTOMY Bilateral 09/21/2014   Procedure: BILATERAL SALPINGECTOMY;  Surgeon: Marvene Staff, MD;  Location: [facility];  Service: Gynecology;  Laterality: Bilateral;  . CESAREAN SECTION  1989  . COLONOSCOPY    . LAPAROSCOPIC GASTRIC BANDING N/A 02/17/2014   Procedure: LAPAROSCOPIC GASTRIC BANDING;  Surgeon: Pedro Earls, MD;  Location: [facility];  Service: General;  Laterality: N/A;  . ROBOT ASSISTED MYOMECTOMY N/A 09/21/2014   Procedure: ROBOTIC ASSISTED MYOMECTOMY;  Surgeon: Marvene Staff, MD;  Location: [facility];  Service: Gynecology;  Laterality: N/A;  . ROBOTIC ASSISTED TOTAL HYSTERECTOMY N/A 09/21/2014   Procedure: ROBOTIC ASSISTED TOTAL HYSTERECTOMY, EXCISION OF PELVIC ENDOMETRIOSIS;  Surgeon: Marvene Staff, MD;  Location: [facility];  Service: Gynecology;  Laterality: N/A;  . TONSILLECTOMY    . TOTAL KNEE ARTHROPLASTY Left 07/03/2017   Procedure: LEFT TOTAL KNEE ARTHROPLASTY;  Surgeon: Meredith Pel, MD;  Location: [facility];  Service: Orthopedics;  Laterality: Left;   Social History   Occupational History  . Not on file  Tobacco Use  . Smoking status: Never Smoker  . Smokeless tobacco: Never Used  Substance and Sexual Activity  . Alcohol use: No  . Drug use: No  . Sexual  activity: Not Currently    Birth control/protection: None

## 2019-03-07 ENCOUNTER — Telehealth: Payer: Self-pay | Admitting: Orthopedic Surgery

## 2019-03-07 ENCOUNTER — Encounter: Payer: Self-pay | Admitting: Orthopedic Surgery

## 2019-03-07 NOTE — Telephone Encounter (Signed)
[facility] [place] [facility] said they received a script for this pt for ibuprofen 800 MG to be taken every 6 hours but pharmacist said he needs clarification on that directions cause usually its taken every 8 hours.  [phone]

## 2019-03-07 NOTE — Telephone Encounter (Signed)
Please advise 

## 2019-03-07 NOTE — Telephone Encounter (Signed)
She should take that twice a day max.  Can you let me know who ordered that.  Thanks

## 2019-03-07 NOTE — Telephone Encounter (Signed)
[facility] back and LM on their voicemail advising per Dr Marlou Sa.  Looks like original rx ordered by [employer].

## 2019-03-18 ENCOUNTER — Other Ambulatory Visit: Payer: Self-pay | Admitting: Surgical

## 2019-04-02 ENCOUNTER — Encounter: Payer: Self-pay | Admitting: Orthopedic Surgery

## 2019-04-02 ENCOUNTER — Other Ambulatory Visit: Payer: Self-pay

## 2019-04-02 ENCOUNTER — Ambulatory Visit (INDEPENDENT_AMBULATORY_CARE_PROVIDER_SITE_OTHER): Payer: 59 | Admitting: Orthopedic Surgery

## 2019-04-02 DIAGNOSIS — M1712 Unilateral primary osteoarthritis, left knee: Secondary | ICD-10-CM

## 2019-04-02 DIAGNOSIS — S83241A Other tear of medial meniscus, current injury, right knee, initial encounter: Secondary | ICD-10-CM

## 2019-04-02 NOTE — Progress Notes (Signed)
Post-Op Visit Note   Patient: Mary Morrison           Date of Birth: [birth year]           MRN: [id number] Visit Date: 04/02/2019 PCP: Harlan Stains, MD   Assessment & Plan:  Chief Complaint:  Chief Complaint  Patient presents with  . Right Knee - Follow-up   Visit Diagnoses:  1. Tear of medial meniscus of right knee, current, unspecified tear type, initial encounter   2. Unilateral primary osteoarthritis, left knee     Plan: Patient is now 5 weeks out right knee medial meniscal root repair.  On exam she is doing reasonably well.  No effusion in the knee.  Incisions intact.  Flexion past 90.  No calf tenderness.  Plan is to continue taking 1 aspirin a day for DVT prophylaxis okay to start partial weightbearing on Sunday with crutches for the following 2 weeks.  After that she can progress from partial weightbearing to full weightbearing over the next 2 weeks.  Off crutches 4 weeks from Sunday come back in 6 weeks for clinical recheck  Follow-Up Instructions: Return in about 6 weeks (around 05/14/2019).   Orders:  No orders of the defined types were placed in this encounter.  No orders of the defined types were placed in this encounter.   Imaging: No results found.  PMFS History: Patient Active Problem List   Diagnosis Date Noted  . Arthritis of knee 07/03/2017  . Varicose veins of bilateral lower extremities with other complications [id number]  . Acute pain of left knee 01/25/2017  . Swelling of left knee joint 01/25/2017  . S/P hysterectomy 09/21/2014  . Lapband APS July 2015 02/17/2014  . Edema-chronic pitting in legs 01/20/2014  . Morbid obesity ([place]) 08/28/2013   Past Medical History:  Diagnosis Date  . Arthritis   . Asthma    EXERCISED INDUCED 2014   . Depression   . Fibroids   . Obesity   . Seasonal allergies   . Varicose vein of leg     Family History  Problem Relation Age of Onset  . Cancer Mother        breast/bilateral mastectomies  .  Diabetes Other   . Hypertension Other   . Stroke Other   . Obesity Other     Past Surgical History:  Procedure Laterality Date  . BILATERAL SALPINGECTOMY Bilateral 09/21/2014   Procedure: BILATERAL SALPINGECTOMY;  Surgeon: Marvene Staff, MD;  Location: [facility];  Service: Gynecology;  Laterality: Bilateral;  . CESAREAN SECTION  1989  . COLONOSCOPY    . LAPAROSCOPIC GASTRIC BANDING N/A 02/17/2014   Procedure: LAPAROSCOPIC GASTRIC BANDING;  Surgeon: Pedro Earls, MD;  Location: [facility];  Service: General;  Laterality: N/A;  . ROBOT ASSISTED MYOMECTOMY N/A 09/21/2014   Procedure: ROBOTIC ASSISTED MYOMECTOMY;  Surgeon: Marvene Staff, MD;  Location: [facility];  Service: Gynecology;  Laterality: N/A;  . ROBOTIC ASSISTED TOTAL HYSTERECTOMY N/A 09/21/2014   Procedure: ROBOTIC ASSISTED TOTAL HYSTERECTOMY, EXCISION OF PELVIC ENDOMETRIOSIS;  Surgeon: Marvene Staff, MD;  Location: [facility];  Service: Gynecology;  Laterality: N/A;  . TONSILLECTOMY    . TOTAL KNEE ARTHROPLASTY Left 07/03/2017   Procedure: LEFT TOTAL KNEE ARTHROPLASTY;  Surgeon: Meredith Pel, MD;  Location: [facility];  Service: Orthopedics;  Laterality: Left;   Social History   Occupational History  . Not on file  Tobacco Use  . Smoking status: Never Smoker  . Smokeless tobacco: Never  Used  Substance and Sexual Activity  . Alcohol use: No  . Drug use: No  . Sexual activity: Not Currently    Birth control/protection: None

## 2019-04-08 ENCOUNTER — Telehealth: Payer: Self-pay | Admitting: Orthopedic Surgery

## 2019-04-08 NOTE — Telephone Encounter (Signed)
Patient called and wanted to know if she can start driving.  Please call patient to [phone]

## 2019-04-08 NOTE — Telephone Encounter (Signed)
Please advise. Thanks.  

## 2019-04-08 NOTE — Telephone Encounter (Signed)
4 w post op

## 2019-04-09 NOTE — Telephone Encounter (Signed)
S/w patient and advised

## 2019-04-13 ENCOUNTER — Other Ambulatory Visit: Payer: Self-pay | Admitting: Surgical

## 2019-05-14 ENCOUNTER — Encounter: Payer: Self-pay | Admitting: Orthopedic Surgery

## 2019-05-14 ENCOUNTER — Ambulatory Visit (INDEPENDENT_AMBULATORY_CARE_PROVIDER_SITE_OTHER): Payer: 59 | Admitting: Orthopedic Surgery

## 2019-05-14 ENCOUNTER — Other Ambulatory Visit: Payer: Self-pay

## 2019-05-14 VITALS — Ht 66.0 in | Wt 263.0 lb

## 2019-05-14 DIAGNOSIS — S83241A Other tear of medial meniscus, current injury, right knee, initial encounter: Secondary | ICD-10-CM

## 2019-05-14 NOTE — Progress Notes (Signed)
Post-Op Visit Note   Patient: Mary Morrison           Date of Birth: 10-Dec-1965           MRN: [id number] Visit Date: 05/14/2019 PCP: Harlan Stains, MD   Assessment & Plan:  Chief Complaint:  Chief Complaint  Patient presents with  . Right Knee - Pain   Visit Diagnoses:  1. Tear of medial meniscus of right knee, current, unspecified tear type, initial encounter     Plan: Mary Morrison is a patient with meniscal root repair done 02/24/2019.  Started full weightbearing about a week ago.  She is having little bit of popping but it is in the front of her knee.  She is able to walk with significantly less pain that she had in the past.  BMI 42.45.  On new meds for depression.  Does have a little bit of pain when she pivots.  On exam there is no effusion.  Range of motion is excellent.  The the popping she has is primarily postsurgical anterior type postarthroscopy pain.  Plan at this time is observation for a month.  There are a lot of issues at play in terms of her insurance and whether or not this meniscal root repair is working.  In general the repair was done to delay need for knee replacement.  She is walking around less pain but I do not know if that is going to last at her current weight.  I think she may need knee replacement sometime in the future but for now I think the repair is holding up we will see her in a month and decide if we need to proceed with any further intervention.  Follow-Up Instructions: Return in about 4 weeks (around 06/11/2019).   Orders:  No orders of the defined types were placed in this encounter.  No orders of the defined types were placed in this encounter.   Imaging: No results found.  PMFS History: Patient Active Problem List   Diagnosis Date Noted  . Arthritis of knee 07/03/2017  . Varicose veins of bilateral lower extremities with other complications 123456  . Acute pain of left knee 01/25/2017  . Swelling of left knee joint 01/25/2017  .  S/P hysterectomy 09/21/2014  . Lapband APS July 2015 02/17/2014  . Edema-chronic pitting in legs 01/20/2014  . Morbid obesity ([place]) 08/28/2013   Past Medical History:  Diagnosis Date  . Arthritis   . Asthma    EXERCISED INDUCED 2014   . Depression   . Fibroids   . Obesity   . Seasonal allergies   . Varicose vein of leg     Family History  Problem Relation Age of Onset  . Cancer Mother        breast/bilateral mastectomies  . Diabetes Other   . Hypertension Other   . Stroke Other   . Obesity Other     Past Surgical History:  Procedure Laterality Date  . BILATERAL SALPINGECTOMY Bilateral 09/21/2014   Procedure: BILATERAL SALPINGECTOMY;  Surgeon: Marvene Staff, MD;  Location: [facility];  Service: Gynecology;  Laterality: Bilateral;  . CESAREAN SECTION  1989  . COLONOSCOPY    . LAPAROSCOPIC GASTRIC BANDING N/A 02/17/2014   Procedure: LAPAROSCOPIC GASTRIC BANDING;  Surgeon: Pedro Earls, MD;  Location: [facility];  Service: General;  Laterality: N/A;  . ROBOT ASSISTED MYOMECTOMY N/A 09/21/2014   Procedure: ROBOTIC ASSISTED MYOMECTOMY;  Surgeon: Marvene Staff, MD;  Location: [facility];  Service: Gynecology;  Laterality: N/A;  . ROBOTIC ASSISTED TOTAL HYSTERECTOMY N/A 09/21/2014   Procedure: ROBOTIC ASSISTED TOTAL HYSTERECTOMY, EXCISION OF PELVIC ENDOMETRIOSIS;  Surgeon: Marvene Staff, MD;  Location: [facility];  Service: Gynecology;  Laterality: N/A;  . TONSILLECTOMY    . TOTAL KNEE ARTHROPLASTY Left 07/03/2017   Procedure: LEFT TOTAL KNEE ARTHROPLASTY;  Surgeon: Meredith Pel, MD;  Location: [facility];  Service: Orthopedics;  Laterality: Left;   Social History   Occupational History  . Not on file  Tobacco Use  . Smoking status: Never Smoker  . Smokeless tobacco: Never Used  Substance and Sexual Activity  . Alcohol use: No  . Drug use: No  . Sexual activity: Not Currently    Birth control/protection: None

## 2019-05-15 ENCOUNTER — Other Ambulatory Visit: Payer: Self-pay | Admitting: Surgical

## 2019-05-20 ENCOUNTER — Other Ambulatory Visit: Payer: Self-pay | Admitting: Surgical

## 2019-06-11 ENCOUNTER — Ambulatory Visit: Payer: 59 | Admitting: Orthopedic Surgery

## 2019-06-16 ENCOUNTER — Other Ambulatory Visit: Payer: Self-pay

## 2019-06-16 ENCOUNTER — Ambulatory Visit (INDEPENDENT_AMBULATORY_CARE_PROVIDER_SITE_OTHER): Payer: 59 | Admitting: Orthopedic Surgery

## 2019-06-16 DIAGNOSIS — M1711 Unilateral primary osteoarthritis, right knee: Secondary | ICD-10-CM | POA: Diagnosis not present

## 2019-06-17 ENCOUNTER — Telehealth: Payer: Self-pay | Admitting: Orthopedic Surgery

## 2019-06-17 NOTE — Telephone Encounter (Signed)
Please advise. Thanks.  

## 2019-06-17 NOTE — Telephone Encounter (Signed)
Patient would like to proceed with right total knee arthroplasty and has chosen a date of July 10, 2019.  She would like to do this by the end of the year while she has [employer] coverage.  If agreeable, please  provide surgery sheet.

## 2019-06-18 ENCOUNTER — Encounter: Payer: Self-pay | Admitting: Orthopedic Surgery

## 2019-06-18 NOTE — Telephone Encounter (Signed)
Blue sheet done please call thanks

## 2019-06-18 NOTE — Progress Notes (Signed)
Office Visit Note   Patient: Mary Morrison           Date of Birth: 1965-11-19           MRN: [id number] Visit Date: 06/16/2019 Requested by: Harlan Stains, MD [address] [place],  [place] [address] PCP: Harlan Stains, MD  Subjective: Chief Complaint  Patient presents with   Right Knee - Follow-up    HPI: Mary Morrison is now about 3 and half months out right knee meniscal root repair.  She does have increased body mass index.  She still having some recurrent pain in the knee.  The pain is worsening.  She feels like her knee is "messed up again".  Reports primarily anterior pain.  Hard for her to weight-bear.  Hurts her most nights.  She actually went to the scooter.  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  She is doing well from her left knee replacement.  The pain will occasionally radiate to the ankle but she denies any back pain.  From a mental standpoint she is in a better position than she was 3 months ago.              ROS: All systems reviewed are negative as they relate to the chief complaint within the history of present illness.  Patient denies  fevers or chills.   Assessment & Plan: Visit Diagnoses:  1. Arthritis of right knee     Plan: Impression is right knee pain which may indicate some degree of failure of the repair.  She does not have much effusion in the knee which I think is a good sign.  Hard to know if that meniscal root repair is doing what it was designed to do.  I think with her clinical pain recurrence she likely is having increased stress in that medial compartment either from a partially functioning meniscal root repair or progression of arthritis.  Nonetheless her symptoms are becoming untenable.  She is having night pain and rest pain.  We discussed total knee replacement.  She has called back and decided that she wants to proceed with that.  It was difficult recovery for her for the left knee but she is ready to go through  that again with the right knee.  Again not ideal in her circumstances to undergo knee replacement at this age but really she has no other choice for quality of life.  Weight loss would definitely help with longevity of the implants.  Patient understands the risk and benefits of knee replacement.  We will plan to match the implant size left versus right.  All questions answered  Follow-Up Instructions: No follow-ups on file.   Orders:  No orders of the defined types were placed in this encounter.  No orders of the defined types were placed in this encounter.     Procedures: No procedures performed   Clinical Data: No additional findings.  Objective: Vital Signs: LMP 02/10/2014   Physical Exam:   Constitutional: Patient appears well-developed HEENT:  Head: Normocephalic Eyes:EOM are normal Neck: Normal range of motion Cardiovascular: Normal rate Pulmonary/chest: Effort normal Neurologic: Patient is alert Skin: Skin is warm Psychiatric: Patient has normal mood and affect    Ortho Exam: Ortho exam demonstrates no real effusion in the right knee.  Range of motion is good.  Has anteromedial and posterior medial joint line tenderness.  Collateral crucial ligaments are stable.  Has some degree of  global knee pain as well particularly around the periretinacular area of the patella.  Pedal pulses intact.  Specialty Comments:  No specialty comments available.  Imaging: No results found.   PMFS History: Patient Active Problem List   Diagnosis Date Noted   Arthritis of knee 07/03/2017   Varicose veins of bilateral lower extremities with other complications 123456   Acute pain of left knee 01/25/2017   Swelling of left knee joint 01/25/2017   S/P hysterectomy 09/21/2014   Lapband APS July 2015 02/17/2014   Edema-chronic pitting in legs 01/20/2014   Morbid obesity ([place]) 08/28/2013   Past Medical History:  Diagnosis Date   Arthritis    Asthma    EXERCISED  INDUCED 2014    Depression    Fibroids    Obesity    Seasonal allergies    Varicose vein of leg     Family History  Problem Relation Age of Onset   Cancer Mother        breast/bilateral mastectomies   Diabetes Other    Hypertension Other    Stroke Other    Obesity Other     Past Surgical History:  Procedure Laterality Date   BILATERAL SALPINGECTOMY Bilateral 09/21/2014   Procedure: BILATERAL SALPINGECTOMY;  Surgeon: Marvene Staff, MD;  Location: [facility];  Service: Gynecology;  Laterality: Bilateral;   CESAREAN SECTION  1989   COLONOSCOPY     LAPAROSCOPIC GASTRIC BANDING N/A 02/17/2014   Procedure: LAPAROSCOPIC GASTRIC BANDING;  Surgeon: Pedro Earls, MD;  Location: [facility];  Service: General;  Laterality: N/A;   ROBOT ASSISTED MYOMECTOMY N/A 09/21/2014   Procedure: ROBOTIC ASSISTED MYOMECTOMY;  Surgeon: Marvene Staff, MD;  Location: [facility];  Service: Gynecology;  Laterality: N/A;   ROBOTIC ASSISTED TOTAL HYSTERECTOMY N/A 09/21/2014   Procedure: ROBOTIC ASSISTED TOTAL HYSTERECTOMY, EXCISION OF PELVIC ENDOMETRIOSIS;  Surgeon: Marvene Staff, MD;  Location: [facility];  Service: Gynecology;  Laterality: N/A;   TONSILLECTOMY     TOTAL KNEE ARTHROPLASTY Left 07/03/2017   Procedure: LEFT TOTAL KNEE ARTHROPLASTY;  Surgeon: Meredith Pel, MD;  Location: [facility];  Service: Orthopedics;  Laterality: Left;   Social History   Occupational History   Not on file  Tobacco Use   Smoking status: Never Smoker   Smokeless tobacco: Never Used  Substance and Sexual Activity   Alcohol use: No   Drug use: No   Sexual activity: Not Currently    Birth control/protection: None

## 2019-06-18 NOTE — Telephone Encounter (Signed)
Gave sheet to debbie to contact patient.

## 2019-06-30 ENCOUNTER — Other Ambulatory Visit: Payer: Self-pay

## 2019-07-04 NOTE — Pre-Procedure Instructions (Signed)
[facility] [place] [address] [place] [address] Phone: [phone] Fax: [phone]  [id number] [place] [address] Phone: [phone] Fax: [phone]  [facility] [place] [place] - [id number] [place] [id number] Phone: [phone] Fax: [phone]     Your procedure is scheduled on Thursday, December 17th from 12:15 PM to 3:12 PM.  Report to [facility] Main Entrance "A" at 10:15 A.M., and check in at the Admitting office.  Call this number if you have problems the morning of surgery:  [phone].  Call [phone] if you have any questions prior to your surgery date Monday-Friday 8am-4pm.   Remember:  Do not eat after midnight the night before your surgery.  You may drink clear liquids until 09:15 AM the morning of your surgery.   Clear liquids allowed are: Water, Non-Citrus Juices (without pulp), Carbonated Beverages, Clear Tea, Black Coffee Only, and Gatorade   Please complete your PRE-SURGERY ENSURE that was provided to you by 09:15 AM the morning of surgery.  Please, if able, drink it in one setting. DO NOT SIP.    Take these medicines the morning of surgery with A SIP OF WATER: buPROPion (WELLBUTRIN XL)  IF NEEDED: traMADol (ULTRAM) albuterol (VENTOLIN HFA) 108 (90 Base) inhaler. Please bring all inhalers with you the day of surgery.   Follow your surgeon's instructions on when to stop Aspirin.  If no instructions were given by your surgeon then you will need to call the office to get those instructions.    As of today, STOP taking any Aspirin (unless otherwise instructed by your surgeon), diclofenac Sodium (VOLTAREN) GEL, Aleve, Naproxen, Ibuprofen, Motrin, Advil, Goody's, BC's, all herbal medications, fish oil, and all vitamins.    The Morning of Surgery  Do not wear jewelry, make-up or nail polish.  Do  not wear lotions, powders, perfumes, or deodorant  Do not shave 48 hours prior to surgery.    Do not bring valuables to the hospital.  [facility] is not responsible for any belongings or valuables.  If you are a smoker, DO NOT Smoke 24 hours prior to surgery  If you wear a CPAP at night please bring your mask, tubing, and machine the morning of surgery   Remember that you must have someone to transport you home after your surgery, and remain with you for 24 hours if you are discharged the same day.   Please bring cases for contacts, glasses, hearing aids, dentures or bridgework because it cannot be worn into surgery.    Leave your suitcase in the car.  After surgery it may be brought to your room.  For patients admitted to the hospital, discharge time will be determined by your treatment team.  Patients discharged the day of surgery will not be allowed to drive home.    Special instructions:   [facility]- Preparing For Surgery  Before surgery, you can play an important role. Because skin is not sterile, your skin needs to be as free of germs as possible. You can reduce the number of germs on your skin by washing with CHG (chlorahexidine gluconate) Soap before surgery.  CHG is an antiseptic cleaner which kills germs and bonds with the skin to continue killing germs even after washing.    Oral Hygiene is also important to reduce your risk of infection.  Remember - BRUSH YOUR TEETH THE MORNING  OF SURGERY WITH YOUR REGULAR TOOTHPASTE  Please do not use if you have an allergy to CHG or antibacterial soaps. If your skin becomes reddened/irritated stop using the CHG.  Do not shave (including legs and underarms) for at least 48 hours prior to first CHG shower. It is OK to shave your face.  Please follow these instructions carefully.   1. Shower the NIGHT BEFORE SURGERY and the MORNING OF SURGERY with CHG Soap.   2. If you chose to wash your hair, wash your hair first as usual with your  normal shampoo.  3. After you shampoo, rinse your hair and body thoroughly to remove the shampoo.  4. Use CHG as you would any other liquid soap. You can apply CHG directly to the skin and wash gently with a scrungie or a clean washcloth.   5. Apply the CHG Soap to your body ONLY FROM THE NECK DOWN.  Do not use on open wounds or open sores. Avoid contact with your eyes, ears, mouth and genitals (private parts). Wash Face and genitals (private parts)  with your normal soap.   6. Wash thoroughly, paying special attention to the area where your surgery will be performed.  7. Thoroughly rinse your body with warm water from the neck down.  8. DO NOT shower/wash with your normal soap after using and rinsing off the CHG Soap.  9. Pat yourself dry with a CLEAN TOWEL.  10. Wear CLEAN PAJAMAS to bed the night before surgery, wear comfortable clothes the morning of surgery  11. Place CLEAN SHEETS on your bed the night of your first shower and DO NOT SLEEP WITH PETS.    Day of Surgery:  Please shower the morning of surgery with the CHG soap Do not apply any deodorants/lotions. Please wear clean clothes to the hospital/surgery center.   Remember to brush your teeth WITH YOUR REGULAR TOOTHPASTE.   Please read over the following fact sheets that you were given.

## 2019-07-07 ENCOUNTER — Encounter (HOSPITAL_COMMUNITY): Payer: Self-pay

## 2019-07-07 ENCOUNTER — Encounter (HOSPITAL_COMMUNITY)
Admission: RE | Admit: 2019-07-07 | Discharge: 2019-07-07 | Disposition: A | Discharge status: Home / Self Care | Payer: 59 | Source: Ambulatory Visit | Attending: Orthopedic Surgery | Admitting: Orthopedic Surgery

## 2019-07-07 ENCOUNTER — Other Ambulatory Visit: Payer: Self-pay

## 2019-07-07 ENCOUNTER — Other Ambulatory Visit (HOSPITAL_COMMUNITY)
Admission: RE | Admit: 2019-07-07 | Discharge: 2019-07-07 | Disposition: A | Discharge status: Home / Self Care | Payer: 59 | Source: Ambulatory Visit | Attending: Orthopedic Surgery | Admitting: Orthopedic Surgery

## 2019-07-07 DIAGNOSIS — M1711 Unilateral primary osteoarthritis, right knee: Secondary | ICD-10-CM | POA: Insufficient documentation

## 2019-07-07 DIAGNOSIS — Z01812 Encounter for preprocedural laboratory examination: Secondary | ICD-10-CM | POA: Insufficient documentation

## 2019-07-07 LAB — SURGICAL PCR SCREEN
MRSA, PCR: NEGATIVE
Staphylococcus aureus: NEGATIVE

## 2019-07-07 LAB — URINALYSIS, ROUTINE W REFLEX MICROSCOPIC
Bilirubin Urine: NEGATIVE
Glucose, UA: NEGATIVE mg/dL
Hgb urine dipstick: NEGATIVE
Ketones, ur: NEGATIVE mg/dL
Leukocytes,Ua: NEGATIVE
Nitrite: NEGATIVE
Protein, ur: NEGATIVE mg/dL
Specific Gravity, Urine: 1.032 — ABNORMAL HIGH (ref 1.005–1.030)
pH: 6 (ref 5.0–8.0)

## 2019-07-07 LAB — CBC
HCT: 41 % (ref 36.0–46.0)
Hemoglobin: 13.1 g/dL (ref 12.0–15.0)
MCH: 34.7 pg — ABNORMAL HIGH (ref 26.0–34.0)
MCHC: 32 g/dL (ref 30.0–36.0)
MCV: 108.5 fL — ABNORMAL HIGH (ref 80.0–100.0)
Platelets: 207 10*3/uL (ref 150–400)
RBC: 3.78 MIL/uL — ABNORMAL LOW (ref 3.87–5.11)
RDW: 12.8 % (ref 11.5–15.5)
WBC: 5.2 10*3/uL (ref 4.0–10.5)
nRBC: 0 % (ref 0.0–0.2)

## 2019-07-07 LAB — BASIC METABOLIC PANEL
Anion gap: 8 (ref 5–15)
BUN: 17 mg/dL (ref 6–20)
CO2: 29 mmol/L (ref 22–32)
Calcium: 9 mg/dL (ref 8.9–10.3)
Chloride: 103 mmol/L (ref 98–111)
Creatinine, Ser: 0.88 mg/dL (ref 0.44–1.00)
GFR calc Af Amer: >60 mL/min (ref >60)
GFR calc non Af Amer: >60 mL/min (ref >60)
Glucose, Bld: 105 mg/dL — ABNORMAL HIGH (ref 70–99)
Potassium: 3.7 mmol/L (ref 3.5–5.1)
Sodium: 140 mmol/L (ref 135–145)

## 2019-07-07 NOTE — Progress Notes (Addendum)
PCP - Harlan Stains, MD Cardiologist - pt denies  Chest x-ray - pt denies past year EKG - pt denies past year  PPM/ICD- n/a DEVICE- n/a REP Notified- n/a  Stress Test - pt denies ECHO - pt denies  Cardiac Cath - pt denies  Sleep Study - pt denies CPAP -  n/a  Fasting Blood Sugar - n/a Checks Blood Sugar _____ times a day-n/a  Blood Thinner Instructions: n/a Aspirin Instructions: n/a  ERAS Protocol- Yes Ensure pre-surgery drink or water given- Yes-Ensure given  COVID testing- 07/07/2019 1450  Anesthesia review: No  Patient denies shortness of breath, fever, cough and chest pain at PAT appointment  Patient verbalized understanding of instructions that were given to them at the PAT appointment. Patient was also instructed that they will need to review over the PAT instructions again at home before surgery.

## 2019-07-08 LAB — URINE CULTURE

## 2019-07-08 LAB — NOVEL CORONAVIRUS, NAA (HOSP ORDER, SEND-OUT TO REF LAB; TAT 18-24 HRS): SARS-CoV-2, NAA: NOT DETECTED

## 2019-07-09 MED ORDER — TRANEXAMIC ACID-NACL 1000-0.7 MG/100ML-% IV SOLN
1000.0000 mg | INTRAVENOUS | Status: AC
  Administered 2019-07-10: 13:00:00 1000 mg via INTRAVENOUS
  Filled 2019-07-09: qty 100

## 2019-07-09 MED ORDER — DEXTROSE 5 % IV SOLN
3.0000 g | INTRAVENOUS | Status: AC
  Administered 2019-07-10: 3 g via INTRAVENOUS
  Filled 2019-07-09: qty 3

## 2019-07-10 ENCOUNTER — Inpatient Hospital Stay (HOSPITAL_COMMUNITY)
Admission: RE | Admit: 2019-07-10 | Discharge: 2019-07-11 | DRG: 470 | Disposition: A | Discharge status: Home / Self Care | Payer: 59 | Attending: Orthopedic Surgery | Admitting: Orthopedic Surgery

## 2019-07-10 ENCOUNTER — Other Ambulatory Visit: Payer: Self-pay

## 2019-07-10 ENCOUNTER — Inpatient Hospital Stay (HOSPITAL_COMMUNITY): Payer: 59 | Admitting: Certified Registered Nurse Anesthetist

## 2019-07-10 ENCOUNTER — Encounter (HOSPITAL_COMMUNITY): Payer: Self-pay | Admitting: Orthopedic Surgery

## 2019-07-10 ENCOUNTER — Encounter (HOSPITAL_COMMUNITY)
Admission: RE | Disposition: A | Discharge status: Home / Self Care | Payer: Self-pay | Source: Home / Self Care | Attending: Orthopedic Surgery

## 2019-07-10 DIAGNOSIS — Z20828 Contact with and (suspected) exposure to other viral communicable diseases: Secondary | ICD-10-CM | POA: Diagnosis present

## 2019-07-10 DIAGNOSIS — Z6841 Body Mass Index (BMI) 40.0 and over, adult: Secondary | ICD-10-CM | POA: Diagnosis not present

## 2019-07-10 DIAGNOSIS — M1711 Unilateral primary osteoarthritis, right knee: Principal | ICD-10-CM

## 2019-07-10 DIAGNOSIS — Z96652 Presence of left artificial knee joint: Secondary | ICD-10-CM | POA: Diagnosis present

## 2019-07-10 HISTORY — PX: TOTAL KNEE ARTHROPLASTY: SHX125

## 2019-07-10 SURGERY — ARTHROPLASTY, KNEE, TOTAL
Anesthesia: Regional | Site: Knee | Laterality: Right

## 2019-07-10 MED ORDER — FENTANYL CITRATE (PF) 250 MCG/5ML IJ SOLN
INTRAMUSCULAR | Status: AC
  Filled 2019-07-10: qty 5

## 2019-07-10 MED ORDER — CLONIDINE HCL (ANALGESIA) 100 MCG/ML EP SOLN
EPIDURAL | Status: DC | PRN
  Administered 2019-07-10: 100 ug

## 2019-07-10 MED ORDER — ACETAMINOPHEN 500 MG PO TABS
ORAL_TABLET | ORAL | Status: AC
  Filled 2019-07-10: qty 2

## 2019-07-10 MED ORDER — BUPIVACAINE LIPOSOME 1.3 % IJ SUSP
20.0000 mL | INTRAMUSCULAR | Status: DC
  Filled 2019-07-10: qty 20

## 2019-07-10 MED ORDER — PROPOFOL 500 MG/50ML IV EMUL
INTRAVENOUS | Status: DC | PRN
  Administered 2019-07-10 (×2): 100 ug/kg/min via INTRAVENOUS

## 2019-07-10 MED ORDER — PROPOFOL 1000 MG/100ML IV EMUL
INTRAVENOUS | Status: AC
  Filled 2019-07-10: qty 100

## 2019-07-10 MED ORDER — TRAZODONE HCL 50 MG PO TABS
50.0000 mg | ORAL_TABLET | Freq: Every day | ORAL | Status: DC
  Administered 2019-07-10: 50 mg via ORAL
  Filled 2019-07-10: qty 1

## 2019-07-10 MED ORDER — FENTANYL CITRATE (PF) 100 MCG/2ML IJ SOLN
INTRAMUSCULAR | Status: AC
  Filled 2019-07-10: qty 2

## 2019-07-10 MED ORDER — HYDROMORPHONE HCL 1 MG/ML IJ SOLN
0.2500 mg | INTRAMUSCULAR | Status: DC | PRN
  Administered 2019-07-10 (×2): 0.5 mg via INTRAVENOUS

## 2019-07-10 MED ORDER — LACTATED RINGERS IV SOLN: INTRAVENOUS | Status: DC

## 2019-07-10 MED ORDER — BUPROPION HCL ER (XL) 300 MG PO TB24
300.0000 mg | ORAL_TABLET | Freq: Every day | ORAL | Status: DC
  Administered 2019-07-10 – 2019-07-11 (×2): 300 mg via ORAL
  Filled 2019-07-10 (×2): qty 1

## 2019-07-10 MED ORDER — ASPIRIN 81 MG PO CHEW
81.0000 mg | CHEWABLE_TABLET | Freq: Two times a day (BID) | ORAL | Status: DC
  Administered 2019-07-10 – 2019-07-11 (×2): 81 mg via ORAL
  Filled 2019-07-10 (×2): qty 1

## 2019-07-10 MED ORDER — MORPHINE BOLUS VIA INFUSION: 2.0000 mg | INTRAVENOUS | Status: DC | PRN

## 2019-07-10 MED ORDER — DOCUSATE SODIUM 100 MG PO CAPS
100.0000 mg | ORAL_CAPSULE | Freq: Two times a day (BID) | ORAL | Status: DC
  Administered 2019-07-10 – 2019-07-11 (×2): 100 mg via ORAL
  Filled 2019-07-10 (×2): qty 1

## 2019-07-10 MED ORDER — ONDANSETRON HCL 4 MG/2ML IJ SOLN
INTRAMUSCULAR | Status: DC | PRN
  Administered 2019-07-10: 4 mg via INTRAVENOUS

## 2019-07-10 MED ORDER — SODIUM CHLORIDE 0.9% FLUSH
INTRAVENOUS | Status: DC | PRN
  Administered 2019-07-10: 20 mL

## 2019-07-10 MED ORDER — DEXAMETHASONE SODIUM PHOSPHATE 10 MG/ML IJ SOLN
INTRAMUSCULAR | Status: AC
  Filled 2019-07-10: qty 1

## 2019-07-10 MED ORDER — ACETAMINOPHEN 325 MG PO TABS: 325.0000 mg | ORAL_TABLET | Freq: Four times a day (QID) | ORAL | Status: DC | PRN

## 2019-07-10 MED ORDER — METOCLOPRAMIDE HCL 5 MG/ML IJ SOLN: 5.0000 mg | Freq: Three times a day (TID) | INTRAMUSCULAR | Status: DC | PRN

## 2019-07-10 MED ORDER — TRIAMTERENE-HCTZ 37.5-25 MG PO TABS
1.0000 | ORAL_TABLET | Freq: Every day | ORAL | Status: DC
  Administered 2019-07-11: 1 via ORAL
  Filled 2019-07-10 (×2): qty 1

## 2019-07-10 MED ORDER — ONDANSETRON HCL 4 MG/2ML IJ SOLN: 4.0000 mg | Freq: Four times a day (QID) | INTRAMUSCULAR | Status: DC | PRN

## 2019-07-10 MED ORDER — POVIDONE-IODINE 10 % EX SWAB
2.0000 "application " | Freq: Once | CUTANEOUS | Status: AC
  Administered 2019-07-10: 2 via TOPICAL

## 2019-07-10 MED ORDER — FENTANYL CITRATE (PF) 100 MCG/2ML IJ SOLN
INTRAMUSCULAR | Status: DC | PRN
  Administered 2019-07-10 (×2): 50 ug via INTRAVENOUS

## 2019-07-10 MED ORDER — PROMETHAZINE HCL 25 MG/ML IJ SOLN: 6.2500 mg | INTRAMUSCULAR | Status: DC | PRN

## 2019-07-10 MED ORDER — METHOCARBAMOL 1000 MG/10ML IJ SOLN
500.0000 mg | Freq: Four times a day (QID) | INTRAVENOUS | Status: DC | PRN
  Filled 2019-07-10: qty 5

## 2019-07-10 MED ORDER — MENTHOL 3 MG MT LOZG: 1.0000 | LOZENGE | OROMUCOSAL | Status: DC | PRN

## 2019-07-10 MED ORDER — BUPIVACAINE LIPOSOME 1.3 % IJ SUSP
INTRAMUSCULAR | Status: DC | PRN
  Administered 2019-07-10: 20 mL

## 2019-07-10 MED ORDER — OXYCODONE HCL 5 MG PO TABS: 5.0000 mg | ORAL_TABLET | ORAL | Status: DC | PRN

## 2019-07-10 MED ORDER — PHENOL 1.4 % MT LIQD: 1.0000 | OROMUCOSAL | Status: DC | PRN

## 2019-07-10 MED ORDER — MIDAZOLAM HCL 2 MG/2ML IJ SOLN
INTRAMUSCULAR | Status: AC
  Filled 2019-07-10: qty 2

## 2019-07-10 MED ORDER — MIDAZOLAM HCL 2 MG/2ML IJ SOLN
INTRAMUSCULAR | Status: AC
  Administered 2019-07-10: 2 mg via INTRAVENOUS
  Filled 2019-07-10: qty 2

## 2019-07-10 MED ORDER — ESCITALOPRAM OXALATE 10 MG PO TABS
10.0000 mg | ORAL_TABLET | Freq: Every day | ORAL | Status: DC
  Administered 2019-07-10: 10 mg via ORAL
  Filled 2019-07-10: qty 1

## 2019-07-10 MED ORDER — 0.9 % SODIUM CHLORIDE (POUR BTL) OPTIME
TOPICAL | Status: DC | PRN
  Administered 2019-07-10 (×5): 1000 mL

## 2019-07-10 MED ORDER — KETOROLAC TROMETHAMINE 30 MG/ML IJ SOLN: 30.0000 mg | Freq: Once | INTRAMUSCULAR | Status: DC | PRN

## 2019-07-10 MED ORDER — ONDANSETRON HCL 4 MG/2ML IJ SOLN
INTRAMUSCULAR | Status: AC
  Filled 2019-07-10: qty 2

## 2019-07-10 MED ORDER — SODIUM CHLORIDE 0.9 % IR SOLN
Status: DC | PRN
  Administered 2019-07-10: 3000 mL

## 2019-07-10 MED ORDER — PROPOFOL 10 MG/ML IV BOLUS
INTRAVENOUS | Status: DC | PRN
  Administered 2019-07-10: 10 mg via INTRAVENOUS
  Administered 2019-07-10 (×2): 20 mg via INTRAVENOUS

## 2019-07-10 MED ORDER — METHOCARBAMOL 500 MG PO TABS
500.0000 mg | ORAL_TABLET | Freq: Four times a day (QID) | ORAL | Status: DC | PRN
  Administered 2019-07-10 – 2019-07-11 (×2): 500 mg via ORAL
  Filled 2019-07-10 (×2): qty 1

## 2019-07-10 MED ORDER — TRAMADOL HCL 50 MG PO TABS
50.0000 mg | ORAL_TABLET | Freq: Four times a day (QID) | ORAL | Status: DC
  Administered 2019-07-11 (×2): 50 mg via ORAL
  Filled 2019-07-10 (×3): qty 1

## 2019-07-10 MED ORDER — CHLORHEXIDINE GLUCONATE 4 % EX LIQD: 60.0000 mL | Freq: Once | CUTANEOUS | Status: DC

## 2019-07-10 MED ORDER — IRRISEPT - 450ML BOTTLE WITH 0.05% CHG IN STERILE WATER, USP 99.95% OPTIME
TOPICAL | Status: DC | PRN
  Administered 2019-07-10: 15:00:00 450 mL

## 2019-07-10 MED ORDER — ALBUTEROL SULFATE HFA 108 (90 BASE) MCG/ACT IN AERS: 1.0000 | INHALATION_SPRAY | Freq: Four times a day (QID) | RESPIRATORY_TRACT | Status: DC | PRN

## 2019-07-10 MED ORDER — BUPIVACAINE IN DEXTROSE 0.75-8.25 % IT SOLN
INTRATHECAL | Status: DC | PRN
  Administered 2019-07-10: 2 mL via INTRATHECAL

## 2019-07-10 MED ORDER — HYDROCODONE-ACETAMINOPHEN 7.5-325 MG PO TABS
1.0000 | ORAL_TABLET | ORAL | Status: DC | PRN
  Administered 2019-07-11 (×3): 2 via ORAL
  Filled 2019-07-10 (×3): qty 2

## 2019-07-10 MED ORDER — HYDROMORPHONE HCL 1 MG/ML IJ SOLN
INTRAMUSCULAR | Status: AC
  Filled 2019-07-10: qty 1

## 2019-07-10 MED ORDER — BUPIVACAINE HCL (PF) 0.25 % IJ SOLN
INTRAMUSCULAR | Status: AC
  Filled 2019-07-10: qty 30

## 2019-07-10 MED ORDER — OXYCODONE HCL 5 MG PO TABS: 5.0000 mg | ORAL_TABLET | Freq: Once | ORAL | Status: DC | PRN

## 2019-07-10 MED ORDER — METOCLOPRAMIDE HCL 5 MG PO TABS: 5.0000 mg | ORAL_TABLET | Freq: Three times a day (TID) | ORAL | Status: DC | PRN

## 2019-07-10 MED ORDER — MORPHINE SULFATE (PF) 4 MG/ML IV SOLN
INTRAVENOUS | Status: DC | PRN
  Administered 2019-07-10: 8 mg via INTRAMUSCULAR

## 2019-07-10 MED ORDER — OXYCODONE HCL 5 MG/5ML PO SOLN: 5.0000 mg | Freq: Once | ORAL | Status: DC | PRN

## 2019-07-10 MED ORDER — BUPIVACAINE HCL 0.25 % IJ SOLN
INTRAMUSCULAR | Status: DC | PRN
  Administered 2019-07-10: 10 mL
  Administered 2019-07-10: 20 mL

## 2019-07-10 MED ORDER — ONDANSETRON HCL 4 MG PO TABS: 4.0000 mg | ORAL_TABLET | Freq: Four times a day (QID) | ORAL | Status: DC | PRN

## 2019-07-10 MED ORDER — MIDAZOLAM HCL 2 MG/2ML IJ SOLN: 2.0000 mg | Freq: Once | INTRAMUSCULAR | Status: AC

## 2019-07-10 MED ORDER — MORPHINE SULFATE (PF) 4 MG/ML IV SOLN
INTRAVENOUS | Status: AC
  Filled 2019-07-10: qty 2

## 2019-07-10 MED ORDER — TRANEXAMIC ACID 1000 MG/10ML IV SOLN
2000.0000 mg | INTRAVENOUS | Status: AC
  Administered 2019-07-10: 13:00:00 2000 mg via TOPICAL
  Filled 2019-07-10: qty 20

## 2019-07-10 MED ORDER — HYDROMORPHONE HCL 1 MG/ML IJ SOLN
0.5000 mg | INTRAMUSCULAR | Status: DC | PRN
  Administered 2019-07-10: 0.5 mg via INTRAVENOUS
  Filled 2019-07-10: qty 1

## 2019-07-10 MED ORDER — ROPIVACAINE HCL 5 MG/ML IJ SOLN
INTRAMUSCULAR | Status: DC | PRN
  Administered 2019-07-10: 30 mL via EPIDURAL

## 2019-07-10 MED ORDER — DEXAMETHASONE SODIUM PHOSPHATE 10 MG/ML IJ SOLN
INTRAMUSCULAR | Status: DC | PRN
  Administered 2019-07-10: 10 mg via INTRAVENOUS

## 2019-07-10 MED ORDER — ACETAMINOPHEN 500 MG PO TABS
1000.0000 mg | ORAL_TABLET | Freq: Once | ORAL | Status: AC
  Administered 2019-07-10: 1000 mg via ORAL

## 2019-07-10 MED ORDER — PROPOFOL 10 MG/ML IV BOLUS
INTRAVENOUS | Status: AC
  Filled 2019-07-10: qty 20

## 2019-07-10 MED ORDER — MORPHINE SULFATE (PF) 2 MG/ML IV SOLN
2.0000 mg | INTRAVENOUS | Status: DC | PRN
  Administered 2019-07-10 – 2019-07-11 (×4): 2 mg via INTRAVENOUS
  Filled 2019-07-10 (×4): qty 1

## 2019-07-10 MED ORDER — TRANEXAMIC ACID-NACL 1000-0.7 MG/100ML-% IV SOLN
INTRAVENOUS | Status: AC
  Filled 2019-07-10: qty 100

## 2019-07-10 MED ORDER — CELECOXIB 200 MG PO CAPS
200.0000 mg | ORAL_CAPSULE | Freq: Two times a day (BID) | ORAL | Status: DC
  Administered 2019-07-10 – 2019-07-11 (×2): 200 mg via ORAL
  Filled 2019-07-10 (×2): qty 1

## 2019-07-10 MED ORDER — CLONIDINE HCL (ANALGESIA) 100 MCG/ML EP SOLN
EPIDURAL | Status: AC
  Filled 2019-07-10: qty 10

## 2019-07-10 MED ORDER — PHENYLEPHRINE HCL-NACL 10-0.9 MG/250ML-% IV SOLN
INTRAVENOUS | Status: DC | PRN
  Administered 2019-07-10: 50 ug/min via INTRAVENOUS

## 2019-07-10 MED ORDER — CEFAZOLIN SODIUM-DEXTROSE 2-4 GM/100ML-% IV SOLN
2.0000 g | Freq: Four times a day (QID) | INTRAVENOUS | Status: AC
  Administered 2019-07-10 – 2019-07-11 (×2): 2 g via INTRAVENOUS
  Filled 2019-07-10 (×2): qty 100

## 2019-07-10 SURGICAL SUPPLY — 84 items
BAG DECANTER FOR FLEXI CONT (MISCELLANEOUS) ×2 IMPLANT
BANDAGE ESMARK 6X9 LF (GAUZE/BANDAGES/DRESSINGS) ×1 IMPLANT
BLADE SAG 18X100X1.27 (BLADE) ×2 IMPLANT
BNDG CMPR 9X6 STRL LF SNTH (GAUZE/BANDAGES/DRESSINGS) ×1
BNDG CMPR MED 15X6 ELC VLCR LF (GAUZE/BANDAGES/DRESSINGS) ×1
BNDG COHESIVE 6X5 TAN STRL LF (GAUZE/BANDAGES/DRESSINGS) ×2 IMPLANT
BNDG ELASTIC 4X5.8 VLCR STR LF (GAUZE/BANDAGES/DRESSINGS) ×1 IMPLANT
BNDG ELASTIC 6X15 VLCR STRL LF (GAUZE/BANDAGES/DRESSINGS) ×2 IMPLANT
BNDG ELASTIC 6X5.8 VLCR STR LF (GAUZE/BANDAGES/DRESSINGS) ×1 IMPLANT
BNDG ESMARK 6X9 LF (GAUZE/BANDAGES/DRESSINGS) ×2
BOWL SMART MIX CTS (DISPOSABLE) IMPLANT
BSPLAT TIB 5 KN TRITANIUM (Knees) ×1 IMPLANT
CONT SPEC 4OZ CLIKSEAL STRL BL (MISCELLANEOUS) ×2 IMPLANT
COVER SURGICAL LIGHT HANDLE (MISCELLANEOUS) ×2 IMPLANT
COVER WAND RF STERILE (DRAPES) ×2 IMPLANT
CUFF TOURN SGL QUICK 34 (TOURNIQUET CUFF) ×2
CUFF TOURN SGL QUICK 42 (TOURNIQUET CUFF) IMPLANT
CUFF TRNQT CYL 34X4.125X (TOURNIQUET CUFF) ×1 IMPLANT
DECANTER SPIKE VIAL GLASS SM (MISCELLANEOUS) ×2 IMPLANT
DRAPE INCISE IOBAN 66X45 STRL (DRAPES) IMPLANT
DRAPE ORTHO SPLIT 77X108 STRL (DRAPES) ×6
DRAPE SURG ORHT 6 SPLT 77X108 (DRAPES) ×3 IMPLANT
DRAPE U-SHAPE 47X51 STRL (DRAPES) ×2 IMPLANT
DRSG AQUACEL AG ADV 3.5X10 (GAUZE/BANDAGES/DRESSINGS) ×1 IMPLANT
DRSG AQUACEL AG ADV 3.5X14 (GAUZE/BANDAGES/DRESSINGS) IMPLANT
DRSG XEROFORM 1X8 (GAUZE/BANDAGES/DRESSINGS) ×1 IMPLANT
DURAPREP 26ML APPLICATOR (WOUND CARE) ×4 IMPLANT
ELECT CAUTERY BLADE 6.4 (BLADE) ×2 IMPLANT
ELECT REM PT RETURN 9FT ADLT (ELECTROSURGICAL) ×2
ELECTRODE REM PT RTRN 9FT ADLT (ELECTROSURGICAL) ×1 IMPLANT
GAUZE SPONGE 4X4 12PLY STRL (GAUZE/BANDAGES/DRESSINGS) ×2 IMPLANT
GAUZE SPONGE 4X4 12PLY STRL LF (GAUZE/BANDAGES/DRESSINGS) ×1 IMPLANT
GLOVE BIOGEL PI IND STRL 7.0 (GLOVE) ×1 IMPLANT
GLOVE BIOGEL PI IND STRL 8 (GLOVE) ×1 IMPLANT
GLOVE BIOGEL PI INDICATOR 7.0 (GLOVE) ×1
GLOVE BIOGEL PI INDICATOR 8 (GLOVE) ×1
GLOVE ECLIPSE 7.0 STRL STRAW (GLOVE) ×2 IMPLANT
GLOVE ECLIPSE 8.0 STRL XLNG CF (GLOVE) ×2 IMPLANT
GOWN STRL REUS W/ TWL LRG LVL3 (GOWN DISPOSABLE) ×3 IMPLANT
GOWN STRL REUS W/TWL LRG LVL3 (GOWN DISPOSABLE) ×6
HANDPIECE INTERPULSE COAX TIP (DISPOSABLE) ×2
HOOD PEEL AWAY FLYTE STAYCOOL (MISCELLANEOUS) ×6 IMPLANT
IMMOBILIZER KNEE 20 (SOFTGOODS)
IMMOBILIZER KNEE 20 THIGH 36 (SOFTGOODS) IMPLANT
IMMOBILIZER KNEE 22 UNIV (SOFTGOODS) ×1 IMPLANT
IMMOBILIZER KNEE 24 THIGH 36 (MISCELLANEOUS) IMPLANT
IMMOBILIZER KNEE 24 UNIV (MISCELLANEOUS)
INSERT TIB BEARING SZ 5 10 (Insert) ×1 IMPLANT
KIT BASIN OR (CUSTOM PROCEDURE TRAY) ×2 IMPLANT
KIT TURNOVER KIT B (KITS) ×2 IMPLANT
KNEE FEMORAL COMP RT RETAIN (Knees) ×1 IMPLANT
KNEE PATELLA ASYMMETRIC 10X35 (Knees) ×1 IMPLANT
KNEE TIBIAL COMPONENT SZ5 (Knees) ×1 IMPLANT
MANIFOLD NEPTUNE II (INSTRUMENTS) ×2 IMPLANT
NDL 18GX1X1/2 (RX/OR ONLY) (NEEDLE) IMPLANT
NDL SPNL 18GX3.5 QUINCKE PK (NEEDLE) ×1 IMPLANT
NEEDLE 18GX1X1/2 (RX/OR ONLY) (NEEDLE) ×2 IMPLANT
NEEDLE 22X1 1/2 (OR ONLY) (NEEDLE) ×4 IMPLANT
NEEDLE SPNL 18GX3.5 QUINCKE PK (NEEDLE) ×2 IMPLANT
NS IRRIG 1000ML POUR BTL (IV SOLUTION) ×4 IMPLANT
PACK TOTAL JOINT (CUSTOM PROCEDURE TRAY) ×2 IMPLANT
PAD ARMBOARD 7.5X6 YLW CONV (MISCELLANEOUS) ×4 IMPLANT
PAD CAST 4YDX4 CTTN HI CHSV (CAST SUPPLIES) ×1 IMPLANT
PADDING CAST COTTON 4X4 STRL (CAST SUPPLIES) ×2
PADDING CAST COTTON 6X4 STRL (CAST SUPPLIES) ×2 IMPLANT
PIN FLUTED HEDLESS FIX 3.5X1/8 (PIN) ×1 IMPLANT
SET HNDPC FAN SPRY TIP SCT (DISPOSABLE) ×1 IMPLANT
SPONGE LAP 18X18 X RAY DECT (DISPOSABLE) ×3 IMPLANT
STRIP CLOSURE SKIN 1/2X4 (GAUZE/BANDAGES/DRESSINGS) ×4 IMPLANT
SUCTION FRAZIER HANDLE 10FR (MISCELLANEOUS) ×1
SUCTION TUBE FRAZIER 10FR DISP (MISCELLANEOUS) ×1 IMPLANT
SUT MNCRL AB 3-0 PS2 18 (SUTURE) ×2 IMPLANT
SUT VIC AB 0 CT1 27 (SUTURE) ×6
SUT VIC AB 0 CT1 27XBRD ANBCTR (SUTURE) ×3 IMPLANT
SUT VIC AB 1 CT1 27 (SUTURE) ×12
SUT VIC AB 1 CT1 27XBRD ANBCTR (SUTURE) ×5 IMPLANT
SUT VIC AB 2-0 CT1 27 (SUTURE) ×8
SUT VIC AB 2-0 CT1 TAPERPNT 27 (SUTURE) ×4 IMPLANT
SYR 30ML LL (SYRINGE) ×6 IMPLANT
SYR TB 1ML LUER SLIP (SYRINGE) ×3 IMPLANT
TOWEL GREEN STERILE (TOWEL DISPOSABLE) ×4 IMPLANT
TOWEL GREEN STERILE FF (TOWEL DISPOSABLE) ×4 IMPLANT
TRAY CATH 16FR W/PLASTIC CATH (SET/KITS/TRAYS/PACK) IMPLANT
WATER STERILE IRR 1000ML POUR (IV SOLUTION) IMPLANT

## 2019-07-10 NOTE — Anesthesia Procedure Notes (Addendum)
Anesthesia Regional Block: Adductor canal block   Pre-Anesthetic Checklist: ,, timeout performed, Correct Patient, Correct Site, Correct Laterality, Correct Procedure,, site marked, risks and benefits discussed, Surgical consent,  Pre-op evaluation,  At surgeon's request and post-op pain management  Laterality: Right  Prep: chloraprep       Needles:  Injection technique: Single-shot  Needle Type: Echogenic Stimulator Needle     Needle Length: 10cm  Needle Gauge: 21     Additional Needles:   Procedures:,,,, ultrasound used (permanent image in chart),,,,  Narrative:  Start time: 07/10/2019 11:50 AM End time: 07/10/2019 12:00 PM Injection made incrementally with aspirations every 5 mL.  Performed by: Personally  Anesthesiologist: Murvin Natal, MD  Additional Notes: Functioning IV was confirmed and monitors were applied. A time-out was performed. Hand hygiene and sterile gloves were used. The thigh was placed in a frog-leg position and prepped in a sterile fashion. A 169mm 21ga Pajunk echogenic stimulator needle was placed using ultrasound guidance.  Negative aspiration and negative test dose prior to incremental administration of local anesthetic. The patient tolerated the procedure well.

## 2019-07-10 NOTE — Anesthesia Preprocedure Evaluation (Addendum)
Anesthesia Evaluation  Patient identified by MRN, date of birth, ID band Patient awake    Reviewed: Allergy & Precautions, NPO status , Patient's Chart, lab work & pertinent test results  Airway Mallampati: II  TM Distance: >3 FB Neck ROM: Full    Dental no notable dental hx. (+) Teeth Intact, Dental Advisory Given   Pulmonary asthma ,    Pulmonary exam normal breath sounds clear to auscultation       Cardiovascular negative cardio ROS Normal cardiovascular exam Rhythm:Regular Rate:Normal     Neuro/Psych PSYCHIATRIC DISORDERS Depression negative neurological ROS     GI/Hepatic negative GI ROS, Neg liver ROS,   Endo/Other  Morbid obesity  Renal/GU negative Renal ROS     Musculoskeletal negative musculoskeletal ROS (+)   Abdominal (+) + obese,   Peds  Hematology negative hematology ROS (+)   Anesthesia Other Findings right knee osteoarthritis  Reproductive/Obstetrics                           Anesthesia Physical Anesthesia Plan  ASA: III  Anesthesia Plan: Spinal and Regional   Post-op Pain Management:  Regional for Post-op pain   Induction: Intravenous  PONV Risk Score and Plan: 2 and Ondansetron, Dexamethasone, Midazolam and Treatment may vary due to age or medical condition  Airway Management Planned: Simple Face Mask  Additional Equipment:   Intra-op Plan:   Post-operative Plan:   Informed Consent: I have reviewed the patients History and Physical, chart, labs and discussed the procedure including the risks, benefits and alternatives for the proposed anesthesia with the patient or authorized representative who has indicated his/her understanding and acceptance.     Dental advisory given  Plan Discussed with: CRNA  Anesthesia Plan Comments:         Anesthesia Quick Evaluation

## 2019-07-10 NOTE — Anesthesia Procedure Notes (Signed)
Spinal  Patient location during procedure: OR Start time: 07/10/2019 12:35 PM End time: 07/10/2019 12:45 PM Staffing Performed: anesthesiologist  Anesthesiologist: Murvin Natal, MD Preanesthetic Checklist Completed: patient identified, IV checked, risks and benefits discussed, surgical consent, monitors and equipment checked, pre-op evaluation and timeout performed Spinal Block Patient position: sitting Prep: DuraPrep Patient monitoring: cardiac monitor, continuous pulse ox and blood pressure Approach: midline Location: L3-4 Injection technique: single-shot Needle Needle type: Pencan  Needle gauge: 24 G Needle length: 9 cm Assessment Sensory level: T10 Additional Notes Functioning IV was confirmed and monitors were applied. Sterile prep and drape, including hand hygiene and sterile gloves were used. The patient was positioned and the spine was prepped. The skin was anesthetized with lidocaine.  Free flow of clear CSF was obtained on the second attempt prior to injecting local anesthetic into the CSF.  The spinal needle aspirated freely following injection.  The needle was carefully withdrawn.  The patient tolerated the procedure well.

## 2019-07-10 NOTE — Op Note (Signed)
NAME: Mary Morrison, Mary Morrison MEDICAL RECORD [id number] ACCOUNT [id number] DATE OF BIRTH:[birth year] FACILITY: [facility] LOCATION: [facility] PHYSICIAN: Randel Pigg, MD  OPERATIVE REPORT  DATE OF PROCEDURE:  07/10/2019  PREOPERATIVE DIAGNOSIS:  Right knee arthritis.  POSTOPERATIVE DIAGNOSIS:  Right knee arthritis.  PROCEDURE:  Right total knee arthroplasty using [employer] Triathlon pressfit size 4 femur, 5 tibia, posterior cruciate retaining size 10 mm deep dish polyethylene insert, 35 mm 3-peg patella pressfit.  SURGEON:  Meredith Pel, MD  ASSISTANT:  Annie Main, PA-C   INDICATIONS:  The patient is a 53 year old patient with end-stage right knee arthritis, who presents for operative management after explanation of risks and benefits.  PROCEDURE IN DETAIL:  The patient was brought to the operating room where general endotracheal anesthesia was induced.  Preoperative antibiotics were administered.  Timeout was called.  The right knee was pre-scrubbed with alcohol and Betadine, allowed  to air dry, prepped with DuraPrep solution and draped in sterile manner.  A spinal anesthetic was induced.  The right leg was pre-scrubbed with alcohol and Betadine, allowed to air dry, prepped with DuraPrep solution and draped in a sterile manner.   Ioban used to cover the operative field.  Leg was elevated, exsanguinated with the Esmarch wrap.  Tourniquet was inflated.  Total tourniquet time 90 minutes at 325 mmHg.  Anterior approach to knee was made.  Skin and subcutaneous tissue were sharply  divided after exsanguinating the leg with an Esmarch wrap.  Median parapatellar approach was made and marked with a #1 Vicryl suture.  Soft tissue removed from the anterior distal femur.  Lateral patellofemoral ligament was released.  Synovitis was  present.  Minimal soft tissue dissection was performed on the medial side.  Fat pad was partially removed.  Patella was everted.  The knee was flexed.  The patient had  severe arthritis, both in the patellofemoral joint as well as the medial compartment.   ACL was released.  Anterior horn of the lateral meniscus was released.  The tibial plateau was exposed.  Intramedullary alignment was then used to make a cut perpendicular to mechanical axis of the tibia.  This was based on 9 mm cut off the least  affected lateral tibial plateau.  Collaterals and posterior neurovascular structures were protected.  The cut was made.  Attention was then directed towards the femur.  An 8 mm cut was then made using intramedullary alignment in 5 degrees of valgus.   This allowed for a 9 mm spacer block to fit well within the cut bony surfaces.  The femur was sized to a size 4.  The anterior, posterior and chamfer cuts were made.  The tibia was then keel punched and final preparation was made.  Trial reduction was  performed after cutting the patella down from 27 to 14.   The patellar trial was placed.  At this time with trial components in position, the patient had a full flexion, full extension and about 3 degrees of hyperextension and good stability to varus and  valgus stress at 0, 30 and 90 degrees.  Patella tracked very nicely with a no-thumbs technique.  Trial components were removed.  Thorough irrigation was performed with 3 L of irrigating solution, followed by tranexamic acid, followed by anesthetizing  the capsule using Exparel, Marcaine, saline.  Following this, the true components were tapped into position.  Bone quality was excellent.  A very good pressfit was obtained.  Next, the tourniquet was released.  Bleeding points encountered were controlled  using  electrocautery.  Pouring irrigation was then utilized.  The arthrotomy was then closed over a bolster using #1 Vicryl suture, followed by interrupted inverted 0 Vicryl suture, 2-0 Vicryl suture and a 3-0 Monocryl.  Steri-Strips and Aquacel  dressing applied.  Knee immobilizer applied.  A solution of Marcaine, morphine, clonidine  injected into the knee joint prior to placing the Steri-Strips.  This was for postop pain relief.  The patient tolerated the procedure well without immediate  complications.  Luke's assistance was required at all times for opening and closing, retraction.  His assistance was a medical necessity.  The patient was transferred to the recovery room in stable condition.  VN/NUANCE  D:07/10/2019 T:07/10/2019 JOB:[id number]

## 2019-07-10 NOTE — H&P (Signed)
TOTAL KNEE ADMISSION H&P  Patient is being admitted for right total knee arthroplasty.  Subjective:  Chief Complaint:right knee pain.  HPI: Mary Morrison, 53 y.o. female, has a history of pain and functional disability in the right knee due to arthritis and has failed non-surgical conservative treatments for greater than 12 weeks to includeNSAID's and/or analgesics, corticosteriod injections, viscosupplementation injections, use of assistive devices and activity modification.  Onset of symptoms was gradual, starting 4 years ago with rapidlly worsening course since that time. The patient noted prior procedures on the knee to include  Arthroscopy with meniscal root repair on the medial side on the right knee(s).  Patient currently rates pain in the right knee(s) at 8 out of 10 with activity. Patient has night pain, worsening of pain with activity and weight bearing, pain that interferes with activities of daily living, pain with passive range of motion, crepitus and joint swelling.  Patient has evidence of subchondral sclerosis and joint space narrowing by imaging studies. This patient has had A good result with her left total knee replacement.  She did have a difficult recovery from that surgery.  This was essentially regarding pain control issues.. There is no active infection.  Patient Active Problem List   Diagnosis Date Noted  . Arthritis of knee 07/03/2017  . Varicose veins of bilateral lower extremities with other complications [id number]  . Acute pain of left knee 01/25/2017  . Swelling of left knee joint 01/25/2017  . S/P hysterectomy 09/21/2014  . Lapband APS July 2015 02/17/2014  . Edema-chronic pitting in legs 01/20/2014  . Morbid obesity ([place]) 08/28/2013   Past Medical History:  Diagnosis Date  . Arthritis   . Asthma    EXERCISED INDUCED 2014   . Depression   . Fibroids   . Obesity   . Seasonal allergies   . Varicose vein of leg     Past Surgical History:  Procedure  Laterality Date  . BILATERAL SALPINGECTOMY Bilateral 09/21/2014   Procedure: BILATERAL SALPINGECTOMY;  Surgeon: Marvene Staff, MD;  Location: [facility];  Service: Gynecology;  Laterality: Bilateral;  . CESAREAN SECTION  1989  . COLONOSCOPY    . LAPAROSCOPIC GASTRIC BANDING N/A 02/17/2014   Procedure: LAPAROSCOPIC GASTRIC BANDING;  Surgeon: Pedro Earls, MD;  Location: [facility];  Service: General;  Laterality: N/A;  . ROBOT ASSISTED MYOMECTOMY N/A 09/21/2014   Procedure: ROBOTIC ASSISTED MYOMECTOMY;  Surgeon: Marvene Staff, MD;  Location: [facility];  Service: Gynecology;  Laterality: N/A;  . ROBOTIC ASSISTED TOTAL HYSTERECTOMY N/A 09/21/2014   Procedure: ROBOTIC ASSISTED TOTAL HYSTERECTOMY, EXCISION OF PELVIC ENDOMETRIOSIS;  Surgeon: Marvene Staff, MD;  Location: [facility];  Service: Gynecology;  Laterality: N/A;  . TONSILLECTOMY    . TOTAL KNEE ARTHROPLASTY Left 07/03/2017   Procedure: LEFT TOTAL KNEE ARTHROPLASTY;  Surgeon: Meredith Pel, MD;  Location: [facility];  Service: Orthopedics;  Laterality: Left;    Current Facility-Administered Medications  Medication Dose Route Frequency Provider Last Rate Last Admin  . acetaminophen (TYLENOL) 500 MG tablet           . bupivacaine liposome (EXPAREL) 1.3 % injection 266 mg  20 mL Infiltration To OR Meredith Pel, MD      . ceFAZolin (ANCEF) 3 g in dextrose 5 % 50 mL IVPB  3 g Intravenous To SS-Surg Meredith Pel, MD      . chlorhexidine (HIBICLENS) 4 % liquid 4 application  60 mL Topical Once Magnant, Charles L, PA-C      .  chlorhexidine (HIBICLENS) 4 % liquid 4 application  60 mL Topical Once Magnant, Charles L, PA-C      . fentaNYL (SUBLIMAZE) 100 MCG/2ML injection           . lactated ringers infusion   Intravenous Continuous Ellender, Karyl Kinnier, MD 10 mL/hr at 07/10/19 1108 New Bag at 07/10/19 1108  . midazolam (VERSED) 2 MG/2ML injection           . tranexamic acid (CYKLOKAPRON) 2,000 mg in sodium chloride 0.9 % 50 mL  Topical Application  [id number] mg Topical To OR Meredith Pel, MD      . tranexamic acid (CYKLOKAPRON) IVPB 1,000 mg  1,000 mg Intravenous To OR Marlou Sa Tonna Corner, MD       No Known Allergies  Social History   Tobacco Use  . Smoking status: Never Smoker  . Smokeless tobacco: Never Used  Substance Use Topics  . Alcohol use: No    Family History  Problem Relation Age of Onset  . Cancer Mother        breast/bilateral mastectomies  . Diabetes Other   . Hypertension Other   . Stroke Other   . Obesity Other      Review of Systems  Musculoskeletal: Positive for arthralgias.  All other systems reviewed and are negative.   Objective:  Physical Exam  Constitutional: She appears well-developed.  HENT:  Head: Normocephalic.  Eyes: Pupils are equal, round, and reactive to light.  Cardiovascular: Normal rate.  Respiratory: Effort normal.  Musculoskeletal:     Cervical back: Normal range of motion.  Neurological: She is alert.  Skin: Skin is warm.  Psychiatric: She has a normal mood and affect.  Examination of the right knee demonstrates trace effusion and medial greater than lateral joint line tenderness.  Extensor mechanism is intact.  She has about 95 degrees of flexion.  Does come out the full extension.  Pedal pulses palpable.  No skin changes around the knee region.  No groin pain on the right with internal extra rotation of the leg.  Well-healed surgical incision from prior arthroscopic surgery noted in the proximal mid tibial region.  Vital signs in last 24 hours: Temp:  [98.2 F (36.8 C)] 98.2 F (36.8 C) (12/17 1039) Pulse Rate:  [74] 74 (12/17 1039) Resp:  [17] 17 (12/17 1039) BP: (148)/(96) 148/96 (12/17 1039) SpO2:  [98 %] 98 % (12/17 1039) Weight:  [120.2 kg] 120.2 kg (12/17 1039)  Labs:   Estimated body mass index is 42.77 kg/m as calculated from the following:   Height as of this encounter: 5\' 6"  (1.676 m).   Weight as of this encounter: 120.2  kg.   Imaging Review Plain radiographs demonstrate moderate degenerative joint disease of the right knee(s). The overall alignment isneutral. The bone quality appears to be good for age and reported activity level.      Assessment/Plan:  End stage arthritis, right knee   The patient history, physical examination, clinical judgment of the provider and imaging studies are consistent with end stage degenerative joint disease of the right knee(s) and total knee arthroplasty is deemed medically necessary. The treatment options including medical management, injection therapy arthroscopy and arthroplasty were discussed at length. The risks and benefits of total knee arthroplasty were presented and reviewed. The risks due to aseptic loosening, infection, stiffness, patella tracking problems, thromboembolic complications and other imponderables were discussed. The patient acknowledged the explanation, agreed to proceed with the plan and consent was signed. Patient is being  admitted for inpatient treatment for surgery, pain control, PT, OT, prophylactic antibiotics, VTE prophylaxis, progressive ambulation and ADL's and discharge planning. The patient is planning to be discharged to skilled nursing facility    Anticipated LOS equal to or greater than 2 midnights due to - Age 63 and older with one or more of the following:  - Obesity  - Expected need for hospital services (PT, OT, Nursing) required for safe  discharge  - Anticipated need for postoperative skilled nursing care or inpatient rehab  - Active co-morbidities: None OR   - Unanticipated findings during/Post Surgery: None  - Patient is a high risk of re-admission due to: None

## 2019-07-10 NOTE — Progress Notes (Signed)
Orthopedic Tech Progress Note Patient Details:  Mary Morrison July 29, 1965 [id number]  CPM Right Knee CPM Right Knee: On Right Knee Flexion (Degrees): 10 Right Knee Extension (Degrees): 40 Additional Comments: applied ohf to bed    Ortho Devices Type of Ortho Device: CPM padding, Bone foam zero knee Ortho Device/Splint Location: RLE       Braulio Bosch 07/10/2019, 4:41 PM

## 2019-07-10 NOTE — Brief Op Note (Signed)
   07/10/2019  3:46 PM  PATIENT:  Mary Morrison  53 y.o. female  PRE-OPERATIVE DIAGNOSIS:  right knee osteoarthritis  POST-OPERATIVE DIAGNOSIS:  right knee osteoarthritis  PROCEDURE:  Procedure(s): RIGHT TOTAL KNEE ARTHROPLASTY  SURGEON:  Surgeon(s): Marlou Sa, Tonna Corner, MD  ASSISTANT: magnant pa  ANESTHESIA:   spinal  EBL: 75 ml    Total I/O In: 1150 [I.V.:1000; IV Piggyback:150] Out: [id number] [Urine:700; Blood:75]  BLOOD ADMINISTERED: none  DRAINS: none   LOCAL MEDICATIONS USED:  Marcaine mso4 clonidinee  SPECIMEN:  No Specimen  COUNTS:  YES  TOURNIQUET:   Total Tourniquet Time Documented: Thigh (Right) - 10 minutes Thigh (Right) - 90 minutes Total: Thigh (Right) - 100 minutes   DICTATION: .Other Dictation: Dictation Number [id number]  PLAN OF CARE: Admit to inpatient   PATIENT DISPOSITION:  PACU - hemodynamically stable

## 2019-07-10 NOTE — Progress Notes (Signed)
Pt requested morphine for pain and and trazadon for sleep. Pt states dilaudid does not work for her. Pt states she does not want to take oxycodone due to a family issue. I contacted [facility] and spoke with Tiffany, currently waiting on a call back for advisement. Will continue to monitor.

## 2019-07-10 NOTE — Transfer of Care (Signed)
Immediate Anesthesia Transfer of Care Note  Patient: Mary Morrison  Procedure(s) Performed: RIGHT TOTAL KNEE ARTHROPLASTY (Right Knee)  Patient Location: PACU  Anesthesia Type:MAC and Spinal  Level of Consciousness: awake, alert , oriented and patient cooperative  Airway & Oxygen Therapy: Patient Spontanous Breathing and Patient connected to face mask oxygen  Post-op Assessment: Report given to RN and Post -op Vital signs reviewed and stable  Post vital signs: Reviewed  Last Vitals:  Vitals Value Taken Time  BP 120/68 07/10/19 1610  Temp    Pulse 70 07/10/19 1612  Resp 13 07/10/19 1612  SpO2 96 % 07/10/19 1612  Vitals shown include unvalidated device data.  Last Pain:  Vitals:   07/10/19 1050  TempSrc:   PainSc: 4          Complications: No apparent anesthesia complications

## 2019-07-10 NOTE — Anesthesia Procedure Notes (Signed)
Procedure Name: MAC Date/Time: 07/10/2019 12:40 PM Performed by: Jenne Campus, CRNA Pre-anesthesia Checklist: Patient identified, Emergency Drugs available, Suction available, Patient being monitored and Timeout performed Oxygen Delivery Method: Simple face mask

## 2019-07-11 ENCOUNTER — Other Ambulatory Visit: Payer: Self-pay | Admitting: Surgical

## 2019-07-11 ENCOUNTER — Encounter: Payer: Self-pay | Admitting: *Deleted

## 2019-07-11 ENCOUNTER — Telehealth: Payer: Self-pay | Admitting: Orthopedic Surgery

## 2019-07-11 ENCOUNTER — Telehealth: Payer: Self-pay | Admitting: Physician Assistant

## 2019-07-11 MED ORDER — ASPIRIN 81 MG PO CHEW: 81.0000 mg | CHEWABLE_TABLET | Freq: Two times a day (BID) | ORAL | 0 refills | Status: DC

## 2019-07-11 MED ORDER — MORPHINE SULFATE 15 MG PO TABS: 15.0000 mg | ORAL_TABLET | ORAL | 0 refills | Status: DC | PRN

## 2019-07-11 MED ORDER — METHOCARBAMOL 500 MG PO TABS: 500.0000 mg | ORAL_TABLET | Freq: Three times a day (TID) | ORAL | 0 refills | Status: DC | PRN

## 2019-07-11 MED ORDER — IBUPROFEN 800 MG PO TABS: 800.0000 mg | ORAL_TABLET | Freq: Three times a day (TID) | ORAL | 0 refills | Status: DC | PRN

## 2019-07-11 NOTE — Plan of Care (Signed)

## 2019-07-11 NOTE — Plan of Care (Signed)

## 2019-07-11 NOTE — Progress Notes (Signed)
  Subjective: Patient stable.  Pain reasonably well controlled.  Is tolerating CPM   Objective: Vital signs in last 24 hours: Temp:  [97.7 F (36.5 C)-98.3 F (36.8 C)] 98.3 F (36.8 C) (12/18 0302) Pulse Rate:  [66-77] 73 (12/18 0302) Resp:  [9-19] 17 (12/18 0302) BP: (119-150)/(62-96) 122/76 (12/18 0302) SpO2:  [92 %-99 %] 98 % (12/18 0302) Weight:  [120.2 kg] 120.2 kg (12/17 1039)  Intake/Output from previous day: 12/17 0701 - 12/18 0700 In: 1550 [I.V.:1400; IV Piggyback:150] Out: 2675 [Urine:2600; Blood:75] Intake/Output this shift: No intake/output data recorded.  Exam:  Sensation intact distally Dorsiflexion/Plantar flexion intact  Labs: No results for input(s): HGB in the last 72 hours. No results for input(s): WBC, RBC, HCT, PLT in the last 72 hours. No results for input(s): NA, K, CL, CO2, BUN, CREATININE, GLUCOSE, CALCIUM in the last 72 hours. No results for input(s): LABPT, INR in the last 72 hours.  Assessment/Plan: Plan at this time is physical therapy this morning and this afternoon.  Anticipate discharge home this afternoon.  She will need home health care set up to begin this weekend or at the latest on Monday.  Patient has home CPM machine.  Patient also has her son at home with her who will stay with her for a week.   G Scott  07/11/2019, 8:10 AM

## 2019-07-11 NOTE — Progress Notes (Signed)
Provided discharge education/instructions, all questions and concerns addressed, Pt not in distress, discharged home with belongings accompanied by son.

## 2019-07-11 NOTE — Telephone Encounter (Signed)
Patient called requesting refill on medications. Patient stated to have had knee replacement surgery and medication needs to be sent to closer pharmacy. Patient is requesting refills on Hydrocodone and Hydromorphin in pill form for Hydromophine. Patient states this is urgent request. Patient also requesting a call back immediately. Patient phone number is  [phone].

## 2019-07-11 NOTE — Telephone Encounter (Signed)
See other note for further details.

## 2019-07-11 NOTE — Telephone Encounter (Signed)
Received call from [facility] with [facility] he advised do not have Rx Morphine 15 mg tab at his location. Mary Morrison asked if the Rx can be sent to the [facility] at [place]? Ph# [phone]     The number to contact Mary Morrison is [phone]

## 2019-07-11 NOTE — Evaluation (Signed)
Physical Therapy Evaluation Patient Details Name: Mary Morrison MRN: [id number] DOB: Dec 03, 1965 Today's Date: 07/11/2019   History of Present Illness  53 y/o female s/p R TKA on 07/10/19, pmh includes L TKA in 2018, arthritis, depression, obesity  Clinical Impression  Pt pleasant female admitted s/p R TKA. Pt eager to participate with PT. Pt typically lives alone but her son is with her from out of town for the first week following surgery to provide assistance. Pt has all necessary equipment at home including RW, 3in1, shower chair and CPM machine. Pt required min guard assist for all functional mobility. Pt tolerated standing without UE support ~8 min for self care at sink. Pt ambulated out in hall and up/down 2 steps with R HR. Pt safe with mobility with min guard assist. Pt has a ramp at home and lives on main level and will not have to do stairs at home. Pt educated on HEP with pt verbalizing understanding also reporting she has done all the exercises after her L TKA a few years ago and still has an exercise booklet. Pt reporting 8/10 pain after session with nursing notified of request for pain medication. pts iv came undone while pt setting up in recliner to end session with nursing immediately notified and towel applied to stop bleeding. VSS t/o session. Pt presents with decreased R knee ROM -5-70, strength, balance and activity tolerance consistent with recent surgery. Pt will benefit from skilled acute PT to improve deficits and safety with mobility. Recommendation for home health PT following discharge to further maximize functional mobility, increase independence and allow for return to PLOF.     Follow Up Recommendations Home health PT;Follow surgeon's recommendation for DC plan and follow-up therapies    Equipment Recommendations  None recommended by PT(pt already owns necessary DME)    Recommendations for Other Services       Precautions / Restrictions  Precautions Precautions: Fall Restrictions Weight Bearing Restrictions: Yes RLE Weight Bearing: Weight bearing as tolerated      Mobility  Bed Mobility Overal bed mobility: Needs Assistance Bed Mobility: Supine to Sit     Supine to sit: Min guard;HOB elevated     General bed mobility comments: min guard for safety, cuing for technique, pt utilizing left LE to assist RLE for advancement to EOB, no physical assist required, pt utilized increased time and effort  Transfers Overall transfer level: Needs assistance Equipment used: Rolling walker (2 wheeled) Transfers: Sit to/from Stand Sit to Stand: Min guard         General transfer comment: min guard for safety, cuing for hand placement and use of RW with good carryover, x2 from bed, x1 from [facility] over toilet, steady with transfer  Ambulation/Gait Ambulation/Gait assistance: Min guard Gait Distance (Feet): 180 Feet Assistive device: Rolling walker (2 wheeled) Gait Pattern/deviations: Step-to pattern;Decreased stride length;Antalgic;Trunk flexed;Decreased weight shift to right Gait velocity: decreased   General Gait Details: pt ambulated in hall to stairwell and back and in room to restroom, pt with increased reliance on UE support from RW, mildly antalgic gait with dec weight acceptance on RLE, pt steady and caitious, no buckling or LOB noted  Stairs Stairs: Yes Stairs assistance: Min guard Stair Management: One rail Right;Forwards;With walker Number of Stairs: 2 General stair comments: pt educated on up with the good down with the bad technique as well as sideways with B UE on handrail and forwards and backwards with RW, pt ascended/descended 2 stairs with RHR safely with RW, pt  does not have to do any stairs at home, min guard for safety however no overt LOB noted during stair training  Wheelchair Mobility    Modified Rankin (Stroke Patients Only)       Balance Overall balance assessment: Mild deficits observed, not  formally tested;Needs assistance(balance deficits consistent with recent surgery) Sitting-balance support: Feet supported Sitting balance-Leahy Scale: Good     Standing balance support: No upper extremity supported Standing balance-Leahy Scale: Good Standing balance comment: pt able to maintain standing balance at sink with no UE support in order to wash hands, brush teeth and floss, pt tolerated ~8 min standing, reliant on BUE support for ambulation                             Pertinent Vitals/Pain Pain Assessment: 0-10 Pain Score: 8  Pain Location: R knee Pain Descriptors / Indicators: Aching;Sore;Operative site guarding;Discomfort;Grimacing Pain Intervention(s): Limited activity within patient's tolerance;Monitored during session;Repositioned;Premedicated before session;Patient requesting pain meds-RN notified;Ice applied    Home Living Family/patient expects to be discharged to:: Private residence Living Arrangements: Alone Available Help at Discharge: Family;Available 24 hours/day(son staying with pt for first week) Type of Home: House Home Access: Ramped entrance     Home Layout: Two level;Able to live on main level with bedroom/bathroom(second level pt rents out rooms but has no reservations until end of january so does not have to go upstairs) Home Equipment: Environmental consultant - 2 wheels;Bedside commode;Shower seat;Wheelchair - manual(CPM)      Prior Function Level of Independence: Independent               Hand Dominance        Extremity/Trunk Assessment   Upper Extremity Assessment Upper Extremity Assessment: Overall WFL for tasks assessed    Lower Extremity Assessment Lower Extremity Assessment: Generalized weakness;RLE deficits/detail RLE Deficits / Details: decreased strength, ROM, balance consistent with recent surgery       Communication   Communication: No difficulties  Cognition Arousal/Alertness: Awake/alert Behavior During Therapy: WFL for  tasks assessed/performed Overall Cognitive Status: Within Functional Limits for tasks assessed                                        General Comments      Exercises Other Exercises Other Exercises: pt educated on HEP including ankle pumps, SLR, hip ABD, LAQ, knee flexion with pt verbalizing understanding and demonstration provided from PT, pt reporting having done all exercises with her L TKR a few years ago and still has exercise booklet at home   Assessment/Plan    PT Assessment Patient needs continued PT services  PT Problem List Decreased strength;Decreased mobility;Decreased range of motion;Decreased activity tolerance;Decreased balance;Decreased knowledge of use of DME;Pain;Obesity       PT Treatment Interventions DME instruction;Therapeutic exercise;Gait training;Balance training;Stair training;Functional mobility training;Therapeutic activities;Patient/family education    PT Goals (Current goals can be found in the Care Plan section)  Acute Rehab PT Goals Patient Stated Goal: go home PT Goal Formulation: With patient Time For Goal Achievement: 07/25/19 Potential to Achieve Goals: Good    Frequency 7X/week   Barriers to discharge        Co-evaluation               AM-PAC PT "6 Clicks" Mobility  Outcome Measure Help needed turning from your back to your side while in  a flat bed without using bedrails?: A Little Help needed moving from lying on your back to sitting on the side of a flat bed without using bedrails?: A Little Help needed moving to and from a bed to a chair (including a wheelchair)?: A Little Help needed standing up from a chair using your arms (e.g., wheelchair or bedside chair)?: A Little Help needed to walk in hospital room?: A Little Help needed climbing 3-5 steps with a railing? : A Little 6 Click Score: 18    End of Session Equipment Utilized During Treatment: Gait belt Activity Tolerance: Patient tolerated treatment  well Patient left: in chair;with call bell/phone within reach;with SCD's reapplied Nurse Communication: Mobility status;Patient requests pain meds PT Visit Diagnosis: Difficulty in walking, not elsewhere classified (R26.2);Other abnormalities of gait and mobility (R26.89);Pain Pain - Right/Left: Right Pain - part of body: Knee    Time: [id number] PT Time Calculation (min) (ACUTE ONLY): 42 min   Charges:   PT Evaluation $PT Eval Moderate Complexity: 1 Mod PT Treatments $Therapeutic Exercise: 8-22 mins $Therapeutic Activity: 8-22 mins        Zachary George PT, DPT 10:37 AM,07/11/19    Drucilla Chalet 07/11/2019, 10:37 AM

## 2019-07-11 NOTE — Telephone Encounter (Signed)
That's not my patient.  I think Dr. Marlou Sa

## 2019-07-11 NOTE — Telephone Encounter (Signed)
Please advise thanks.

## 2019-07-11 NOTE — Progress Notes (Signed)
Physical Therapy Treatment Patient Details Name: Mary Morrison MRN: [id number] DOB: 1965-12-07 Today's Date: 07/11/2019    History of Present Illness 53 y/o female s/p R TKA on 07/10/19, pmh includes L TKA in 2018, arthritis, depression, obesity    PT Comments    Pt received in bed with CPM speaking with PA about d/c home. PA stating pt can dc home with home health this afternoon. Pt is progressing well towards PT goals. Pt reviewed HEP and demo correct performance of exercises. Pt required min guard assist with less VC this session for all functional mobility. Pt encouraged to perform HEP starting tomorrow since she has been up to bathroom with nursing and two PT sessions today and will be getting home this pm with pt verbalizing understanding. Pt reporting no concerns over returning home and feels safe with her sons assistance.  Pt demo overall improved mobility this afternoon compared to this morning.   Follow Up Recommendations  Home health PT;Follow surgeon's recommendation for DC plan and follow-up therapies     Equipment Recommendations  None recommended by PT    Recommendations for Other Services       Precautions / Restrictions Precautions Precautions: Fall Restrictions Weight Bearing Restrictions: Yes RLE Weight Bearing: Weight bearing as tolerated    Mobility  Bed Mobility Overal bed mobility: Needs Assistance Bed Mobility: Supine to Sit     Supine to sit: Min guard;HOB elevated     General bed mobility comments: improved ability to get EOB compared to this morning, pt utilizing LLE to assist RLE to EOB, no physical assist or cuing required, pt faster and more efficient with getting EOB this pm  Transfers Overall transfer level: Needs assistance Equipment used: Rolling walker (2 wheeled) Transfers: Sit to/from Stand Sit to Stand: Min guard         General transfer comment: min guard for safety, no cuing required for hand placement and RW use, x1 from  low bed  Ambulation/Gait Ambulation/Gait assistance: Min guard Gait Distance (Feet): 75 Feet Assistive device: Rolling walker (2 wheeled) Gait Pattern/deviations: Step-to pattern;Decreased stride length;Antalgic;Trunk flexed;Decreased weight shift to right Gait velocity: decreased   General Gait Details: pt ambulated with increased reliance on UE support from RW, mildly antalgic with appearing to accept slightly more weight onto RLE this pm, no LOB noted   Stairs Stairs: Yes Stairs assistance: Min guard Stair Management: One rail Right;Forwards;With walker Number of Stairs: 2 General stair comments: deferred this pm since performed safely this morning and no stairs at home   Wheelchair Mobility    Modified Rankin (Stroke Patients Only)       Balance Overall balance assessment: Mild deficits observed, not formally tested;Needs assistance(balance deficits consistent with recent surgery) Sitting-balance support: Feet supported Sitting balance-Leahy Scale: Good     Standing balance support: No upper extremity supported Standing balance-Leahy Scale: Good Standing balance comment: reliant on BUE support for ambulation                            Cognition Arousal/Alertness: Awake/alert Behavior During Therapy: WFL for tasks assessed/performed Overall Cognitive Status: Within Functional Limits for tasks assessed                                        Exercises Total Joint Exercises Ankle Circles/Pumps: AROM;Both;10 reps Quad Sets: AROM;Right;5 reps Gluteal Sets: AROM;Both;5  reps Heel Slides: AROM;Right;5 reps Hip ABduction/ADduction: AROM;5 reps;Right Straight Leg Raises: AAROM;Right;5 reps Long Arc Quad: AROM;Right;5 reps Knee Flexion: AROM;Right;5 reps Other Exercises Other Exercises:     General Comments        Pertinent Vitals/Pain Pain Assessment: 0-10 Pain Score: 7  Pain Location: R knee Pain Descriptors / Indicators:  Aching;Sore;Operative site guarding;Discomfort;Grimacing Pain Intervention(s): Limited activity within patient's tolerance;Monitored during session;Repositioned    Home Living Family/patient expects to be discharged to:: Private residence Living Arrangements: Alone Available Help at Discharge: Family;Available 24 hours/day(son staying with pt for first week) Type of Home: House Home Access: Ramped entrance   Home Layout: Two level;Able to live on main level with bedroom/bathroom(second level pt rents out rooms but has no reservations until end of january so does not have to go upstairs) Home Equipment: Environmental consultant - 2 wheels;Bedside commode;Shower seat;Wheelchair - manual(CPM)      Prior Function Level of Independence: Independent          PT Goals (current goals can now be found in the care plan section) Acute Rehab PT Goals Patient Stated Goal: go home PT Goal Formulation: With patient Time For Goal Achievement: 07/25/19 Potential to Achieve Goals: Good Progress towards PT goals: Progressing toward goals    Frequency    7X/week      PT Plan Current plan remains appropriate    Co-evaluation              AM-PAC PT "6 Clicks" Mobility   Outcome Measure  Help needed turning from your back to your side while in a flat bed without using bedrails?: A Little Help needed moving from lying on your back to sitting on the side of a flat bed without using bedrails?: A Little Help needed moving to and from a bed to a chair (including a wheelchair)?: A Little Help needed standing up from a chair using your arms (e.g., wheelchair or bedside chair)?: A Little Help needed to walk in hospital room?: A Little Help needed climbing 3-5 steps with a railing? : A Little 6 Click Score: 18    End of Session Equipment Utilized During Treatment: Gait belt Activity Tolerance: Patient tolerated treatment well Patient left: in chair;with call bell/phone within reach Nurse Communication:  Mobility status PT Visit Diagnosis: Difficulty in walking, not elsewhere classified (R26.2);Other abnormalities of gait and mobility (R26.89);Pain Pain - Right/Left: Right Pain - part of body: Knee     Time: [id number] PT Time Calculation (min) (ACUTE ONLY): 19 min  Charges:  $Therapeutic Exercise: 8-22 mins                     Zachary George PT, DPT 1:41 PM,07/11/19     Drucilla Chalet 07/11/2019, 1:38 PM

## 2019-07-11 NOTE — Telephone Encounter (Signed)
Please advise. Thanks.  

## 2019-07-11 NOTE — Telephone Encounter (Signed)
Called and s/w patient's son.  Cancelled old RX for morphine tablets to pharmacy that does not carry them.  Sent new RX for morphine tablets to pharmacy that does carry them.  Additionally informed patient and son that they should not take hydrocodone and morphine together so hydrocodone was not refilled.  They have received the other Rxs of aspirin, robaxin, and ibuprofen.

## 2019-07-11 NOTE — Progress Notes (Signed)
  Subjective: Mary Morrison is a 53 y.o. female s/p right TKA.  They are POD1.  Pt's pain is controlled.  Pt denies numbness/tingling/weakness.  Pt has ambulated with little difficulty.     Objective: Vital signs in last 24 hours: Temp:  [97.7 F (36.5 C)-98.3 F (36.8 C)] 97.9 F (36.6 C) (12/18 0820) Pulse Rate:  [70-77] 70 (12/18 0820) Resp:  [9-19] 18 (12/18 0820) BP: (119-134)/(62-96) 134/82 (12/18 0820) SpO2:  [94 %-99 %] 99 % (12/18 0820)  Intake/Output from previous day: 12/17 0701 - 12/18 0700 In: 1550 [I.V.:1400; IV Piggyback:150] Out: 2675 [Urine:2600; Blood:75] Intake/Output this shift: Total I/O In: 240 [P.O.:240] Out: -   Exam:  No gross blood or drainage overlying the dressing 2+ DP pulse Sensation intact distally in the right foot Able to dorsiflex and plantarflex the right foot   Labs: No results for input(s): HGB in the last 72 hours. No results for input(s): WBC, RBC, HCT, PLT in the last 72 hours. No results for input(s): NA, K, CL, CO2, BUN, CREATININE, GLUCOSE, CALCIUM in the last 72 hours. No results for input(s): LABPT, INR in the last 72 hours.  Assessment/Plan: Pt is POD1 s/p right TKA.    -Plan to discharge to home today  -WBAT with a walker      L  07/11/2019, 1:13 PM

## 2019-07-11 NOTE — Telephone Encounter (Signed)
Dean patient 

## 2019-07-11 NOTE — Anesthesia Postprocedure Evaluation (Signed)
Anesthesia Post Note  Patient: Mary Morrison  Procedure(s) Performed: RIGHT TOTAL KNEE ARTHROPLASTY (Right Knee)     Patient location during evaluation: PACU Anesthesia Type: Spinal Level of consciousness: awake and alert Pain management: pain level controlled Vital Signs Assessment: post-procedure vital signs reviewed and stable Respiratory status: spontaneous breathing and respiratory function stable Cardiovascular status: blood pressure returned to baseline and stable Postop Assessment: spinal receding Anesthetic complications: no    Last Vitals:  Vitals:   07/11/19 0302 07/11/19 0820  BP: 122/76 134/82  Pulse: 73 70  Resp: 17 18  Temp: 36.8 C 36.6 C  SpO2: 98% 99%    Last Pain:  Vitals:   07/11/19 0820  TempSrc: Oral  PainSc:                  Tiajuana Amass

## 2019-07-11 NOTE — Telephone Encounter (Signed)
Please advise 

## 2019-07-13 NOTE — Discharge Summary (Signed)
Physician Discharge Summary      Patient ID: Mary Morrison MRN: [id number] DOB/AGE: Apr 24, 1966 53 y.o.  Admit date: 07/10/2019 Discharge date: 07/11/2019  Admission Diagnoses:  Active Problems:   Arthritis of right knee   Discharge Diagnoses:  Same  Surgeries: Procedure(s): RIGHT TOTAL KNEE ARTHROPLASTY on 07/10/2019   Consultants:   Discharged Condition: Stable  Hospital Course: Mary Morrison is an 53 y.o. female who was admitted 07/10/2019 with a chief complaint of right knee pain, and found to have a diagnosis of right knee arthritis.  They were brought to the operating room on 07/10/2019 and underwent the above named procedures.  Pt awoke from anesthesia without complication and was transferred to the floor. On POD1, patient's pain was well-controlled and she was able to mobilize very well.  Her case was discussed with the physical therapist who had no reservations about sending her home.  She was discharged on POD1.  She will have assistance at home by her son for the first week.  Pt will f/u with Dr. Marlou Sa in clinic in ~2 weeks.   Antibiotics given:  Anti-infectives (From admission, onward)   Start     Dose/Rate Route Frequency Ordered Stop   07/10/19 1830  ceFAZolin (ANCEF) IVPB 2g/100 mL premix     2 g 200 mL/hr over 30 Minutes Intravenous Every 6 hours 07/10/19 1754 07/11/19 0041   07/10/19 1130  ceFAZolin (ANCEF) 3 g in dextrose 5 % 50 mL IVPB     3 g 100 mL/hr over 30 Minutes Intravenous To ShortStay Surgical 07/09/19 1248 07/10/19 1258    .  Recent vital signs:  Vitals:   07/11/19 0302 07/11/19 0820  BP: 122/76 134/82  Pulse: 73 70  Resp: 17 18  Temp: 98.3 F (36.8 C) 97.9 F (36.6 C)  SpO2: 98% 99%    Recent laboratory studies:  Results for orders placed or performed during the hospital encounter of 07/07/19  Novel Coronavirus, NAA (Hosp order, Send-out to Ref Lab; TAT 18-24 hrs   Specimen: Nasopharyngeal Swab; Respiratory  Result Value  Ref Range   SARS-CoV-2, NAA NOT DETECTED NOT DETECTED   Coronavirus Source NASOPHARYNGEAL     Discharge Medications:   Allergies as of 07/11/2019   No Known Allergies     Medication List    STOP taking these medications   Aspirin Low Dose 81 MG EC tablet Generic drug: aspirin Replaced by: aspirin 81 MG chewable tablet   morphine 15 MG tablet Commonly known as: MSIR   traMADol 50 MG tablet Commonly known as: Ultram     TAKE these medications   albuterol 108 (90 Base) MCG/ACT inhaler Commonly known as: VENTOLIN HFA Inhale 1-2 puffs into the lungs every 6 (six) hours as needed for wheezing or shortness of breath. Notes to patient: Resume home regimen   amoxicillin 500 MG tablet Commonly known as: AMOXIL Take 2 grams 1 hour prior to dental procedure. What changed:   how much to take  how to take this  when to take this  additional instructions Notes to patient: Resume home regimen   aspirin 81 MG chewable tablet Chew 1 tablet (81 mg total) by mouth 2 (two) times daily. Replaces: Aspirin Low Dose 81 MG EC tablet   BIOFREEZE EX Apply 1 application topically daily as needed (pain). Notes to patient: Resume home regimen   buPROPion 300 MG 24 hr tablet Commonly known as: WELLBUTRIN XL Take 300 mg by mouth daily.   diclofenac Sodium 1 %  Gel Commonly known as: VOLTAREN Apply 2 g topically daily as needed (pain). Notes to patient: Resume home regimen   docusate sodium 100 MG capsule Commonly known as: COLACE Take 1 capsule (100 mg total) by mouth 2 (two) times daily.   EMERGEN-C VITAMIN C PO Take 1 tablet by mouth daily. Notes to patient: Resume home regimen   escitalopram 10 MG tablet Commonly known as: LEXAPRO Take 10 mg by mouth at bedtime.   ibuprofen 800 MG tablet Commonly known as: ADVIL Take 1 tablet (800 mg total) by mouth every 8 (eight) hours as needed. What changed: when to take this   methocarbamol 500 MG tablet Commonly known as:  ROBAXIN Take 1 tablet (500 mg total) by mouth every 8 (eight) hours as needed for muscle spasms. What changed: when to take this   PROBIOTIC PO Take 1 capsule by mouth daily. Notes to patient: Resume home regimen   traZODone 50 MG tablet Commonly known as: DESYREL Take 100 mg by mouth at bedtime.   triamterene-hydrochlorothiazide 37.5-25 MG tablet Commonly known as: MAXZIDE-25 Take 1 tablet by mouth daily.   VIACTIV PO Take 1 tablet by mouth daily. Notes to patient: Resume home regimen   zinc gluconate 50 MG tablet Take 50 mg by mouth daily. Notes to patient: Resume home regimen       Diagnostic Studies: No results found.  Disposition: Discharge disposition: 01-Home or Self Care       Discharge Instructions    Call MD / Call 911   Complete by: As directed    If you experience chest pain or shortness of breath, CALL 911 and be transported to the hospital emergency room.  If you develope a fever above 101 F, pus (white drainage) or increased drainage or redness at the wound, or calf pain, call your surgeon's office.   Constipation Prevention   Complete by: As directed    Drink plenty of fluids.  Prune juice may be helpful.  You may use a stool softener, such as Colace (over the counter) 100 mg twice a day.  Use MiraLax (over the counter) for constipation as needed.   Diet - low sodium heart healthy   Complete by: As directed    Discharge instructions   Complete by: As directed    You may shower, dressing is waterproof.  Do not remove the dressing, we will remove it at your first post-op appointment.  Do not take a bath or soak the knee in a tub or pool.  You may weightbear as you can tolerate on the operative leg with a walker.  Continue using the CPM machine 3 times per day for at least one hour each time, increasing the degrees of range of motion daily.  Use the blue cradle boot under your heel to work on getting your leg straight. Work on getting your leg straight,  this is the most important part of early rehab. Do NOT put a pillow under your knee.  You will follow-up with Dr. Marlou Sa in the clinic in 1-2 weeks at your given appointment date.   Increase activity slowly as tolerated   Complete by: As directed       Follow-up Information    [facility] up.   Specialty: Home Health Services Why: Home Health Physical Therapy Contact information: [address] [place] [address] [phone]            Signed: Donella Stade 07/13/2019, 2:55 PM

## 2019-07-14 ENCOUNTER — Telehealth: Payer: Self-pay | Admitting: Orthopedic Surgery

## 2019-07-14 NOTE — Telephone Encounter (Signed)
Gracie from [facility] calling requesting Home Health physical therapy for patient 3x a week for 2 weeks. Gracie from [facility] phone number is [phone].

## 2019-07-14 NOTE — Telephone Encounter (Signed)
IC verbal given.  

## 2019-07-16 ENCOUNTER — Telehealth: Payer: Self-pay | Admitting: Orthopaedic Surgery

## 2019-07-16 NOTE — Telephone Encounter (Signed)
FYI I told her this was fine to do

## 2019-07-16 NOTE — Telephone Encounter (Signed)
Patient called advised she would like to take the morphine every 8 hours instead of every 4 hours and if needed ibuprofen if that's ok.   Patient said she would like to get off of the morphine as soon as possible. The number to contact patient is [phone]

## 2019-07-24 ENCOUNTER — Other Ambulatory Visit: Payer: Self-pay

## 2019-07-24 ENCOUNTER — Encounter: Payer: Self-pay | Admitting: Surgery

## 2019-07-24 ENCOUNTER — Ambulatory Visit (INDEPENDENT_AMBULATORY_CARE_PROVIDER_SITE_OTHER): Payer: 59 | Admitting: Surgery

## 2019-07-24 ENCOUNTER — Ambulatory Visit: Payer: Self-pay

## 2019-07-24 VITALS — Ht 66.0 in | Wt 265.0 lb

## 2019-07-24 DIAGNOSIS — Z96651 Presence of right artificial knee joint: Secondary | ICD-10-CM

## 2019-07-24 MED ORDER — TRAMADOL HCL 50 MG PO TABS: 50.0000 mg | ORAL_TABLET | Freq: Four times a day (QID) | ORAL | 0 refills | Status: DC | PRN

## 2019-07-24 MED ORDER — IBUPROFEN 800 MG PO TABS: 800.0000 mg | ORAL_TABLET | Freq: Three times a day (TID) | ORAL | 0 refills | Status: DC | PRN

## 2019-07-24 NOTE — Progress Notes (Signed)
53 year old female who is 2 weeks status post right total knee replacement comes in for postop check.  Surgery performed by Dr. Marlou Sa and I am seeing patient in his absence.  States that her knee is doing well.  She has been doing home health PT.  Patient lives alone and is concerned about not having a driver to start outpatient therapy.  She needs refill of pain medications but would like to use Ultram instead of the morphine tablets.  Exam Pleasant female alert and oriented in no acute distress.  Surgical incision looks good.  New Steri-Strips applied.  No drainage or signs of infection.  Calf nontender.  Knee range of motion about 0 to 95 degrees.  Plan We will see if home health can continue PT x2-3 more weeks.  Patient had mentioned trying to do therapy on her own but I advised her that Dr. Marlou Sa will want her to do outpatient therapy when she is discharged from home health.  She has some concerns about cost of therapy.  She can discuss that with Dr. Marlou Sa when he returns.  Follow-up in 4 weeks for recheck.

## 2019-08-05 ENCOUNTER — Telehealth: Payer: Self-pay | Admitting: Orthopedic Surgery

## 2019-08-05 NOTE — Telephone Encounter (Signed)
Patient called.   She is requesting that she be released from her driving restrictions. She also says she is off of prescription pain meds  Call back number: [phone]

## 2019-08-05 NOTE — Telephone Encounter (Signed)
Please advise. Thanks.  

## 2019-08-06 ENCOUNTER — Other Ambulatory Visit: Payer: Self-pay

## 2019-08-06 DIAGNOSIS — Z96651 Presence of right artificial knee joint: Secondary | ICD-10-CM

## 2019-08-06 NOTE — Telephone Encounter (Signed)
I called pt to advise of message below will call with any additional questions.

## 2019-08-07 ENCOUNTER — Ambulatory Visit: Payer: Self-pay | Admitting: Physical Therapy

## 2019-08-08 ENCOUNTER — Ambulatory Visit (INDEPENDENT_AMBULATORY_CARE_PROVIDER_SITE_OTHER): Payer: 59 | Admitting: Physical Therapy

## 2019-08-08 ENCOUNTER — Other Ambulatory Visit: Payer: Self-pay

## 2019-08-08 ENCOUNTER — Encounter: Payer: Self-pay | Admitting: Physical Therapy

## 2019-08-08 DIAGNOSIS — M25661 Stiffness of right knee, not elsewhere classified: Secondary | ICD-10-CM

## 2019-08-08 DIAGNOSIS — M6281 Muscle weakness (generalized): Secondary | ICD-10-CM | POA: Diagnosis not present

## 2019-08-08 DIAGNOSIS — M25561 Pain in right knee: Secondary | ICD-10-CM

## 2019-08-08 DIAGNOSIS — R600 Localized edema: Secondary | ICD-10-CM

## 2019-08-08 DIAGNOSIS — R262 Difficulty in walking, not elsewhere classified: Secondary | ICD-10-CM

## 2019-08-08 NOTE — Patient Instructions (Signed)
Access Code: [email or web address]  URL: [email or web address]  Date: 08/08/2019  Prepared by: Faustino Congress   Exercises Supine Quad Set - 10 reps - 1 sets - 5 sec hold - 3x daily - 7x weekly Supine Heel Slide with Strap - 10 reps - 1 sets - 5 sec hold - 3x daily - 7x weekly Supine Bridge - 10 reps - 1 sets - 5 sec hold - 3x daily - 7x weekly Mini Squat with Counter Support - 10 reps - 1 sets - 3x daily - 7x weekly Standing Single Leg Stance with Unilateral Counter Support - 3 reps - 1 sets - 10 sec hold - 3x daily - 7x weekly Forward Step Up - 10 reps - 1 sets - 3x daily - 7x weekly

## 2019-08-08 NOTE — Therapy (Signed)
[facility] [address] [place], [place], [address] Phone: [phone]   Fax:  [phone]  Physical Therapy Evaluation  Patient Details  Name: Mary Morrison MRN: [id number] Date of Birth: 1966/03/18 Referring Provider (PT): Marlou Sa Tonna Corner, MD   Encounter Date: 08/08/2019  PT End of Session - 08/08/19 0909    Visit Number  1    Number of Visits  12    Date for PT Re-Evaluation  09/19/19    PT Start Time  0811    PT Stop Time  0843    PT Time Calculation (min)  32 min    Activity Tolerance  Patient tolerated treatment well    Behavior During Therapy  [facility] for tasks assessed/performed       Past Medical History:  Diagnosis Date  . Arthritis   . Asthma    EXERCISED INDUCED 2014   . Depression   . Fibroids   . Obesity   . Seasonal allergies   . Varicose vein of leg     Past Surgical History:  Procedure Laterality Date  . BILATERAL SALPINGECTOMY Bilateral 09/21/2014   Procedure: BILATERAL SALPINGECTOMY;  Surgeon: Marvene Staff, MD;  Location: [facility];  Service: Gynecology;  Laterality: Bilateral;  . CESAREAN SECTION  1989  . COLONOSCOPY    . LAPAROSCOPIC GASTRIC BANDING N/A 02/17/2014   Procedure: LAPAROSCOPIC GASTRIC BANDING;  Surgeon: Pedro Earls, MD;  Location: [facility];  Service: General;  Laterality: N/A;  . ROBOT ASSISTED MYOMECTOMY N/A 09/21/2014   Procedure: ROBOTIC ASSISTED MYOMECTOMY;  Surgeon: Marvene Staff, MD;  Location: [facility];  Service: Gynecology;  Laterality: N/A;  . ROBOTIC ASSISTED TOTAL HYSTERECTOMY N/A 09/21/2014   Procedure: ROBOTIC ASSISTED TOTAL HYSTERECTOMY, EXCISION OF PELVIC ENDOMETRIOSIS;  Surgeon: Marvene Staff, MD;  Location: [facility];  Service: Gynecology;  Laterality: N/A;  . TONSILLECTOMY    . TOTAL KNEE ARTHROPLASTY Left 07/03/2017   Procedure: LEFT TOTAL KNEE ARTHROPLASTY;  Surgeon: Meredith Pel, MD;  Location: [place];  Service: Orthopedics;  Laterality: Left;  .  TOTAL KNEE ARTHROPLASTY Right 07/10/2019   Procedure: RIGHT TOTAL KNEE ARTHROPLASTY;  Surgeon: Meredith Pel, MD;  Location: [place];  Service: Orthopedics;  Laterality: Right;    There were no vitals filed for this visit.   Subjective Assessment - 08/08/19 0815    Subjective  Patient is a 54 yo female presenting to [place] s/p right total knee arthroscopy on 07/10/2019.  Pt c/o continued pain, swelling and ROM and strength limitations.    Pertinent History  arthritis, exercise induced asthma, depression, obesity, L TKA 07/03/2017    Patient Stated Goals  full ROM; walk dogs (3 mile round trip), go up/down steps; walk without limp    Currently in Pain?  Yes    Pain Score  5    up to 10/10; at best 3/10   Pain Location  Knee    Pain Orientation  Right    Pain Descriptors / Indicators  Aching;Dull    Pain Type  Acute pain;Surgical pain    Pain Radiating Towards  hip to ankle    Pain Onset  1 to 4 weeks ago    Pain Frequency  Constant    Aggravating Factors   increased activity with standing/walking/steps    Pain Relieving Factors  ibuprofen, rest, heat/ice         [facility] PT Assessment - 08/08/19 0819      Assessment   Medical Diagnosis  Rt  TKA    Referring Provider (PT)  Marlou Sa Tonna Corner, MD    Onset Date/Surgical Date  07/10/19    Hand Dominance  Right    Next MD Visit  08/21/19    Prior Therapy  [facility]      Precautions   Precautions  None      Restrictions   Weight Bearing Restrictions  No      Balance Screen   Has the patient fallen in the past 6 months  Yes    How many times?  3    Has the patient had a decrease in activity level because of a fear of falling?   No    Is the patient reluctant to leave their home because of a fear of falling?   No      Home Environment   Living Environment  Private residence    Living Arrangements  Alone    Type of [place] Access  Stairs to enter    Entrance Stairs-Number of Steps  2    Entrance Stairs-Rails  None     Home Layout  Multi-level    Alternate Level Stairs-Number of Steps  14    Alternate Level Stairs-Rails  Right      Prior Function   Level of Independence  Independent    Vocation  Unemployed    Vocation Requirements  has B&B through [employer]  host parties, game nights, walk dogs, otherwise nothing right now      Cognition   Overall Cognitive Status  Within Functional Limits for tasks assessed      Observation/Other Assessments   Observations  RLE swelling present      Posture/Postural Control   Posture/Postural Control  Postural limitations    Postural Limitations  Rounded Shoulders      ROM / Strength   AROM / PROM / Strength  AROM;PROM;Strength      AROM   AROM Assessment Site  Knee    Right/Left Knee  Right;Left    Right Knee Extension  0    Right Knee Flexion  107    Left Knee Extension  0    Left Knee Flexion  110      PROM   PROM Assessment Site  Knee    Right/Left Knee  Right    Right Knee Flexion  112      Strength   Overall Strength Comments  RLE grossly 3+/5      Special Tests    Special Tests  Leg LengthTest    Leg length test   True      True   Length  inches    Right  35 in.    Left   33.5 in.      Ambulation/Gait   Gait Pattern  Decreased stance time - right;Decreased step length - left;Decreased hip/knee flexion - right;Trendelenburg                Objective measurements completed on examination: See above findings.      [facility] Adult PT Treatment/Exercise - 08/08/19 0819      Exercises   Exercises  Knee/Hip      Knee/Hip Exercises: Standing   Forward Step Up Limitations  instructed for HEP    Functional Squat  5 reps    SLS  instructed for HEP      Knee/Hip Exercises: Supine   Quad Sets  Right;5 reps    Heel  Slides  AAROM;Right;5 reps    Bridges Limitations  instructed for home             PT Education - 08/08/19 0909    Education Details  HEP    Person(s) Educated  Patient    Methods   Explanation;Demonstration;Handout    Comprehension  Verbalized understanding;Returned demonstration;Need further instruction          PT Long Term Goals - 08/08/19 1212      PT LONG TERM GOAL #1   Title  independent with HEP    Status  New    Target Date  09/19/19      PT LONG TERM GOAL #2   Title  Pt will be able to walk without noticeable limp in community, most of the time.     Status  New    Target Date  09/19/19      PT LONG TERM GOAL #3   Title  Rt knee AROM 0-110 for improved function and mobility    Status  New    Target Date  09/19/19      PT LONG TERM GOAL #4   Title  Pt will be able to walk up and down stairs in her home with confidence and no lasting knee pain.     Status  New    Target Date  09/19/19      PT LONG TERM GOAL #5   Title  report pain < 2/10 with mobility for improved function    Status  New    Target Date  09/19/19             Plan - 08/08/19 0912    Clinical Impression Statement  Pt is a 54 y/o female who presents to [facility] s/p Rt TKA on 07/10/19.  Pt demonstrates mild ROM limitations, with decreased strength, continued pain and gait abnormalities affecting functional mobility.  Pt will benefit from PT to address deficits listed.    Personal Factors and Comorbidities  Comorbidity 3+    Comorbidities  arthritis, exercise induced asthma, depression, obesity, L TKA 07/03/2017    Examination-Activity Limitations  Locomotion Level;Transfers;Stairs;Squat    Examination-Participation Restrictions  Community Activity    Stability/Clinical Decision Making  Evolving/Moderate complexity    Clinical Decision Making  Moderate    Rehab Potential  Good    PT Frequency  2x / week    PT Duration  6 weeks   anticipate d/c in 4-6 wks   PT Treatment/Interventions  ADLs/Self Care Home Management;Cryotherapy;Electrical Stimulation;Moist Heat;Gait training;Stair training;Functional mobility training;Therapeutic activities;Therapeutic exercise;Balance  training;Patient/family education;Neuromuscular re-education;Manual techniques;Vasopneumatic Device;Taping;Dry needling;Passive range of motion    PT Next Visit Plan  review HEP, progress strengthening/standing exercises, manual/modalities PRN    PT Home Exercise Plan  Access Code: [email or web address]    Consulted and Agree with Plan of Care  Patient       Patient will benefit from skilled therapeutic intervention in order to improve the following deficits and impairments:  Abnormal gait, Pain, Decreased mobility, Decreased range of motion, Decreased strength, Impaired flexibility, Difficulty walking, Decreased balance  Visit Diagnosis: Acute pain of right knee - Plan: PT plan of care cert/re-cert  Localized edema - Plan: PT plan of care cert/re-cert  Muscle weakness (generalized) - Plan: PT plan of care cert/re-cert  Stiffness of right knee, not elsewhere classified - Plan: PT plan of care cert/re-cert  Difficulty in walking, not elsewhere classified - Plan: PT plan of care cert/re-cert     Problem List Patient  Active Problem List   Diagnosis Date Noted  . Arthritis of right knee 07/10/2019  . Arthritis of knee 07/03/2017  . Varicose veins of bilateral lower extremities with other complications [id number]  . Acute pain of left knee 01/25/2017  . Swelling of left knee joint 01/25/2017  . S/P hysterectomy 09/21/2014  . Lapband APS July 2015 02/17/2014  . Edema-chronic pitting in legs 01/20/2014  . Morbid obesity ([place]) 08/28/2013      Laureen Abrahams, PT, DPT 08/08/19 12:16 PM     [facility] [address] [place], [place], [address] Phone: [phone]   Fax:  [phone]  Name: Mary Morrison MRN: [id number] Date of Birth: 1966-06-28

## 2019-08-11 ENCOUNTER — Other Ambulatory Visit: Payer: Self-pay

## 2019-08-11 ENCOUNTER — Encounter: Payer: Self-pay | Admitting: Physical Therapy

## 2019-08-11 ENCOUNTER — Ambulatory Visit (INDEPENDENT_AMBULATORY_CARE_PROVIDER_SITE_OTHER): Payer: 59 | Admitting: Physical Therapy

## 2019-08-11 DIAGNOSIS — M25661 Stiffness of right knee, not elsewhere classified: Secondary | ICD-10-CM | POA: Diagnosis not present

## 2019-08-11 DIAGNOSIS — R262 Difficulty in walking, not elsewhere classified: Secondary | ICD-10-CM

## 2019-08-11 DIAGNOSIS — M25561 Pain in right knee: Secondary | ICD-10-CM | POA: Diagnosis not present

## 2019-08-11 DIAGNOSIS — R600 Localized edema: Secondary | ICD-10-CM

## 2019-08-11 DIAGNOSIS — M6281 Muscle weakness (generalized): Secondary | ICD-10-CM

## 2019-08-11 NOTE — Therapy (Signed)
[facility] [address] [place], [place], [address] Phone: [phone]   Fax:  [phone]  Physical Therapy Treatment  Patient Details  Name: Mary Morrison MRN: [id number] Date of Birth: 01-11-1966 Referring Provider (PT): Marlou Sa Tonna Corner, MD   Encounter Date: 08/11/2019  PT End of Session - 08/11/19 [id number]    Visit Number  2    Number of Visits  12    Date for PT Re-Evaluation  09/19/19    PT Start Time  1147    PT Stop Time  1230    PT Time Calculation (min)  43 min    Activity Tolerance  Patient tolerated treatment well    Behavior During Therapy  [facility] for tasks assessed/performed       Past Medical History:  Diagnosis Date  . Arthritis   . Asthma    EXERCISED INDUCED 2014   . Depression   . Fibroids   . Obesity   . Seasonal allergies   . Varicose vein of leg     Past Surgical History:  Procedure Laterality Date  . BILATERAL SALPINGECTOMY Bilateral 09/21/2014   Procedure: BILATERAL SALPINGECTOMY;  Surgeon: Marvene Staff, MD;  Location: [facility];  Service: Gynecology;  Laterality: Bilateral;  . CESAREAN SECTION  1989  . COLONOSCOPY    . LAPAROSCOPIC GASTRIC BANDING N/A 02/17/2014   Procedure: LAPAROSCOPIC GASTRIC BANDING;  Surgeon: Pedro Earls, MD;  Location: [facility];  Service: General;  Laterality: N/A;  . ROBOT ASSISTED MYOMECTOMY N/A 09/21/2014   Procedure: ROBOTIC ASSISTED MYOMECTOMY;  Surgeon: Marvene Staff, MD;  Location: [facility];  Service: Gynecology;  Laterality: N/A;  . ROBOTIC ASSISTED TOTAL HYSTERECTOMY N/A 09/21/2014   Procedure: ROBOTIC ASSISTED TOTAL HYSTERECTOMY, EXCISION OF PELVIC ENDOMETRIOSIS;  Surgeon: Marvene Staff, MD;  Location: [facility];  Service: Gynecology;  Laterality: N/A;  . TONSILLECTOMY    . TOTAL KNEE ARTHROPLASTY Left 07/03/2017   Procedure: LEFT TOTAL KNEE ARTHROPLASTY;  Surgeon: Meredith Pel, MD;  Location: [place];  Service: Orthopedics;  Laterality: Left;  .  TOTAL KNEE ARTHROPLASTY Right 07/10/2019   Procedure: RIGHT TOTAL KNEE ARTHROPLASTY;  Surgeon: Meredith Pel, MD;  Location: [place];  Service: Orthopedics;  Laterality: Right;    There were no vitals filed for this visit.  Subjective Assessment - 08/11/19 1225    Subjective  No real pain today but 2-3/10 Rt knee soreness anteriorly. Relays she has been walking more but feels off, like one leg is shorter    Pertinent History  arthritis, exercise induced asthma, depression, obesity, L TKA 07/03/2017    Patient Stated Goals  full ROM; walk dogs (3 mile round trip), go up/down steps; walk without limp    Currently in Pain?  No/denies    Pain Onset  1 to 4 weeks ago         [facility] PT Assessment - 08/11/19 0001      Assessment   Medical Diagnosis  Rt TKA    Referring Provider (PT)  Meredith Pel, MD    Onset Date/Surgical Date  07/10/19      AROM   Right Knee Extension  0    Right Knee Flexion  112      PROM   Right Knee Flexion  115      True   Right  35.5 in.    Left   35 in.    Comments  measuring from ASIS to lateral malleolus  [facility] Adult PT Treatment/Exercise - 08/11/19 0001      Ambulation/Gait   Gait Comments  increaesed trunk lean to Lt, due to apparant 1/2 inch true leg length descrepency. Provided her with 1/2 inch heel lift that can be adjusted down if needed and she was able to walk with less trunk lean and her hips appear more level in standing      Knee/Hip Exercises: Aerobic   Nustep  L5 X 5 min for knee ROM and endurance      Knee/Hip Exercises: Machines for Strengthening   Total Gym Leg Press  shuttle press SL 75 lbs 3x10 each side pushing unilat      Knee/Hip Exercises: Seated   Sit to Sand  15 reps;without UE support   Lt leg further in front for more wt shift onto Rt     Knee/Hip Exercises: Supine   Heel Slides  AAROM;Right;10 reps    Heel Slides Limitations  holding 10 sec    Bridges  15 reps    Straight Leg  Raise with External Rotation  Right;15 reps      Manual Therapy   Manual therapy comments  Rt knee PROM, STM             PT Education - 08/11/19 1240    Education Details  rationale for heel lift and to adjust ht PRN    Person(s) Educated  Patient    Methods  Explanation;Demonstration;Verbal cues    Comprehension  Verbalized understanding          PT Long Term Goals - 08/08/19 1212      PT LONG TERM GOAL #1   Title  independent with HEP    Status  New    Target Date  09/19/19      PT LONG TERM GOAL #2   Title  Pt will be able to walk without noticeable limp in community, most of the time.     Status  New    Target Date  09/19/19      PT LONG TERM GOAL #3   Title  Rt knee AROM 0-110 for improved function and mobility    Status  New    Target Date  09/19/19      PT LONG TERM GOAL #4   Title  Pt will be able to walk up and down stairs in her home with confidence and no lasting knee pain.     Status  New    Target Date  09/19/19      PT LONG TERM GOAL #5   Title  report pain < 2/10 with mobility for improved function    Status  New    Target Date  09/19/19            Plan - 08/11/19 1241    Clinical Impression Statement  She is making great overall progress with knee ROM, strength, activity tolerance and gait. She did still have trunk lean to Lt during gait and this appears to be due to Lt leg shorter descripency. Provided her with heelift which decreases overall turnk lean with gait. She had no knee complaints with increased activity progression today, PT will continue to progress as able toward her goals.    Personal Factors and Comorbidities  Comorbidity 3+    Comorbidities  arthritis, exercise induced asthma, depression, obesity, L TKA 07/03/2017    Examination-Activity Limitations  Locomotion Level;Transfers;Stairs;Squat    Examination-Participation Restrictions  Community Activity    Stability/Clinical Decision Making  Evolving/Moderate complexity     Rehab Potential  Good    PT Frequency  2x / week    PT Duration  6 weeks   anticipate d/c in 4-6 wks   PT Treatment/Interventions  ADLs/Self Care Home Management;Cryotherapy;Electrical Stimulation;Moist Heat;Gait training;Stair training;Functional mobility training;Therapeutic activities;Therapeutic exercise;Balance training;Patient/family education;Neuromuscular re-education;Manual techniques;Vasopneumatic Device;Taping;Dry needling;Passive range of motion    PT Next Visit Plan  how was heel lift? aprogress strengthening/standing exercises, manual/modalities PRN    PT Home Exercise Plan  Access Code: [email or web address]    Consulted and Agree with Plan of Care  Patient       Patient will benefit from skilled therapeutic intervention in order to improve the following deficits and impairments:  Abnormal gait, Pain, Decreased mobility, Decreased range of motion, Decreased strength, Impaired flexibility, Difficulty walking, Decreased balance  Visit Diagnosis: Acute pain of right knee  Localized edema  Muscle weakness (generalized)  Stiffness of right knee, not elsewhere classified  Difficulty in walking, not elsewhere classified     Problem List Patient Active Problem List   Diagnosis Date Noted  . Arthritis of right knee 07/10/2019  . Arthritis of knee 07/03/2017  . Varicose veins of bilateral lower extremities with other complications [id number]  . Acute pain of left knee 01/25/2017  . Swelling of left knee joint 01/25/2017  . S/P hysterectomy 09/21/2014  . Lapband APS July 2015 02/17/2014  . Edema-chronic pitting in legs 01/20/2014  . Morbid obesity ([place]) 08/28/2013    Silvestre Mesi 08/11/2019, 12:45 PM  [facility] [address] [place], [place], [address] Phone: [phone]   Fax:  [phone]  Name: Mary Morrison MRN: [id number] Date of Birth: August 30, 1965

## 2019-08-14 ENCOUNTER — Ambulatory Visit (INDEPENDENT_AMBULATORY_CARE_PROVIDER_SITE_OTHER): Payer: 59 | Admitting: Physical Therapy

## 2019-08-14 ENCOUNTER — Other Ambulatory Visit: Payer: Self-pay

## 2019-08-14 ENCOUNTER — Encounter: Payer: Self-pay | Admitting: Physical Therapy

## 2019-08-14 DIAGNOSIS — M6281 Muscle weakness (generalized): Secondary | ICD-10-CM | POA: Diagnosis not present

## 2019-08-14 DIAGNOSIS — R600 Localized edema: Secondary | ICD-10-CM

## 2019-08-14 DIAGNOSIS — M25561 Pain in right knee: Secondary | ICD-10-CM | POA: Diagnosis not present

## 2019-08-14 DIAGNOSIS — M25661 Stiffness of right knee, not elsewhere classified: Secondary | ICD-10-CM

## 2019-08-14 NOTE — Therapy (Addendum)
[facility] Physical Therapy [address] [place], [place], [address] Phone: [phone]   Fax:  [phone]  Physical Therapy Treatment/Discharge Summary  Patient Details  Name: Mary Morrison MRN: [id number] Date of Birth: Apr 05, 1966 Referring Provider (PT): Marlou Sa Tonna Corner, MD   Encounter Date: 08/14/2019  PT End of Session - 08/14/19 0927    Visit Number  3    Number of Visits  12    Date for PT Re-Evaluation  09/19/19    PT Start Time  0807    PT Stop Time  0855    PT Time Calculation (min)  48 min    Activity Tolerance  Patient tolerated treatment well    Behavior During Therapy  [facility] for tasks assessed/performed       Past Medical History:  Diagnosis Date  . Arthritis   . Asthma    EXERCISED INDUCED 2014   . Depression   . Fibroids   . Obesity   . Seasonal allergies   . Varicose vein of leg     Past Surgical History:  Procedure Laterality Date  . BILATERAL SALPINGECTOMY Bilateral 09/21/2014   Procedure: BILATERAL SALPINGECTOMY;  Surgeon: Marvene Staff, MD;  Location: [facility];  Service: Gynecology;  Laterality: Bilateral;  . CESAREAN SECTION  1989  . COLONOSCOPY    . LAPAROSCOPIC GASTRIC BANDING N/A 02/17/2014   Procedure: LAPAROSCOPIC GASTRIC BANDING;  Surgeon: Pedro Earls, MD;  Location: [facility];  Service: General;  Laterality: N/A;  . ROBOT ASSISTED MYOMECTOMY N/A 09/21/2014   Procedure: ROBOTIC ASSISTED MYOMECTOMY;  Surgeon: Marvene Staff, MD;  Location: [facility];  Service: Gynecology;  Laterality: N/A;  . ROBOTIC ASSISTED TOTAL HYSTERECTOMY N/A 09/21/2014   Procedure: ROBOTIC ASSISTED TOTAL HYSTERECTOMY, EXCISION OF PELVIC ENDOMETRIOSIS;  Surgeon: Marvene Staff, MD;  Location: [facility];  Service: Gynecology;  Laterality: N/A;  . TONSILLECTOMY    . TOTAL KNEE ARTHROPLASTY Left 07/03/2017   Procedure: LEFT TOTAL KNEE ARTHROPLASTY;  Surgeon: Meredith Pel, MD;  Location: [place];  Service: Orthopedics;   Laterality: Left;  . TOTAL KNEE ARTHROPLASTY Right 07/10/2019   Procedure: RIGHT TOTAL KNEE ARTHROPLASTY;  Surgeon: Meredith Pel, MD;  Location: [place];  Service: Orthopedics;  Laterality: Right;    There were no vitals filed for this visit.  Subjective Assessment - 08/14/19 0827    Subjective  Pt says her Rt knee is very sore as she tripped over a bag of gravel and fell back, she did not land on her knee, landed on her back and denies injury but maybe pulled her quad.    Pertinent History  arthritis, exercise induced asthma, depression, obesity, L TKA 07/03/2017    Patient Stated Goals  full ROM; walk dogs (3 mile round trip), go up/down steps; walk without limp    Currently in Pain?  Yes    Pain Score  6    more soreness than pain   Pain Orientation  Right    Pain Descriptors / Indicators  Sore    Pain Onset  1 to 4 weeks ago         [facility] PT Assessment - 08/14/19 0001      Assessment   Medical Diagnosis  Rt TKA    Referring Provider (PT)  Meredith Pel, MD    Onset Date/Surgical Date  07/10/19      AROM   Right Knee Extension  0    Right Knee Flexion  117   AAROM with  strap                  [facility] Adult PT Treatment/Exercise - 08/14/19 0001      Knee/Hip Exercises: Stretches   Sports administrator  Right;3 reps;30 seconds    Quad Stretch Limitations  supine with strap    Other Knee/Hip Stretches  heelslides AAROM 10 sec X 10      Knee/Hip Exercises: Aerobic   Nustep  L6 X 5 min for knee ROM and endurance      Knee/Hip Exercises: Machines for Strengthening   Total Gym Leg Press  shuttle press SL 75 lbs 2x10 each side pushing unilat      Knee/Hip Exercises: Standing   Lateral Step Up  Right;10 reps;Step Height: 6";Hand Hold: 0    Forward Step Up  Right;10 reps;Hand Hold: 0;Step Height: 6"    Step Down  Right;10 reps;Hand Hold: 2;Step Height: 6"    Other Standing Knee Exercises  SLS on Rt 10 sec X 5 reps      Modalities   Modalities  Vasopneumatic       Vasopneumatic   Number Minutes Vasopneumatic   10 minutes    Vasopnuematic Location   Knee    Vasopneumatic Pressure  Medium    Vasopneumatic Temperature   32 deg                   PT Long Term Goals - 08/08/19 1212      PT LONG TERM GOAL #1   Title  independent with HEP    Status  New    Target Date  09/19/19      PT LONG TERM GOAL #2   Title  Pt will be able to walk without noticeable limp in community, most of the time.     Status  New    Target Date  09/19/19      PT LONG TERM GOAL #3   Title  Rt knee AROM 0-110 for improved function and mobility    Status  New    Target Date  09/19/19      PT LONG TERM GOAL #4   Title  Pt will be able to walk up and down stairs in her home with confidence and no lasting knee pain.     Status  New    Target Date  09/19/19      PT LONG TERM GOAL #5   Title  report pain < 2/10 with mobility for improved function    Status  New    Target Date  09/19/19            Plan - 08/14/19 7014    Clinical Impression Statement  Despite having a fall yesterday(fell backwards with no significant injury) she is doing really well with her recovery from TKA. ROM is progressing well and she was albe to tolerate more closed chain strength today without complaints. She did have some swelling and soreness in her knee so applied vaso post tx to reduce this. PT will continue to progress as able    Personal Factors and Comorbidities  Comorbidity 3+    Comorbidities  arthritis, exercise induced asthma, depression, obesity, L TKA 07/03/2017    Examination-Activity Limitations  Locomotion Level;Transfers;Stairs;Squat    Examination-Participation Restrictions  Community Activity    Stability/Clinical Decision Making  Evolving/Moderate complexity    Rehab Potential  Good    PT Frequency  2x / week    PT Duration  6 weeks  anticipate d/c in 4-6 wks   PT Treatment/Interventions  ADLs/Self Care Home Management;Cryotherapy;Electrical  Stimulation;Moist Heat;Gait training;Stair training;Functional mobility training;Therapeutic activities;Therapeutic exercise;Balance training;Patient/family education;Neuromuscular re-education;Manual techniques;Vasopneumatic Device;Taping;Dry needling;Passive range of motion    PT Next Visit Plan  how was heel lift? aprogress strengthening/standing exercises, manual/modalities PRN    PT Home Exercise Plan  Access Code: [email or web address]    Consulted and Agree with Plan of Care  Patient       Patient will benefit from skilled therapeutic intervention in order to improve the following deficits and impairments:  Abnormal gait, Pain, Decreased mobility, Decreased range of motion, Decreased strength, Impaired flexibility, Difficulty walking, Decreased balance  Visit Diagnosis: Acute pain of right knee  Localized edema  Muscle weakness (generalized)  Stiffness of right knee, not elsewhere classified     Problem List Patient Active Problem List   Diagnosis Date Noted  . Arthritis of right knee 07/10/2019  . Arthritis of knee 07/03/2017  . Varicose veins of bilateral lower extremities with other complications 01/60/1093  . Acute pain of left knee 01/25/2017  . Swelling of left knee joint 01/25/2017  . S/P hysterectomy 09/21/2014  . Lapband APS July 2015 02/17/2014  . Edema-chronic pitting in legs 01/20/2014  . Morbid obesity ([place]) 08/28/2013    Debbe Odea, PT,DPT 08/14/2019, 9:30 AM  [facility] Physical Therapy [address] [place], [place], [address] Phone: [phone]   Fax:  [phone]  Name: MARYURI WARNKE MRN: [id number] Date of Birth: August 15, 1965     PHYSICAL THERAPY DISCHARGE SUMMARY  Visits from Start of Care: 3  Current functional level related to goals / functional outcomes: See above   Remaining deficits: See above   Education / Equipment: HEP  Plan: Patient agrees to discharge.  Patient goals were not met. Patient is being  discharged due to the patient's request.  ?????    Pt is pleased with current progress, and after MD visit requested d/c.  Laureen Abrahams, PT, DPT 08/22/19 10:44 AM  [facility] Physical Therapy [address] [place], [place], [address] Phone: [phone]   Fax:  [phone]

## 2019-08-21 ENCOUNTER — Other Ambulatory Visit: Payer: Self-pay

## 2019-08-21 ENCOUNTER — Ambulatory Visit (INDEPENDENT_AMBULATORY_CARE_PROVIDER_SITE_OTHER): Payer: 59 | Admitting: Orthopedic Surgery

## 2019-08-21 ENCOUNTER — Encounter: Payer: Self-pay | Admitting: Orthopedic Surgery

## 2019-08-21 VITALS — Wt 258.0 lb

## 2019-08-21 DIAGNOSIS — Z96651 Presence of right artificial knee joint: Secondary | ICD-10-CM

## 2019-08-21 NOTE — Progress Notes (Signed)
Post-Op Visit Note   Patient: Mary Morrison           Date of Birth: 1966/01/14           MRN: [id number] Visit Date: 08/21/2019 PCP: Harlan Stains, MD   Assessment & Plan:  Chief Complaint:  Chief Complaint  Patient presents with  . Right Knee - Routine Post Op   Visit Diagnoses:  1. S/P TKR (total knee replacement), right     Plan: Patient is a 54 year old female who presents s/p right total knee arthroplasty on 07/10/2019.  She states that she is doing well with some persistent soreness.  She is making gains in range of motion and strength in physical therapy.  She is going to physical therapy 2 times a week.  She is not taking any pain medication any longer aside from some ibuprofen 800 mg about twice a day as needed.  On exam she has 0 degrees of extension and 115 degrees of flexion.  She is doing excellent and states that she is "progressing much better than her last knee surgery".  She is not using any assistance devices such as a cane or walker.  She has no calf tenderness on exam with a negative Homans' sign.  She has discontinued aspirin but I recommended that she continue to take 1 baby aspirin a day over the next month.  Her incision is healing well with no evidence of dehiscence.  All in all patient is doing extremely well and I think that with her experience in rehabbing her knee as well as the progress she has already made she can discontinue physical therapy and transition to home exercise program.  Patient agrees with this plan.  We will see her back in about 6 weeks for final check.  X-rays reviewed in this clinic visit and show a well aligned total knee prosthesis with no complicating features.  Follow-Up Instructions: No follow-ups on file.   Orders:  No orders of the defined types were placed in this encounter.  No orders of the defined types were placed in this encounter.   Imaging: No results found.  PMFS History: Patient Active Problem List   Diagnosis Date Noted  . Arthritis of right knee 07/10/2019  . Arthritis of knee 07/03/2017  . Varicose veins of bilateral lower extremities with other complications 123456  . Acute pain of left knee 01/25/2017  . Swelling of left knee joint 01/25/2017  . S/P hysterectomy 09/21/2014  . Lapband APS July 2015 02/17/2014  . Edema-chronic pitting in legs 01/20/2014  . Morbid obesity ([place]) 08/28/2013   Past Medical History:  Diagnosis Date  . Arthritis   . Asthma    EXERCISED INDUCED 2014   . Depression   . Fibroids   . Obesity   . Seasonal allergies   . Varicose vein of leg     Family History  Problem Relation Age of Onset  . Cancer Mother        breast/bilateral mastectomies  . Diabetes Other   . Hypertension Other   . Stroke Other   . Obesity Other     Past Surgical History:  Procedure Laterality Date  . BILATERAL SALPINGECTOMY Bilateral 09/21/2014   Procedure: BILATERAL SALPINGECTOMY;  Surgeon: Marvene Staff, MD;  Location: [facility];  Service: Gynecology;  Laterality: Bilateral;  . CESAREAN SECTION  1989  . COLONOSCOPY    . LAPAROSCOPIC GASTRIC BANDING N/A 02/17/2014   Procedure: LAPAROSCOPIC GASTRIC BANDING;  Surgeon: Pedro Earls,  MD;  Location[facility];  Service: General;  Laterality: N/A;  . ROBOT ASSISTED MYOMECTOMY N/A 09/21/2014   Procedure: ROBOTIC ASSISTED MYOMECTOMY;  Surgeon: Marvene Staff, MD;  Location: [facility];  Service: Gynecology;  Laterality: N/A;  . ROBOTIC ASSISTED TOTAL HYSTERECTOMY N/A 09/21/2014   Procedure: ROBOTIC ASSISTED TOTAL HYSTERECTOMY, EXCISION OF PELVIC ENDOMETRIOSIS;  Surgeon: Marvene Staff, MD;  Location: [facility];  Service: Gynecology;  Laterality: N/A;  . TONSILLECTOMY    . TOTAL KNEE ARTHROPLASTY Left 07/03/2017   Procedure: LEFT TOTAL KNEE ARTHROPLASTY;  Surgeon: Meredith Pel, MD;  Location: [place];  Service: Orthopedics;  Laterality: Left;  . TOTAL KNEE ARTHROPLASTY Right 07/10/2019   Procedure: RIGHT TOTAL  KNEE ARTHROPLASTY;  Surgeon: Meredith Pel, MD;  Location: [place];  Service: Orthopedics;  Laterality: Right;   Social History   Occupational History  . Not on file  Tobacco Use  . Smoking status: Never Smoker  . Smokeless tobacco: Never Used  Substance and Sexual Activity  . Alcohol use: No  . Drug use: No  . Sexual activity: Not Currently    Birth control/protection: None

## 2019-08-26 ENCOUNTER — Encounter: Payer: 59 | Admitting: Physical Therapy

## 2019-08-29 ENCOUNTER — Encounter: Payer: 59 | Admitting: Physical Therapy

## 2019-09-02 ENCOUNTER — Encounter: Payer: 59 | Admitting: Physical Therapy

## 2019-09-05 ENCOUNTER — Encounter: Payer: 59 | Admitting: Physical Therapy

## 2019-09-09 ENCOUNTER — Encounter: Payer: 59 | Admitting: Physical Therapy

## 2019-09-12 ENCOUNTER — Encounter: Payer: 59 | Admitting: Physical Therapy

## 2019-09-15 ENCOUNTER — Other Ambulatory Visit: Payer: Self-pay | Admitting: Surgical

## 2019-09-15 ENCOUNTER — Telehealth: Payer: Self-pay | Admitting: Radiology

## 2019-09-15 MED ORDER — IBUPROFEN 800 MG PO TABS: 800.0000 mg | ORAL_TABLET | Freq: Three times a day (TID) | ORAL | 0 refills | Status: DC | PRN

## 2019-09-15 NOTE — Telephone Encounter (Signed)
Pls advise. Thanks.  

## 2019-09-15 NOTE — Telephone Encounter (Signed)
Mary Morrison patient.  Received fax from pharmacy requesting refill on Ibuprofen 800mg  1 po tid #60. Mary Morrison last wrote rx 07/24/2019.

## 2019-09-16 ENCOUNTER — Encounter: Payer: 59 | Admitting: Physical Therapy

## 2019-09-19 ENCOUNTER — Encounter: Payer: 59 | Admitting: Physical Therapy

## 2019-09-23 ENCOUNTER — Encounter: Payer: 59 | Admitting: Physical Therapy

## 2019-09-26 ENCOUNTER — Encounter: Payer: 59 | Admitting: Physical Therapy

## 2019-10-02 ENCOUNTER — Other Ambulatory Visit: Payer: Self-pay

## 2019-10-02 ENCOUNTER — Ambulatory Visit (INDEPENDENT_AMBULATORY_CARE_PROVIDER_SITE_OTHER): Payer: 59 | Admitting: Orthopedic Surgery

## 2019-10-02 ENCOUNTER — Encounter: Payer: Self-pay | Admitting: Orthopedic Surgery

## 2019-10-02 VITALS — Wt 255.6 lb

## 2019-10-02 DIAGNOSIS — Z96651 Presence of right artificial knee joint: Secondary | ICD-10-CM

## 2019-10-02 NOTE — Progress Notes (Signed)
Post-Op Visit Note   Patient: Mary Morrison           Date of Birth: [birth year]           MRN: [id number] Visit Date: 10/02/2019 PCP: Harlan Stains, MD   Assessment & Plan:  Chief Complaint:  Chief Complaint  Patient presents with  . Right Knee - Pain   Visit Diagnoses:  1. S/P TKR (total knee replacement), right     Plan: Patient is now about 3 months out right total knee replacement.  She is doing better.  Has a little bit of pain in the posterior aspect of the knee with some types of ambulation and weight training.  She is working on weight loss.  She has dogs which scratch her occasionally.  She does have a scratch on the anterior tibia which has no surrounding erythema or drainage.  I did caution her about not having any type of skin breakage or infection producing injury in that lower extremity below either total knee replacement.  On exam she has full extension and flexion to about 10 5-1 10.  No effusion in the right knee.  Plan at this time is to continue working on range of motion exercises.  Follow-up with me as needed in 9 months for clinical recheck.  I think she will have plateaued in terms of pain relief and range of motion at that time.  Weight today is 255.  She wants to keep track of that for weight reduction purposes.  She may come in in 3 months just for a weigh-in and she will call Lauren about that.  That would of course be a no charge visit just to get weighed.  Follow-Up Instructions: Return in about 9 months (around 07/03/2020).   Orders:  No orders of the defined types were placed in this encounter.  No orders of the defined types were placed in this encounter.   Imaging: No results found.  PMFS History: Patient Active Problem List   Diagnosis Date Noted  . Arthritis of right knee 07/10/2019  . Arthritis of knee 07/03/2017  . Varicose veins of bilateral lower extremities with other complications 123456  . Acute pain of left knee 01/25/2017   . Swelling of left knee joint 01/25/2017  . S/P hysterectomy 09/21/2014  . Lapband APS July 2015 02/17/2014  . Edema-chronic pitting in legs 01/20/2014  . Morbid obesity ([place]) 08/28/2013   Past Medical History:  Diagnosis Date  . Arthritis   . Asthma    EXERCISED INDUCED 2014   . Depression   . Fibroids   . Obesity   . Seasonal allergies   . Varicose vein of leg     Family History  Problem Relation Age of Onset  . Cancer Mother        breast/bilateral mastectomies  . Diabetes Other   . Hypertension Other   . Stroke Other   . Obesity Other     Past Surgical History:  Procedure Laterality Date  . BILATERAL SALPINGECTOMY Bilateral 09/21/2014   Procedure: BILATERAL SALPINGECTOMY;  Surgeon: Marvene Staff, MD;  Location: [facility];  Service: Gynecology;  Laterality: Bilateral;  . CESAREAN SECTION  1989  . COLONOSCOPY    . LAPAROSCOPIC GASTRIC BANDING N/A 02/17/2014   Procedure: LAPAROSCOPIC GASTRIC BANDING;  Surgeon: Pedro Earls, MD;  [facility];  Service: General;  Laterality: N/A;  . ROBOT ASSISTED MYOMECTOMY N/A 09/21/2014   Procedure: ROBOTIC ASSISTED MYOMECTOMY;  Surgeon: Marvene Staff,  MD;  Location: [facility];  Service: Gynecology;  Laterality: N/A;  . ROBOTIC ASSISTED TOTAL HYSTERECTOMY N/A 09/21/2014   Procedure: ROBOTIC ASSISTED TOTAL HYSTERECTOMY, EXCISION OF PELVIC ENDOMETRIOSIS;  Surgeon: Marvene Staff, MD;  Location: [facility];  Service: Gynecology;  Laterality: N/A;  . TONSILLECTOMY    . TOTAL KNEE ARTHROPLASTY Left 07/03/2017   Procedure: LEFT TOTAL KNEE ARTHROPLASTY;  Surgeon: Meredith Pel, MD;  Location: [place];  Service: Orthopedics;  Laterality: Left;  . TOTAL KNEE ARTHROPLASTY Right 07/10/2019   Procedure: RIGHT TOTAL KNEE ARTHROPLASTY;  Surgeon: Meredith Pel, MD;  Location: [place];  Service: Orthopedics;  Laterality: Right;   Social History   Occupational History  . Not on file  Tobacco Use  . Smoking status: Never  Smoker  . Smokeless tobacco: Never Used  Substance and Sexual Activity  . Alcohol use: No  . Drug use: No  . Sexual activity: Not Currently    Birth control/protection: None

## 2019-10-24 ENCOUNTER — Other Ambulatory Visit: Payer: Self-pay | Admitting: Surgical

## 2019-12-03 ENCOUNTER — Other Ambulatory Visit: Payer: Self-pay | Admitting: Surgical

## 2019-12-03 NOTE — Telephone Encounter (Signed)
Pls advise. Thanks.  

## 2019-12-24 ENCOUNTER — Telehealth: Payer: Self-pay

## 2019-12-24 ENCOUNTER — Other Ambulatory Visit: Payer: Self-pay | Admitting: Surgical

## 2019-12-24 MED ORDER — IBUPROFEN 800 MG PO TABS: ORAL_TABLET | ORAL | 2 refills | Status: AC

## 2019-12-24 NOTE — Telephone Encounter (Signed)
submitted

## 2019-12-24 NOTE — Telephone Encounter (Signed)
Patient request refill on ibuprofen 800mg  sent to [facility] [address]

## 2020-01-27 ENCOUNTER — Telehealth: Payer: Self-pay | Admitting: Hematology

## 2020-01-27 NOTE — Telephone Encounter (Signed)
Received a new hem referral from Dr. Dema Severin from [facility] for macrocytosis. Mary Morrison has been cld and scheduled to see Dr. Irene Limbo on 7/15 at 11am. Pt aware to arrive 15 minutes early.

## 2020-02-04 NOTE — Progress Notes (Signed)
HEMATOLOGY/ONCOLOGY CONSULTATION NOTE  Date of Service: 02/05/2020  Patient Care Team: Harlan Stains, MD as PCP - General (Family Medicine) Kennith Center, RD as Dietitian (Family Medicine)  REFERRING PHYSICIAN: Harlan Stains, MD  CHIEF COMPLAINTS/PURPOSE OF CONSULTATION:  Macrocytosis   HISTORY OF PRESENTING ILLNESS:  Mary Morrison is a wonderful 54 y.o. female who has been referred to [place] by Harlan Stains, MD for evaluation and management of macrocytosis. The pt reports that she is doing well overall.   The pt reports she is good. Pt has been taking Vitamin B12 supplement and iron supplement. She has been hurting all over and hardly able to walk in the morning. It has been specifically her right shoulder to arm numbness and weakness. She has wound healing issues and wounds take longer to heal. Pt has chronic pedal edema. Pt previously had lap band surgery and she has not restrictions. The fluid has been taken out.   She is not on acid suppressants. 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 She has not had thyroid issues and knows of no known thyroid issues within the family. Pt has been generically tested and has no mutations. She is keeping up with her age appropriate cancer screenings. Pt has not had liver problems like fatty liver or enzyme abnormalities. She has not gotten an abdominal CT in the last few years.   Most recent lab results (01/16/20) of CBC is as follows: all values are WNL except for RBC at 3.91, MCV at 103.7, MCH at 34.6, Lymph# at 1, Glucose at 115, GFR (Non-African-American) at 55, Sodium at 147, CO2 at 33  01/16/20 of Vitamin B12 at 255: WNL 33/83/29 of Folate (Folic Acid), Serum at 8.9: WNL  On review of systems, pt reports pedal edema, fatigue, general pain/ache, night sweats, hot flashes and denies bleeding issues, unexpected change in weight, blood/black stool, gum bleeds, nose bleeds, fevers, chills and any other symptoms.   On PMHx the pt reports 2  knee replacements, bariatric surgery (lap band), chronic pedal edema   On Social Hx the pt reports 20 years sober, no cigarette use   On Family Hx the pt reports mother breast cancer, and vaginal cancer, maternal grandmother myocardial infarction, maternal grandfather CVA, no blood disorders   MEDICAL HISTORY:  Past Medical History:  Diagnosis Date  . Arthritis   . Asthma    EXERCISED INDUCED 2014   . Depression   . Fibroids   . Obesity   . Seasonal allergies   . Varicose vein of leg      SURGICAL HISTORY: Past Surgical History:  Procedure Laterality Date  . BILATERAL SALPINGECTOMY Bilateral 09/21/2014   Procedure: BILATERAL SALPINGECTOMY;  Surgeon: Marvene Staff, MD;  Location: [facility];  Service: Gynecology;  Laterality: Bilateral;  . CESAREAN SECTION  1989  . COLONOSCOPY    . LAPAROSCOPIC GASTRIC BANDING N/A 02/17/2014   Procedure: LAPAROSCOPIC GASTRIC BANDING;  Surgeon: Pedro Earls, MD;  Location: [facility];  Service: General;  Laterality: N/A;  . ROBOT ASSISTED MYOMECTOMY N/A 09/21/2014   Procedure: ROBOTIC ASSISTED MYOMECTOMY;  Surgeon: Marvene Staff, MD;  Location: [facility];  Service: Gynecology;  Laterality: N/A;  . ROBOTIC ASSISTED TOTAL HYSTERECTOMY N/A 09/21/2014   Procedure: ROBOTIC ASSISTED TOTAL HYSTERECTOMY, EXCISION OF PELVIC ENDOMETRIOSIS;  Surgeon: Marvene Staff, MD;  Location: [facility];  Service: Gynecology;  Laterality: N/A;  . TONSILLECTOMY    . TOTAL KNEE ARTHROPLASTY Left 07/03/2017   Procedure: LEFT TOTAL  KNEE ARTHROPLASTY;  Surgeon: Meredith Pel, MD;  Location: [facility];  Service: Orthopedics;  Laterality: Left;  . TOTAL KNEE ARTHROPLASTY Right 07/10/2019   Procedure: RIGHT TOTAL KNEE ARTHROPLASTY;  Surgeon: Meredith Pel, MD;  Location: [facility];  Service: Orthopedics;  Laterality: Right;     SOCIAL HISTORY: Social History   Socioeconomic History  . Marital status: Divorced    Spouse name: Not on file  . Number of children:  Not on file  . Years of education: Not on file  . Highest education level: Not on file  Occupational History  . Not on file  Tobacco Use  . Smoking status: Never Smoker  . Smokeless tobacco: Never Used  Vaping Use  . Vaping Use: Never used  Substance and Sexual Activity  . Alcohol use: No  . Drug use: No  . Sexual activity: Not Currently    Birth control/protection: None  Other Topics Concern  . Not on file  Social History Narrative  . Not on file   Social Determinants of Health   Financial Resource Strain:   . Difficulty of Paying Living Expenses:   Food Insecurity:   . Worried About Charity fundraiser in the Last Year:   . Arboriculturist in the Last Year:   Transportation Needs:   . Film/video editor (Medical):   Marland Kitchen Lack of Transportation (Non-Medical):   Physical Activity:   . Days of Exercise per Week:   . Minutes of Exercise per Session:   Stress:   . Feeling of Stress :   Social Connections:   . Frequency of Communication with Friends and Family:   . Frequency of Social Gatherings with Friends and Family:   . Attends Religious Services:   . Active Member of Clubs or Organizations:   . Attends Archivist Meetings:   Marland Kitchen Marital Status:   Intimate Partner Violence:   . Fear of Current or Ex-Partner:   . Emotionally Abused:   Marland Kitchen Physically Abused:   . Sexually Abused:      FAMILY HISTORY: Family History  Problem Relation Age of Onset  . Cancer Mother        breast/bilateral mastectomies  . Diabetes Other   . Hypertension Other   . Stroke Other   . Obesity Other      ALLERGIES:   has No Known Allergies.   MEDICATIONS:  Current Outpatient Medications  Medication Sig Dispense Refill  . albuterol (VENTOLIN HFA) 108 (90 Base) MCG/ACT inhaler Inhale 1-2 puffs into the lungs every 6 (six) hours as needed for wheezing or shortness of breath.    Marland Kitchen amoxicillin (AMOXIL) 500 MG tablet Take 2 grams 1 hour prior to dental procedure. (Patient  taking differently: Take 2,000 mg by mouth See admin instructions. Take 2 grams by mouth 1 hour prior to dental procedure.) 16 tablet 0  . Ascorbic Acid (VITAMIN C) 1000 MG tablet Take 1,000 mg by mouth daily.    Marland Kitchen buPROPion (WELLBUTRIN XL) 300 MG 24 hr tablet Take 300 mg by mouth daily.     . Calcium-Vitamin D-Vitamin K (VIACTIV PO) Take 1 tablet by mouth daily.    . Cyanocobalamin (B-12 PO) Take 1 tablet by mouth daily. Patient is unsure of dose    . diclofenac Sodium (VOLTAREN) 1 % GEL Apply 2 g topically daily as needed (pain).    Marland Kitchen escitalopram (LEXAPRO) 10 MG tablet Take 10 mg by mouth at bedtime.    . Ferrous  Sulfate (IRON PO) Take 65 mg by mouth daily.    Marland Kitchen ibuprofen (ADVIL) 800 MG tablet TAKE 1 TABLET BY MOUTH EVERY 8 (EIGHT) HOURS AS NEEDED FOR MODERATE PAIN (MUST TAKE WITH FOOD). 60 tablet 2  . Menthol, Topical Analgesic, (BIOFREEZE EX) Apply 1 application topically daily as needed (pain).    . Multiple Vitamins-Minerals (EMERGEN-C VITAMIN C PO) Take 1 tablet by mouth daily.    . Probiotic Product (PROBIOTIC PO) Take 1 capsule by mouth daily.    . traMADol (ULTRAM) 50 MG tablet Take 1 tablet (50 mg total) by mouth every 6 (six) hours as needed. 60 tablet 0  . traZODone (DESYREL) 50 MG tablet Take 100 mg by mouth at bedtime.     . triamterene-hydrochlorothiazide (MAXZIDE-25) 37.5-25 MG tablet Take 1 tablet by mouth daily.   0  . zinc gluconate 50 MG tablet Take 50 mg by mouth daily.    Marland Kitchen aspirin 81 MG chewable tablet Chew 1 tablet (81 mg total) by mouth 2 (two) times daily. (Patient not taking: Reported on 02/05/2020) 60 tablet 0  . docusate sodium (COLACE) 100 MG capsule Take 1 capsule (100 mg total) by mouth 2 (two) times daily. (Patient not taking: Reported on 08/08/2019) 10 capsule 0  . methocarbamol (ROBAXIN) 500 MG tablet Take 1 tablet (500 mg total) by mouth every 8 (eight) hours as needed for muscle spasms. (Patient not taking: Reported on 08/08/2019) 30 tablet 0   No current  facility-administered medications for this visit.     REVIEW OF SYSTEMS:   A 10+ POINT REVIEW OF SYSTEMS WAS OBTAINED including neurology, dermatology, psychiatry, cardiac, respiratory, lymph, extremities, GI, GU, Musculoskeletal, constitutional, breasts, reproductive, HEENT.  All pertinent positives are noted in the HPI.  All others are negative.   PHYSICAL EXAMINATION: ECOG PERFORMANCE STATUS: 1 - Symptomatic but completely ambulatory  Vitals:   02/05/20 1133  BP: 135/82  Pulse: 86  Resp: 18  Temp: 97.9 F (36.6 C)  SpO2: 98%   Filed Weights   02/05/20 1133  Weight: 258 lb 8 oz (117.3 kg)   Body mass index is 41.72 kg/m.  GENERAL:alert, in no acute distress and comfortable SKIN: no acute rashes, no significant lesions EYES: conjunctiva are pink and non-injected, sclera anicteric OROPHARYNX: MMM, no exudates, no oropharyngeal erythema or ulceration NECK: supple, no JVD LYMPH:  no palpable lymphadenopathy in the cervical, axillary or inguinal regions LUNGS: clear to auscultation b/l with normal respiratory effort HEART: regular rate & rhythm ABDOMEN:  normoactive bowel sounds , non tender, not distended. Extremity: 2+ pedal edema PSYCH: alert & oriented x 3 with fluent speech NEURO: no focal motor/sensory deficits  LABORATORY DATA:  I have reviewed the data as listed  CBC Latest Ref Rng & Units 02/05/2020 07/07/2019 06/27/2017  WBC 4.0 - 10.5 K/uL 4.8 5.2 6.9  Hemoglobin 12.0 - 15.0 g/dL 13.8 13.1 13.8  Hematocrit 36 - 46 % 42.1 41.0 42.7  Platelets 150 - 400 K/uL 250 207 251    CMP Latest Ref Rng & Units 02/05/2020 07/07/2019 06/27/2017  Glucose 70 - 99 mg/dL 97 105(H) 106(H)  BUN 6 - 20 mg/dL _0 Creatinine 0.44 - 1.00 mg/dL 0.87 0.88 0.93  Sodium 135 - 145 mmol/L 139 140 136  Potassium 3.5 - 5.1 mmol/L 4.6 3.7 3.5  Chloride 98 - 111 mmol/L 104 103 100(L)  CO2 22 - 32 mmol/L _1 Calcium 8.9 - 10.3 mg/dL 9.9 9.0 9.0  Total Protein 6.5 -  8.1 g/dL 7.5  - -  Total Bilirubin 0.3 - 1.2 mg/dL 0.5 - -  Alkaline Phos 38 - 126 U/L 71 - -  AST 15 - 41 U/L 22 - -  ALT 0 - 44 U/L 24 - -      RADIOGRAPHIC STUDIES: I have personally reviewed the radiological images as listed and agreed with the findings in the report. No results found.   ASSESSMENT & PLAN:  MICHAELEEN DOWN is a 54 y.o. female with:  1. RBC MAcrocytosis without significant anemia   PLAN: -Discussed patient's most recent labs from 01/16/20, RBC at 3.91, MCV at 103.7, MCH at 34.6, Lymph# at 1Glucose at 115, GFR (Non-African-American) at 55, Sodium at 147, CO2 at 33  -Discussed 01/16/20 of Vitamin B12 at 255: WNL -Discussed 35/00/93 of Folate (Folic Acid), Serum at 8.9: WNL -Advised on reasons for RBC to be larger- nutritional deficiencies, alcohol use, bariatric surgery, thyroid dysfunction  -Advised on MDS -Advised on conservative approach (B-complex supplement and Vitamin B12 supplement and watch in several months) or more active approach (bone marrow biopsy) -Recommends [place] Abd in 1 week -Recommends taking B-complex supplement and Vitamin B12 supplement  -Recommends keeping up with age appropriate cancer screenings -Recommends getting labs today -Recommends f/u with PCP about joint pain  -Will see back in 2 weeks via phone visit   [place] today  [place] abd in 1 week Phone visit with Dr Irene Limbo in about 2 weeks  . Orders Placed This Encounter  Procedures  . US Abdomen Complete    Standing Status:   Future    Standing Expiration Date:   02/04/2021    Order Specific Question:   Reason for Exam (SYMPTOM  OR DIAGNOSIS REQUIRED)    Answer:   Patient with h/o ETOH abuse - plz evaluate for presence of liver disease/cirrhosis and splenomegaly.    Order Specific Question:   Preferred imaging location?    Answer:   [facility]  . CBC with Differential/Platelet    Standing Status:   Future    Number of Occurrences:   1    Standing Expiration Date:   02/04/2021    . CMP ([place] only)    Standing Status:   Future    Number of Occurrences:   1    Standing Expiration Date:   02/04/2021  . Lactate dehydrogenase    Standing Status:   Future    Number of Occurrences:   1    Standing Expiration Date:   02/04/2021  . Haptoglobin    Standing Status:   Future    Number of Occurrences:   1    Standing Expiration Date:   02/04/2021  . Vitamin B12    Standing Status:   Future    Number of Occurrences:   1    Standing Expiration Date:   02/04/2021  . Methylmalonic acid, serum    Standing Status:   Future    Number of Occurrences:   1    Standing Expiration Date:   02/04/2021  . Folate RBC    Standing Status:   Future    Number of Occurrences:   1    Standing Expiration Date:   02/04/2021  . Multiple Myeloma Panel (SPEP&IFE w/QIG)    Standing Status:   Future    Number of Occurrences:   1    Standing Expiration Date:   02/04/2021  . Copper, serum    Standing Status:   Future  Number of Occurrences:   1    Standing Expiration Date:   02/04/2021  . Vitamin D 25 hydroxy    Standing Status:   Future    Number of Occurrences:   1    Standing Expiration Date:   02/04/2021  . Reticulocytes    Standing Status:   Future    Number of Occurrences:   1    Standing Expiration Date:   02/04/2021  . Vitamin B1    Standing Status:   Future    Number of Occurrences:   1    Standing Expiration Date:   02/04/2021    The total time spent in the appt was 45 minutes and more than 50% was on counseling and direct patient cares.  All of the patient's questions were answered with apparent satisfaction. The patient knows to call the clinic with any problems, questions or concerns.   Sullivan Lone MD MS AAHIVMS [facility] Hematology/Oncology Physician [facility]  (Office):       [phone] (Work cell):  [phone] (Fax):           [phone]  02/05/2020 2:17 PM  I, Dawayne Cirri am acting as a Education administrator for Dr. Sullivan Lone.   .I have reviewed  the above documentation for accuracy and completeness, and I agree with the above. Brunetta Genera MD

## 2020-02-05 ENCOUNTER — Telehealth: Payer: Self-pay | Admitting: Hematology

## 2020-02-05 ENCOUNTER — Inpatient Hospital Stay: Payer: 59 | Attending: Hematology | Admitting: Hematology

## 2020-02-05 ENCOUNTER — Inpatient Hospital Stay: Payer: 59

## 2020-02-05 ENCOUNTER — Other Ambulatory Visit: Payer: Self-pay

## 2020-02-05 VITALS — BP 135/82 | HR 86 | Temp 97.9°F | Resp 18 | Ht 66.0 in | Wt 258.5 lb

## 2020-02-05 DIAGNOSIS — D7589 Other specified diseases of blood and blood-forming organs: Secondary | ICD-10-CM | POA: Insufficient documentation

## 2020-02-05 DIAGNOSIS — Z9884 Bariatric surgery status: Secondary | ICD-10-CM | POA: Diagnosis not present

## 2020-02-05 DIAGNOSIS — R531 Weakness: Secondary | ICD-10-CM | POA: Diagnosis not present

## 2020-02-05 DIAGNOSIS — Z96653 Presence of artificial knee joint, bilateral: Secondary | ICD-10-CM | POA: Insufficient documentation

## 2020-02-05 DIAGNOSIS — R200 Anesthesia of skin: Secondary | ICD-10-CM | POA: Diagnosis not present

## 2020-02-05 DIAGNOSIS — Z803 Family history of malignant neoplasm of breast: Secondary | ICD-10-CM | POA: Insufficient documentation

## 2020-02-05 DIAGNOSIS — D539 Nutritional anemia, unspecified: Secondary | ICD-10-CM

## 2020-02-05 DIAGNOSIS — R600 Localized edema: Secondary | ICD-10-CM | POA: Diagnosis not present

## 2020-02-05 LAB — LACTATE DEHYDROGENASE: LDH: 226 U/L — ABNORMAL HIGH (ref 98–192)

## 2020-02-05 LAB — CMP (CANCER CENTER ONLY)
ALT: 24 U/L (ref 0–44)
AST: 22 U/L (ref 15–41)
Albumin: 4.1 g/dL (ref 3.5–5.0)
Alkaline Phosphatase: 71 U/L (ref 38–126)
Anion gap: 11 (ref 5–15)
BUN: 15 mg/dL (ref 6–20)
CO2: 24 mmol/L (ref 22–32)
Calcium: 9.9 mg/dL (ref 8.9–10.3)
Chloride: 104 mmol/L (ref 98–111)
Creatinine: 0.87 mg/dL (ref 0.44–1.00)
GFR, Est AFR Am: >60 mL/min (ref >60)
GFR, Estimated: >60 mL/min (ref >60)
Glucose, Bld: 97 mg/dL (ref 70–99)
Potassium: 4.6 mmol/L (ref 3.5–5.1)
Sodium: 139 mmol/L (ref 135–145)
Total Bilirubin: 0.5 mg/dL (ref 0.3–1.2)
Total Protein: 7.5 g/dL (ref 6.5–8.1)

## 2020-02-05 LAB — CBC WITH DIFFERENTIAL/PLATELET
Abs Immature Granulocytes: 0.01 10*3/uL (ref 0.00–0.07)
Basophils Absolute: 0 10*3/uL (ref 0.0–0.1)
Basophils Relative: 0 %
Eosinophils Absolute: 0.1 10*3/uL (ref 0.0–0.5)
Eosinophils Relative: 2 %
HCT: 42.1 % (ref 36.0–46.0)
Hemoglobin: 13.8 g/dL (ref 12.0–15.0)
Immature Granulocytes: 0 %
Lymphocytes Relative: 23 %
Lymphs Abs: 1.1 10*3/uL (ref 0.7–4.0)
MCH: 34 pg (ref 26.0–34.0)
MCHC: 32.8 g/dL (ref 30.0–36.0)
MCV: 103.7 fL — ABNORMAL HIGH (ref 80.0–100.0)
Monocytes Absolute: 0.5 10*3/uL (ref 0.1–1.0)
Monocytes Relative: 10 %
Neutro Abs: 3.1 10*3/uL (ref 1.7–7.7)
Neutrophils Relative %: 65 %
Platelets: 250 10*3/uL (ref 150–400)
RBC: 4.06 MIL/uL (ref 3.87–5.11)
RDW: 12.9 % (ref 11.5–15.5)
WBC: 4.8 10*3/uL (ref 4.0–10.5)
nRBC: 0 % (ref 0.0–0.2)

## 2020-02-05 LAB — RETICULOCYTES
Immature Retic Fract: 12.8 % (ref 2.3–15.9)
RBC.: 4.08 MIL/uL (ref 3.87–5.11)
Retic Count, Absolute: 46.5 10*3/uL (ref 19.0–186.0)
Retic Ct Pct: 1.1 % (ref 0.4–3.1)

## 2020-02-05 LAB — VITAMIN D 25 HYDROXY (VIT D DEFICIENCY, FRACTURES): Vit D, 25-Hydroxy: 31.16 ng/mL (ref 30–100)

## 2020-02-05 LAB — VITAMIN B12: Vitamin B-12: 395 pg/mL (ref 180–914)

## 2020-02-05 NOTE — Telephone Encounter (Signed)
Scheduled per 7/15 los. Printed avs for pt. Pt is aware of appt on 8/3.

## 2020-02-05 NOTE — Patient Instructions (Signed)
Thank you for choosing [facility] to provide your oncology and hematology care.   Should you have questions after your visit to the [facility] ([facility]), please contact this office at [phone] between 8:30 AM and 4:30 PM.  Voice mails left after 4:00 PM may not be returned until the following business day.  Calls received after 4:30 PM will be answered by an off-site Nurse Triage Line.    Prescription Refills:  Please have your pharmacy contact us directly for most prescription requests.  Contact the office directly for refills of narcotics (pain medications). Allow 48-72 hours for refills.  Appointments: Please contact the [facility] scheduling department [phone] for questions regarding [facility] appointment scheduling.  Contact the schedulers with any scheduling changes so that your appointment can be rescheduled in a timely manner.   Central Scheduling for [place] [phone] - Call to schedule procedures such as PET scans, CT scans, MRI, Ultrasound, etc.  To afford each patient quality time with our providers, please arrive 30 minutes before your scheduled appointment time.  If you arrive late for your appointment, you may be asked to reschedule.  We strive to give you quality time with our providers, and arriving late affects you and other patients whose appointments are after yours. If you are a no show for multiple scheduled visits, you may be dismissed from the clinic at the providers discretion.     Resources: [facility] Social Workers [phone] for additional information on assistance programs or assistance connecting with community support programs   [place] DSS  [phone]: Information regarding food stamps, Medicaid, and utility assistance [place] Access [place] [phone]   [employer]'s shared-ride transportation service for eligible riders who have a disability that prevents them from riding the fixed route bus.   Medicare  Rights Center 800-333-4114 Helps people with Medicare understand their rights and benefits, navigate the Medicare system, and secure the quality healthcare they deserve [employer] 800-227-2345 Assists patients locate various types of support and financial assistance Cancer Care: 1-800-813-HOPE (4673) Provides financial assistance, online support groups, medication/co-pay assistance.   Transportation Assistance for appointments at [facility]: Transportation Coordinator [phone]  Again, thank you for choosing [facility] for your care.       

## 2020-02-06 LAB — FOLATE RBC
Folate, Hemolysate: 486 ng/mL
Folate, RBC: 1136 ng/mL (ref >498)
Hematocrit: 42.8 % (ref 34.0–46.6)

## 2020-02-06 LAB — HAPTOGLOBIN: Haptoglobin: 113 mg/dL (ref 33–346)

## 2020-02-07 LAB — COPPER, SERUM: Copper: 102 ug/dL (ref 80–158)

## 2020-02-08 LAB — METHYLMALONIC ACID, SERUM: Methylmalonic Acid, Quantitative: 419 nmol/L — ABNORMAL HIGH (ref 0–378)

## 2020-02-09 LAB — MULTIPLE MYELOMA PANEL, SERUM
Albumin SerPl Elph-Mcnc: 4.1 g/dL (ref 2.9–4.4)
Albumin/Glob SerPl: 1.5 (ref 0.7–1.7)
Alpha 1: 0.2 g/dL (ref 0.0–0.4)
Alpha2 Glob SerPl Elph-Mcnc: 0.6 g/dL (ref 0.4–1.0)
B-Globulin SerPl Elph-Mcnc: 1 g/dL (ref 0.7–1.3)
Gamma Glob SerPl Elph-Mcnc: 0.9 g/dL (ref 0.4–1.8)
Globulin, Total: 2.8 g/dL (ref 2.2–3.9)
IgA: 356 mg/dL — ABNORMAL HIGH (ref 87–352)
IgG (Immunoglobin G), Serum: 579 mg/dL — ABNORMAL LOW (ref 586–1602)
IgM (Immunoglobulin M), Srm: 89 mg/dL (ref 26–217)
M Protein SerPl Elph-Mcnc: 0.3 g/dL — ABNORMAL HIGH
Total Protein ELP: 6.9 g/dL (ref 6.0–8.5)

## 2020-02-09 LAB — VITAMIN B1: Vitamin B1 (Thiamine): 108.6 nmol/L (ref 66.5–200.0)

## 2020-02-19 ENCOUNTER — Other Ambulatory Visit: Payer: Self-pay

## 2020-02-19 ENCOUNTER — Ambulatory Visit (HOSPITAL_COMMUNITY): Payer: 59

## 2020-02-19 ENCOUNTER — Ambulatory Visit (HOSPITAL_COMMUNITY)
Admission: RE | Admit: 2020-02-19 | Discharge: 2020-02-19 | Disposition: A | Discharge status: Home / Self Care | Payer: 59 | Source: Ambulatory Visit | Attending: Hematology | Admitting: Hematology

## 2020-02-19 DIAGNOSIS — D539 Nutritional anemia, unspecified: Secondary | ICD-10-CM | POA: Insufficient documentation

## 2020-02-23 ENCOUNTER — Telehealth: Payer: Self-pay | Admitting: Hematology

## 2020-02-23 NOTE — Telephone Encounter (Signed)
Contacted patient to verify telephone visit for pre reg °

## 2020-02-23 NOTE — Progress Notes (Signed)
HEMATOLOGY/ONCOLOGY CONSULTATION NOTE  Date of Service: 02/24/2020  Patient Care Team: Harlan Stains, MD as PCP - General (Family Medicine) Kennith Center, RD as Dietitian (Family Medicine)  REFERRING PHYSICIAN: Harlan Stains, MD  CHIEF COMPLAINTS/PURPOSE OF CONSULTATION:  Macrocytosis   HISTORY OF PRESENTING ILLNESS:  Mary Morrison is a wonderful 54 y.o. female who has been referred to [place] by Harlan Stains, MD for evaluation and management of macrocytosis. The pt reports that she is doing well overall.   The pt reports she is good. Pt has been taking Vitamin B12 supplement and iron supplement. She has been hurting all over and hardly able to walk in the morning. It has been specifically her right shoulder to arm numbness and weakness. She has wound healing issues and wounds take longer to heal. Pt has chronic pedal edema. Pt previously had lap band surgery and she has not restrictions. The fluid has been taken out.   She is not on acid suppressants. 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 She has not had thyroid issues and knows of no known thyroid issues within the family. Pt has been generically tested and has no mutations. She is keeping up with her age appropriate cancer screenings. Pt has not had liver problems like fatty liver or enzyme abnormalities. She has not gotten an abdominal CT in the last few years.   Most recent lab results (01/16/20) of CBC is as follows: all values are WNL except for RBC at 3.91, MCV at 103.7, MCH at 34.6, Lymph# at 1, Glucose at 115, GFR (Non-African-American) at 55, Sodium at 147, CO2 at 33  01/16/20 of Vitamin B12 at 255: WNL 95/09/32 of Folate (Folic Acid), Serum at 8.9: WNL  On review of systems, pt reports pedal edema, fatigue, general pain/ache, night sweats, hot flashes and denies bleeding issues, unexpected change in weight, blood/black stool, gum bleeds, nose bleeds, fevers, chills and any other symptoms.   On PMHx the pt reports 2  knee replacements, bariatric surgery (lap band), chronic pedal edema   On Social Hx the pt reports 20 years sober, no cigarette use   On Family Hx the pt reports mother breast cancer, and vaginal cancer, maternal grandmother myocardial infarction, maternal grandfather CVA, no blood disorders   INTERVAL HISTORY:   I connected with  Mary Morrison on 02/24/20 by telephone and verified that I am speaking with the correct person using two identifiers.   I discussed the limitations of evaluation and management by telemedicine. The patient expressed understanding and agreed to proceed.  Other persons participating in the visit and their role in the encounter:       -Yevette Edwards, Medical Scribe  Patient's location: Home  Provider's location: [place] [facility] [place] is a wonderful 54 y.o. female who is here for evaluation and management of macrocytosis. The patient's last visit with [place] was on 02/05/2020. The pt reports that she is doing well overall.  The pt reports that she has no new concerns or symptoms. She currently takes Vitamin C, Vitamin D, and Zinc.  Of note since the patient's last visit, pt has had [place] Abd ([id number]) completed on 02/19/2020 with results revealing "1. Increased hepatic echogenicity consistent fatty infiltration or hepatocellular disease. Liver is prominent 18.5 cm. Benign-appearing 1.5 cm cyst noted right hepatic lobe. Spleen appears unremarkable and measures 13.0 cm. 2.  No acute abnormality. No gallstones or biliary distention."  Lab results (02/05/20) of CBC w/diff and  CMP is as follows: all values are WNL except for MCV at 103.7. 02/05/2020 Reticulocytes is as follows: Retic Ct Pct at 1.1, RBC at 4.08, Retic Ct Abs at 46.5, Immature Retic Fract at 12.8 02/05/2020 MMP is as follows: all values are WNL except for IgG at 579, IgA at 356, M Protein at 0.3  02/05/2020 Folate Hemolysate at 486.0, Folate RBC at 1136, HCT at 42.8 02/05/2020 Vitamin  D 25 (OH) at 31.16 02/05/2020 Methylmalonic acidat 419 02/05/2020 Vitamin B1 at 108.6 02/05/2020 Haptoglobin at 113 02/05/2020 Vitamin B12 at 395 02/05/2020 Copper at 102 02/05/2020 LDH at 226  On review of systems, pt denies any other symptoms.   MEDICAL HISTORY:  Past Medical History:  Diagnosis Date  . Arthritis   . Asthma    EXERCISED INDUCED 2014   . Depression   . Fibroids   . Obesity   . Seasonal allergies   . Varicose vein of leg      SURGICAL HISTORY: Past Surgical History:  Procedure Laterality Date  . BILATERAL SALPINGECTOMY Bilateral 09/21/2014   Procedure: BILATERAL SALPINGECTOMY;  Surgeon: Marvene Staff, MD;  Location: [facility];  Service: Gynecology;  Laterality: Bilateral;  . CESAREAN SECTION  1989  . COLONOSCOPY    . LAPAROSCOPIC GASTRIC BANDING N/A 02/17/2014   Procedure: LAPAROSCOPIC GASTRIC BANDING;  Surgeon: Pedro Earls, MD;  Location: [facility];  Service: General;  Laterality: N/A;  . ROBOT ASSISTED MYOMECTOMY N/A 09/21/2014   Procedure: ROBOTIC ASSISTED MYOMECTOMY;  Surgeon: Marvene Staff, MD;  Location: [facility];  Service: Gynecology;  Laterality: N/A;  . ROBOTIC ASSISTED TOTAL HYSTERECTOMY N/A 09/21/2014   Procedure: ROBOTIC ASSISTED TOTAL HYSTERECTOMY, EXCISION OF PELVIC ENDOMETRIOSIS;  Surgeon: Marvene Staff, MD;  Location: [facility];  Service: Gynecology;  Laterality: N/A;  . TONSILLECTOMY    . TOTAL KNEE ARTHROPLASTY Left 07/03/2017   Procedure: LEFT TOTAL KNEE ARTHROPLASTY;  Surgeon: Meredith Pel, MD;  Location: [facility];  Service: Orthopedics;  Laterality: Left;  . TOTAL KNEE ARTHROPLASTY Right 07/10/2019   Procedure: RIGHT TOTAL KNEE ARTHROPLASTY;  Surgeon: Meredith Pel, MD;  Location: [facility];  Service: Orthopedics;  Laterality: Right;     SOCIAL HISTORY: Social History   Socioeconomic History  . Marital status: Divorced    Spouse name: Not on file  . Number of children: Not on file  . Years of education: Not on  file  . Highest education level: Not on file  Occupational History  . Not on file  Tobacco Use  . Smoking status: Never Smoker  . Smokeless tobacco: Never Used  Vaping Use  . Vaping Use: Never used  Substance and Sexual Activity  . Alcohol use: No  . Drug use: No  . Sexual activity: Not Currently    Birth control/protection: None  Other Topics Concern  . Not on file  Social History Narrative  . Not on file   Social Determinants of Health   Financial Resource Strain:   . Difficulty of Paying Living Expenses:   Food Insecurity:   . Worried About Charity fundraiser in the Last Year:   . Arboriculturist in the Last Year:   Transportation Needs:   . Film/video editor (Medical):   Marland Kitchen Lack of Transportation (Non-Medical):   Physical Activity:   . Days of Exercise per Week:   . Minutes of Exercise per Session:   Stress:   . Feeling of Stress :   Social Connections:   .  Frequency of Communication with Friends and Family:   . Frequency of Social Gatherings with Friends and Family:   . Attends Religious Services:   . Active Member of Clubs or Organizations:   . Attends Archivist Meetings:   Marland Kitchen Marital Status:   Intimate Partner Violence:   . Fear of Current or Ex-Partner:   . Emotionally Abused:   Marland Kitchen Physically Abused:   . Sexually Abused:      FAMILY HISTORY: Family History  Problem Relation Age of Onset  . Cancer Mother        breast/bilateral mastectomies  . Diabetes Other   . Hypertension Other   . Stroke Other   . Obesity Other      ALLERGIES:   has No Known Allergies.   MEDICATIONS:  Current Outpatient Medications  Medication Sig Dispense Refill  . albuterol (VENTOLIN HFA) 108 (90 Base) MCG/ACT inhaler Inhale 1-2 puffs into the lungs every 6 (six) hours as needed for wheezing or shortness of breath.    Marland Kitchen amoxicillin (AMOXIL) 500 MG tablet Take 2 grams 1 hour prior to dental procedure. (Patient taking differently: Take 2,000 mg by mouth See  admin instructions. Take 2 grams by mouth 1 hour prior to dental procedure.) 16 tablet 0  . Ascorbic Acid (VITAMIN C) 1000 MG tablet Take 1,000 mg by mouth daily.    Marland Kitchen aspirin 81 MG chewable tablet Chew 1 tablet (81 mg total) by mouth 2 (two) times daily. (Patient not taking: Reported on 02/05/2020) 60 tablet 0  . buPROPion (WELLBUTRIN XL) 300 MG 24 hr tablet Take 300 mg by mouth daily.     . Calcium-Vitamin D-Vitamin K (VIACTIV PO) Take 1 tablet by mouth daily.    . Cyanocobalamin (B-12 PO) Take 1 tablet by mouth daily. Patient is unsure of dose    . diclofenac Sodium (VOLTAREN) 1 % GEL Apply 2 g topically daily as needed (pain).    Marland Kitchen docusate sodium (COLACE) 100 MG capsule Take 1 capsule (100 mg total) by mouth 2 (two) times daily. (Patient not taking: Reported on 08/08/2019) 10 capsule 0  . escitalopram (LEXAPRO) 10 MG tablet Take 10 mg by mouth at bedtime.    . Ferrous Sulfate (IRON PO) Take 65 mg by mouth daily.    Marland Kitchen ibuprofen (ADVIL) 800 MG tablet TAKE 1 TABLET BY MOUTH EVERY 8 (EIGHT) HOURS AS NEEDED FOR MODERATE PAIN (MUST TAKE WITH FOOD). 60 tablet 2  . Menthol, Topical Analgesic, (BIOFREEZE EX) Apply 1 application topically daily as needed (pain).    . methocarbamol (ROBAXIN) 500 MG tablet Take 1 tablet (500 mg total) by mouth every 8 (eight) hours as needed for muscle spasms. (Patient not taking: Reported on 08/08/2019) 30 tablet 0  . Multiple Vitamins-Minerals (EMERGEN-C VITAMIN C PO) Take 1 tablet by mouth daily.    . Probiotic Product (PROBIOTIC PO) Take 1 capsule by mouth daily.    . traMADol (ULTRAM) 50 MG tablet Take 1 tablet (50 mg total) by mouth every 6 (six) hours as needed. 60 tablet 0  . traZODone (DESYREL) 50 MG tablet Take 100 mg by mouth at bedtime.     . triamterene-hydrochlorothiazide (MAXZIDE-25) 37.5-25 MG tablet Take 1 tablet by mouth daily.   0  . zinc gluconate 50 MG tablet Take 50 mg by mouth daily.     No current facility-administered medications for this visit.      REVIEW OF SYSTEMS:   A 10+ POINT REVIEW OF SYSTEMS WAS OBTAINED including neurology,  dermatology, psychiatry, cardiac, respiratory, lymph, extremities, GI, GU, Musculoskeletal, constitutional, breasts, reproductive, HEENT.  All pertinent positives are noted in the HPI.  All others are negative.   PHYSICAL EXAMINATION: ECOG PERFORMANCE STATUS: 1 - Symptomatic but completely ambulatory  Telehealth visit   LABORATORY DATA:  I have reviewed the data as listed  CBC Latest Ref Rng & Units 02/05/2020 02/05/2020 07/07/2019  WBC 4.0 - 10.5 K/uL 4.8 - 5.2  Hemoglobin 12.0 - 15.0 g/dL 13.8 - 13.1  Hematocrit 34.0 - 46.6 % 42.8 42.1 41.0  Platelets 150 - 400 K/uL 250 - 207    CMP Latest Ref Rng & Units 02/05/2020 07/07/2019 06/27/2017  Glucose 70 - 99 mg/dL 97 105(H) 106(H)  BUN 6 - 20 mg/dL 15 17 11   Creatinine 0.44 - 1.00 mg/dL 0.87 0.88 0.93  Sodium 135 - 145 mmol/L 139 140 136  Potassium 3.5 - 5.1 mmol/L 4.6 3.7 3.5  Chloride 98 - 111 mmol/L 104 103 100(L)  CO2 22 - 32 mmol/L 24 29 26   Calcium 8.9 - 10.3 mg/dL 9.9 9.0 9.0  Total Protein 6.5 - 8.1 g/dL 7.5 - -  Total Bilirubin 0.3 - 1.2 mg/dL 0.5 - -  Alkaline Phos 38 - 126 U/L 71 - -  AST 15 - 41 U/L 22 - -  ALT 0 - 44 U/L 24 - -      RADIOGRAPHIC STUDIES: I have personally reviewed the radiological images as listed and agreed with the findings in the report. US Abdomen Complete  Result Date: 02/19/2020 CLINICAL DATA:  History of ethanol abuse. EXAM: ABDOMEN ULTRASOUND COMPLETE COMPARISON:  No prior. FINDINGS: Gallbladder: No gallstones or wall thickening visualized. No sonographic Murphy sign noted by sonographer. Common bile duct: Diameter: 3.8 Liver: Increased echogenicity consistent fatty infiltration or hepatocellular disease. Liver is prominent 18.5 cm. 1.5 cm benign-appearing cyst noted the right hepatic lobe. Portal vein is patent on color Doppler imaging with normal direction of blood flow towards the liver. IVC: No  abnormality visualized. Pancreas: Visualized portion unremarkable. Spleen: Size and appearance within normal limits. Spleen measures 13 cm. Right Kidney: Length: 11.9 cm. Echogenicity within normal limits. No mass or hydronephrosis visualized. Left Kidney: Length: 12.6 cm. Echogenicity within normal limits. No mass or hydronephrosis visualized. Abdominal aorta: No aneurysm visualized. Other findings: None. IMPRESSION: 1. Increased hepatic echogenicity consistent fatty infiltration or hepatocellular disease. Liver is prominent 18.5 cm. Benign-appearing 1.5 cm cyst noted right hepatic lobe. Spleen appears unremarkable and measures 13.0 cm. 2.  No acute abnormality.  No gallstones or biliary distention. Electronically Signed   By: Marcello Moores  Register   On: 02/19/2020 09:52     ASSESSMENT & PLAN:  Mary Morrison is a 54 y.o. female with:  1. RBC MAcrocytosis without significant anemia 2. IgA Kappa monoclonal paraproteinemia  -Will get additional labs today   PLAN: -Discussed pt labwork, 02/05/20; no anemia, WBC & PLT are nml, some macrocytosis, blood chemistries are nml; haptoglobin, Retic panel, Folate, Vitamin B1, Vitamin D, Copper, & Vitamin B12 are nml, elevated methylmalonic acid & LDH, M Protein at 0.3 g/dL -Discussed 02/19/2020 [place] Abd ([id number]) which revealed "1. Increased hepatic echogenicity consistent fatty infiltration or hepatocellular disease. Liver is prominent 18.5 cm. Benign-appearing 1.5 cm cyst noted right hepatic lobe. Spleen appears unremarkable and measures 13.0 cm. 2.  No acute abnormality. No gallstones or biliary distention." -Advised pt that it is unlikely that she has a primary bone marrow disorder as most of her blood counts are nml. If counts  begin to drop will reconsider a more aggressive evaluation.  -Normal haptoglobin suggests against overt hemolysis.  -Advised pt that elevated Methylmalonic acid is a sign of functional Vitamin B12 deficiency.  -Pt has some fatty  liver/liver disease on [place] Abd, which could contribute to macrocytosis. -Recommend pt take 1000 mcg Vitamin B12 and an OTC B-complex vitamin daily.  -Recommend pt complete a liver w/o with PCP -Will check thyroid function & K/L light chains today  -Will see back in 4 months, with labs 1 week prior   FOLLOW UP Labs today at 3pm RTC with Dr Irene Limbo with labs in 4 months (plz schedule these labs 1 week ahead of clinic visit)   The total time spent in the appt was 20 minutes and more than 50% was on counseling and direct patient cares.  All of the patient's questions were answered with apparent satisfaction. The patient knows to call the clinic with any problems, questions or concerns.    Sullivan Lone MD [place] AAHIVMS [facility] Hematology/Oncology Physician [facility]  (Office):       [phone] (Work cell):  [phone] (Fax):           [phone]  02/24/2020 9:53 AM  I, Yevette Edwards, am acting as a scribe for Dr. Sullivan Lone.   .I have reviewed the above documentation for accuracy and completeness, and I agree with the above. Brunetta Genera MD

## 2020-02-24 ENCOUNTER — Telehealth: Payer: Self-pay | Admitting: Hematology

## 2020-02-24 ENCOUNTER — Inpatient Hospital Stay: Payer: 59

## 2020-02-24 ENCOUNTER — Inpatient Hospital Stay: Payer: 59 | Attending: Hematology | Admitting: Hematology

## 2020-02-24 ENCOUNTER — Other Ambulatory Visit: Payer: Self-pay

## 2020-02-24 DIAGNOSIS — D472 Monoclonal gammopathy: Secondary | ICD-10-CM | POA: Insufficient documentation

## 2020-02-24 DIAGNOSIS — Z7982 Long term (current) use of aspirin: Secondary | ICD-10-CM | POA: Insufficient documentation

## 2020-02-24 DIAGNOSIS — K769 Liver disease, unspecified: Secondary | ICD-10-CM | POA: Diagnosis not present

## 2020-02-24 DIAGNOSIS — Z79899 Other long term (current) drug therapy: Secondary | ICD-10-CM | POA: Diagnosis not present

## 2020-02-24 DIAGNOSIS — F329 Major depressive disorder, single episode, unspecified: Secondary | ICD-10-CM | POA: Insufficient documentation

## 2020-02-24 DIAGNOSIS — E669 Obesity, unspecified: Secondary | ICD-10-CM | POA: Insufficient documentation

## 2020-02-24 DIAGNOSIS — E538 Deficiency of other specified B group vitamins: Secondary | ICD-10-CM | POA: Diagnosis not present

## 2020-02-24 DIAGNOSIS — D7589 Other specified diseases of blood and blood-forming organs: Secondary | ICD-10-CM | POA: Diagnosis present

## 2020-02-24 DIAGNOSIS — K760 Fatty (change of) liver, not elsewhere classified: Secondary | ICD-10-CM | POA: Diagnosis not present

## 2020-02-24 DIAGNOSIS — D539 Nutritional anemia, unspecified: Secondary | ICD-10-CM

## 2020-02-24 LAB — TSH: TSH: 0.813 u[IU]/mL (ref 0.308–3.960)

## 2020-02-24 LAB — SEDIMENTATION RATE: Sed Rate: 12 mm/hr (ref 0–22)

## 2020-02-24 LAB — T4, FREE: Free T4: 0.81 ng/dL (ref 0.61–1.12)

## 2020-02-24 NOTE — Telephone Encounter (Signed)
Scheduled per 08/03 los, patient has been called and notified. 

## 2020-02-25 LAB — BETA 2 MICROGLOBULIN, SERUM: Beta-2 Microglobulin: 1.4 mg/L (ref 0.6–2.4)

## 2020-02-25 LAB — KAPPA/LAMBDA LIGHT CHAINS
Kappa free light chain: 18.2 mg/L (ref 3.3–19.4)
Kappa, lambda light chain ratio: 1.92 — ABNORMAL HIGH (ref 0.26–1.65)
Lambda free light chains: 9.5 mg/L (ref 5.7–26.3)

## 2020-03-03 ENCOUNTER — Telehealth: Payer: Self-pay

## 2020-03-03 NOTE — Telephone Encounter (Signed)
Received call from patient regarding lab results. Advised pt per Dr. Irene Limbo additional labs done to evaluate thyroid function and abnormal protein are unrevealing -- clinic and lab f/u as per plan. She verbalized understanding.

## 2020-03-05 ENCOUNTER — Encounter: Payer: Self-pay | Admitting: Genetic Counselor

## 2020-04-30 DIAGNOSIS — Z20822 Contact with and (suspected) exposure to covid-19: Secondary | ICD-10-CM | POA: Diagnosis not present

## 2020-05-10 DIAGNOSIS — Z1152 Encounter for screening for COVID-19: Secondary | ICD-10-CM | POA: Diagnosis not present

## 2020-06-04 DIAGNOSIS — K219 Gastro-esophageal reflux disease without esophagitis: Secondary | ICD-10-CM | POA: Diagnosis not present

## 2020-06-04 DIAGNOSIS — Z9884 Bariatric surgery status: Secondary | ICD-10-CM | POA: Diagnosis not present

## 2020-06-04 DIAGNOSIS — M25511 Pain in right shoulder: Secondary | ICD-10-CM | POA: Diagnosis not present

## 2020-06-11 DIAGNOSIS — M7581 Other shoulder lesions, right shoulder: Secondary | ICD-10-CM | POA: Diagnosis not present

## 2020-06-11 DIAGNOSIS — M25521 Pain in right elbow: Secondary | ICD-10-CM | POA: Diagnosis not present

## 2020-06-22 ENCOUNTER — Other Ambulatory Visit: Payer: Self-pay

## 2020-06-22 ENCOUNTER — Inpatient Hospital Stay: Payer: BC Managed Care – PPO | Attending: Hematology

## 2020-06-22 DIAGNOSIS — K760 Fatty (change of) liver, not elsewhere classified: Secondary | ICD-10-CM | POA: Insufficient documentation

## 2020-06-22 DIAGNOSIS — K769 Liver disease, unspecified: Secondary | ICD-10-CM

## 2020-06-22 DIAGNOSIS — D7589 Other specified diseases of blood and blood-forming organs: Secondary | ICD-10-CM | POA: Insufficient documentation

## 2020-06-22 DIAGNOSIS — E538 Deficiency of other specified B group vitamins: Secondary | ICD-10-CM

## 2020-06-22 DIAGNOSIS — D539 Nutritional anemia, unspecified: Secondary | ICD-10-CM

## 2020-06-22 DIAGNOSIS — R7989 Other specified abnormal findings of blood chemistry: Secondary | ICD-10-CM | POA: Insufficient documentation

## 2020-06-22 DIAGNOSIS — D472 Monoclonal gammopathy: Secondary | ICD-10-CM

## 2020-06-22 LAB — CBC WITH DIFFERENTIAL/PLATELET
Abs Immature Granulocytes: 0.01 10*3/uL (ref 0.00–0.07)
Basophils Absolute: 0 10*3/uL (ref 0.0–0.1)
Basophils Relative: 1 %
Eosinophils Absolute: 0.1 10*3/uL (ref 0.0–0.5)
Eosinophils Relative: 1 %
HCT: 41.9 % (ref 36.0–46.0)
Hemoglobin: 13.5 g/dL (ref 12.0–15.0)
Immature Granulocytes: 0 %
Lymphocytes Relative: 23 %
Lymphs Abs: 1 10*3/uL (ref 0.7–4.0)
MCH: 34.3 pg — ABNORMAL HIGH (ref 26.0–34.0)
MCHC: 32.2 g/dL (ref 30.0–36.0)
MCV: 106.3 fL — ABNORMAL HIGH (ref 80.0–100.0)
Monocytes Absolute: 0.5 10*3/uL (ref 0.1–1.0)
Monocytes Relative: 11 %
Neutro Abs: 2.9 10*3/uL (ref 1.7–7.7)
Neutrophils Relative %: 64 %
Platelets: 212 10*3/uL (ref 150–400)
RBC: 3.94 MIL/uL (ref 3.87–5.11)
RDW: 12.9 % (ref 11.5–15.5)
WBC: 4.5 10*3/uL (ref 4.0–10.5)
nRBC: 0 % (ref 0.0–0.2)

## 2020-06-22 LAB — VITAMIN B12: Vitamin B-12: 362 pg/mL (ref 180–914)

## 2020-06-22 LAB — CMP (CANCER CENTER ONLY)
ALT: 21 U/L (ref 0–44)
AST: 14 U/L — ABNORMAL LOW (ref 15–41)
Albumin: 3.8 g/dL (ref 3.5–5.0)
Alkaline Phosphatase: 64 U/L (ref 38–126)
Anion gap: 7 (ref 5–15)
BUN: 12 mg/dL (ref 6–20)
CO2: 30 mmol/L (ref 22–32)
Calcium: 9.3 mg/dL (ref 8.9–10.3)
Chloride: 105 mmol/L (ref 98–111)
Creatinine: 0.84 mg/dL (ref 0.44–1.00)
GFR, Estimated: >60 mL/min (ref >60)
Glucose, Bld: 108 mg/dL — ABNORMAL HIGH (ref 70–99)
Potassium: 4.2 mmol/L (ref 3.5–5.1)
Sodium: 142 mmol/L (ref 135–145)
Total Bilirubin: 0.6 mg/dL (ref 0.3–1.2)
Total Protein: 6.6 g/dL (ref 6.5–8.1)

## 2020-06-23 LAB — KAPPA/LAMBDA LIGHT CHAINS
Kappa free light chain: 17.1 mg/L (ref 3.3–19.4)
Kappa, lambda light chain ratio: 2.04 — ABNORMAL HIGH (ref 0.26–1.65)
Lambda free light chains: 8.4 mg/L (ref 5.7–26.3)

## 2020-06-24 LAB — MULTIPLE MYELOMA PANEL, SERUM
Albumin SerPl Elph-Mcnc: 3.6 g/dL (ref 2.9–4.4)
Albumin/Glob SerPl: 1.6 (ref 0.7–1.7)
Alpha 1: 0.2 g/dL (ref 0.0–0.4)
Alpha2 Glob SerPl Elph-Mcnc: 0.6 g/dL (ref 0.4–1.0)
B-Globulin SerPl Elph-Mcnc: 0.9 g/dL (ref 0.7–1.3)
Gamma Glob SerPl Elph-Mcnc: 0.7 g/dL (ref 0.4–1.8)
Globulin, Total: 2.4 g/dL (ref 2.2–3.9)
IgA: 341 mg/dL (ref 87–352)
IgG (Immunoglobin G), Serum: 521 mg/dL — ABNORMAL LOW (ref 586–1602)
IgM (Immunoglobulin M), Srm: 83 mg/dL (ref 26–217)
M Protein SerPl Elph-Mcnc: 0.2 g/dL — ABNORMAL HIGH
Total Protein ELP: 6 g/dL (ref 6.0–8.5)

## 2020-06-29 ENCOUNTER — Inpatient Hospital Stay: Payer: BC Managed Care – PPO | Attending: Hematology | Admitting: Hematology

## 2020-06-29 ENCOUNTER — Other Ambulatory Visit: Payer: Self-pay

## 2020-06-29 VITALS — BP 136/86 | HR 82 | Temp 97.2°F | Resp 18 | Ht 66.0 in | Wt 270.9 lb

## 2020-06-29 DIAGNOSIS — D539 Nutritional anemia, unspecified: Secondary | ICD-10-CM | POA: Diagnosis not present

## 2020-06-29 DIAGNOSIS — R059 Cough, unspecified: Secondary | ICD-10-CM | POA: Diagnosis not present

## 2020-06-29 DIAGNOSIS — R0981 Nasal congestion: Secondary | ICD-10-CM | POA: Diagnosis not present

## 2020-06-29 DIAGNOSIS — E538 Deficiency of other specified B group vitamins: Secondary | ICD-10-CM | POA: Diagnosis not present

## 2020-06-29 DIAGNOSIS — D7589 Other specified diseases of blood and blood-forming organs: Secondary | ICD-10-CM | POA: Diagnosis not present

## 2020-06-29 DIAGNOSIS — D472 Monoclonal gammopathy: Secondary | ICD-10-CM | POA: Insufficient documentation

## 2020-06-29 MED ORDER — AZITHROMYCIN 250 MG PO TABS: ORAL_TABLET | ORAL | 0 refills | Status: DC

## 2020-06-29 NOTE — Progress Notes (Signed)
HEMATOLOGY/ONCOLOGY CONSULTATION NOTE  Date of Service: 06/29/2020  Patient Care Team: Harlan Stains, MD as PCP - General (Family Medicine) Kennith Center, RD as Dietitian (Family Medicine)  REFERRING PHYSICIAN: Harlan Stains, MD  CHIEF COMPLAINTS/PURPOSE OF CONSULTATION:  Macrocytosis   HISTORY OF PRESENTING ILLNESS:  Mary Morrison is a wonderful 54 y.o. female who has been referred to [place] by Harlan Stains, MD for evaluation and management of macrocytosis. The pt reports that she is doing well overall.   The pt reports she is good. Pt has been taking Vitamin B12 supplement and iron supplement. She has been hurting all over and hardly able to walk in the morning. It has been specifically her right shoulder to arm numbness and weakness. She has wound healing issues and wounds take longer to heal. Pt has chronic pedal edema. Pt previously had lap band surgery and she has not restrictions. The fluid has been taken out.   She is not on acid suppressants. 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 She has not had thyroid issues and knows of no known thyroid issues within the family. Pt has been generically tested and has no mutations. She is keeping up with her age appropriate cancer screenings. Pt has not had liver problems like fatty liver or enzyme abnormalities. She has not gotten an abdominal CT in the last few years.   Most recent lab results (01/16/20) of CBC is as follows: all values are WNL except for RBC at 3.91, MCV at 103.7, MCH at 34.6, Lymph# at 1, Glucose at 115, GFR (Non-African-American) at 55, Sodium at 147, CO2 at 33  01/16/20 of Vitamin B12 at 255: WNL 65/99/35 of Folate (Folic Acid), Serum at 8.9: WNL  On review of systems, pt reports pedal edema, fatigue, general pain/ache, night sweats, hot flashes and denies bleeding issues, unexpected change in weight, blood/black stool, gum bleeds, nose bleeds, fevers, chills and any other symptoms.   On PMHx the pt reports 2  knee replacements, bariatric surgery (lap band), chronic pedal edema   On Social Hx the pt reports 20 years sober, no cigarette use   On Family Hx the pt reports mother breast cancer, and vaginal cancer, maternal grandmother myocardial infarction, maternal grandfather CVA, no blood disorders   INTERVAL HISTORY:  Mary Morrison is a wonderful 54 y.o. female who is here for evaluation and management of macrocytosis. The patient's last visit with [place] was on 02/24/2020. The pt reports that she is doing well overall.  The pt reports that after traveling to see her niece on Thanksgiving she began to experience nasal congestion and a dry cough. Pt notes that her phlegm is clear at this time. She has taken Mucinex and Zicam which has not improved her symptoms. She has recently tested negative for COVID19. Pt has received her COVID19 and annual flu vaccines. Pt continues taking a Vitamin B12 and B-complex daily. Pt is scheduled to have labwork with her PCP tomorrow. She believes that this is a part of the work up for her liver.  Lab results (06/22/20) of CBC w/diff and CMP is as follows: all values are WNL except for MCV at 106.3, MCH at 34.3, Glucose at 108, AST at 14. 06/22/2020 Kappa free light chain at 17.1, Lamda free light chains at 8.4, K/L light chain ratio at 2.04 06/22/2020 MMP shows all values are WNL except for IgG at 521, M Protein at 0.2. 06/22/2020 Vitamin B12 at 362  On review of systems,  pt reports hot flashes, cough, nasal congestion and denies fevers, chills, night sweats, new bone pain and any other symptoms.   MEDICAL HISTORY:  Past Medical History:  Diagnosis Date  . Arthritis   . Asthma    EXERCISED INDUCED 2014   . Depression   . Fibroids   . Obesity   . Seasonal allergies   . Varicose vein of leg      SURGICAL HISTORY: Past Surgical History:  Procedure Laterality Date  . BILATERAL SALPINGECTOMY Bilateral 09/21/2014   Procedure: BILATERAL SALPINGECTOMY;  Surgeon:  Marvene Staff, MD;  Location: [facility];  Service: Gynecology;  Laterality: Bilateral;  . CESAREAN SECTION  1989  . COLONOSCOPY    . LAPAROSCOPIC GASTRIC BANDING N/A 02/17/2014   Procedure: LAPAROSCOPIC GASTRIC BANDING;  Surgeon: Pedro Earls, MD;  Location: [facility];  Service: General;  Laterality: N/A;  . ROBOT ASSISTED MYOMECTOMY N/A 09/21/2014   Procedure: ROBOTIC ASSISTED MYOMECTOMY;  Surgeon: Marvene Staff, MD;  Location: [facility];  Service: Gynecology;  Laterality: N/A;  . ROBOTIC ASSISTED TOTAL HYSTERECTOMY N/A 09/21/2014   Procedure: ROBOTIC ASSISTED TOTAL HYSTERECTOMY, EXCISION OF PELVIC ENDOMETRIOSIS;  Surgeon: Marvene Staff, MD;  Location: [facility];  Service: Gynecology;  Laterality: N/A;  . TONSILLECTOMY    . TOTAL KNEE ARTHROPLASTY Left 07/03/2017   Procedure: LEFT TOTAL KNEE ARTHROPLASTY;  Surgeon: Meredith Pel, MD;  Location: [facility];  Service: Orthopedics;  Laterality: Left;  . TOTAL KNEE ARTHROPLASTY Right 07/10/2019   Procedure: RIGHT TOTAL KNEE ARTHROPLASTY;  Surgeon: Meredith Pel, MD;  Location: [place] [facility];  Service: Orthopedics;  Laterality: Right;     SOCIAL HISTORY: Social History   Socioeconomic History  . Marital status: Divorced    Spouse name: Not on file  . Number of children: Not on file  . Years of education: Not on file  . Highest education level: Not on file  Occupational History  . Not on file  Tobacco Use  . Smoking status: Never Smoker  . Smokeless tobacco: Never Used  Vaping Use  . Vaping Use: Never used  Substance and Sexual Activity  . Alcohol use: No  . Drug use: No  . Sexual activity: Not Currently    Birth control/protection: None  Other Topics Concern  . Not on file  Social History Narrative  . Not on file   Social Determinants of Health   Financial Resource Strain:   . Difficulty of Paying Living Expenses: Not on file  Food Insecurity:   . Worried About Charity fundraiser in the Last Year: Not on file  .  Ran Out of Food in the Last Year: Not on file  Transportation Needs:   . Lack of Transportation (Medical): Not on file  . Lack of Transportation (Non-Medical): Not on file  Physical Activity:   . Days of Exercise per Week: Not on file  . Minutes of Exercise per Session: Not on file  Stress:   . Feeling of Stress : Not on file  Social Connections:   . Frequency of Communication with Friends and Family: Not on file  . Frequency of Social Gatherings with Friends and Family: Not on file  . Attends Religious Services: Not on file  . Active Member of Clubs or Organizations: Not on file  . Attends Archivist Meetings: Not on file  . Marital Status: Not on file  Intimate Partner Violence:   . Fear of Current or Ex-Partner: Not on file  .  Emotionally Abused: Not on file  . Physically Abused: Not on file  . Sexually Abused: Not on file     FAMILY HISTORY: Family History  Problem Relation Age of Onset  . Cancer Mother        breast/bilateral mastectomies  . Diabetes Other   . Hypertension Other   . Stroke Other   . Obesity Other      ALLERGIES:   has No Known Allergies.   MEDICATIONS:  Current Outpatient Medications  Medication Sig Dispense Refill  . albuterol (VENTOLIN HFA) 108 (90 Base) MCG/ACT inhaler Inhale 1-2 puffs into the lungs every 6 (six) hours as needed for wheezing or shortness of breath.    Marland Kitchen amoxicillin (AMOXIL) 500 MG tablet Take 2 grams 1 hour prior to dental procedure. (Patient taking differently: Take 2,000 mg by mouth See admin instructions. Take 2 grams by mouth 1 hour prior to dental procedure.) 16 tablet 0  . Ascorbic Acid (VITAMIN C) 1000 MG tablet Take 1,000 mg by mouth daily.    Marland Kitchen aspirin 81 MG chewable tablet Chew 1 tablet (81 mg total) by mouth 2 (two) times daily. (Patient not taking: Reported on 02/05/2020) 60 tablet 0  . buPROPion (WELLBUTRIN XL) 300 MG 24 hr tablet Take 300 mg by mouth daily.     . Calcium-Vitamin D-Vitamin K (VIACTIV PO)  Take 1 tablet by mouth daily.    . Cyanocobalamin (B-12 PO) Take 1 tablet by mouth daily. Patient is unsure of dose    . diclofenac Sodium (VOLTAREN) 1 % GEL Apply 2 g topically daily as needed (pain).    Marland Kitchen docusate sodium (COLACE) 100 MG capsule Take 1 capsule (100 mg total) by mouth 2 (two) times daily. (Patient not taking: Reported on 08/08/2019) 10 capsule 0  . escitalopram (LEXAPRO) 10 MG tablet Take 10 mg by mouth at bedtime.    . Ferrous Sulfate (IRON PO) Take 65 mg by mouth daily.    Marland Kitchen ibuprofen (ADVIL) 800 MG tablet TAKE 1 TABLET BY MOUTH EVERY 8 (EIGHT) HOURS AS NEEDED FOR MODERATE PAIN (MUST TAKE WITH FOOD). 60 tablet 2  . Menthol, Topical Analgesic, (BIOFREEZE EX) Apply 1 application topically daily as needed (pain).    . methocarbamol (ROBAXIN) 500 MG tablet Take 1 tablet (500 mg total) by mouth every 8 (eight) hours as needed for muscle spasms. (Patient not taking: Reported on 08/08/2019) 30 tablet 0  . Multiple Vitamins-Minerals (EMERGEN-C VITAMIN C PO) Take 1 tablet by mouth daily.    . Probiotic Product (PROBIOTIC PO) Take 1 capsule by mouth daily.    . traMADol (ULTRAM) 50 MG tablet Take 1 tablet (50 mg total) by mouth every 6 (six) hours as needed. 60 tablet 0  . traZODone (DESYREL) 50 MG tablet Take 100 mg by mouth at bedtime.     . triamterene-hydrochlorothiazide (MAXZIDE-25) 37.5-25 MG tablet Take 1 tablet by mouth daily.   0  . zinc gluconate 50 MG tablet Take 50 mg by mouth daily.     No current facility-administered medications for this visit.     REVIEW OF SYSTEMS:   A 10+ POINT REVIEW OF SYSTEMS WAS OBTAINED including neurology, dermatology, psychiatry, cardiac, respiratory, lymph, extremities, GI, GU, Musculoskeletal, constitutional, breasts, reproductive, HEENT.  All pertinent positives are noted in the HPI.  All others are negative.   PHYSICAL EXAMINATION: ECOG PERFORMANCE STATUS: 1 - Symptomatic but completely ambulatory   GENERAL:alert, in no acute distress and  comfortable SKIN: no acute rashes, no significant lesions  EYES: conjunctiva are pink and non-injected, sclera anicteric OROPHARYNX: MMM, no exudates, no oropharyngeal erythema or ulceration NECK: supple, no JVD LYMPH:  no palpable lymphadenopathy in the cervical, axillary or inguinal regions LUNGS: clear to auscultation b/l with normal respiratory effort HEART: regular rate & rhythm ABDOMEN:  normoactive bowel sounds , non tender, not distended. No palpable hepatosplenomegaly.  Extremity: no pedal edema PSYCH: alert & oriented x 3 with fluent speech NEURO: no focal motor/sensory deficits  LABORATORY DATA:  I have reviewed the data as listed  CBC Latest Ref Rng & Units 06/22/2020 02/05/2020 02/05/2020  WBC 4.0 - 10.5 K/uL 4.5 4.8 -  Hemoglobin 12.0 - 15.0 g/dL 13.5 13.8 -  Hematocrit 36 - 46 % 41.9 42.8 42.1  Platelets 150 - 400 K/uL 212 250 -    CMP Latest Ref Rng & Units 06/22/2020 02/05/2020 07/07/2019  Glucose 70 - 99 mg/dL 108(H) 97 105(H)  BUN 6 - 20 mg/dL 12 15 17   Creatinine 0.44 - 1.00 mg/dL 0.84 0.87 0.88  Sodium 135 - 145 mmol/L 142 139 140  Potassium 3.5 - 5.1 mmol/L 4.2 4.6 3.7  Chloride 98 - 111 mmol/L 105 104 103  CO2 22 - 32 mmol/L 30 24 29   Calcium 8.9 - 10.3 mg/dL 9.3 9.9 9.0  Total Protein 6.5 - 8.1 g/dL 6.6 7.5 -  Total Bilirubin 0.3 - 1.2 mg/dL 0.6 0.5 -  Alkaline Phos 38 - 126 U/L 64 71 -  AST 15 - 41 U/L 14(L) 22 -  ALT 0 - 44 U/L 21 24 -      RADIOGRAPHIC STUDIES: I have personally reviewed the radiological images as listed and agreed with the findings in the report. No results found.   ASSESSMENT & PLAN:   Mary Morrison is a 54 y.o. female with:  1. RBC MAcrocytosis without significant anemia 2. IgA Kappa monoclonal paraproteinemia  -Will get additional labs today   PLAN: -Discussed pt labwork, 06/22/20; blood counts and chemistries look good, B12 is okay, K/L light chain ratio & M Protein is stable. -Advised pt that if her  macrocytosis were caused by a primary bone marrow problem we would expect to see a more significant change in blood counts and M Protein.  -Recommend f/u with PCP as scheduled to complete a liver w/o. -Continue 1000 mcg Vitamin B12 and B-complex daily. -Recommend warm steam for congestion.  -Will see back in 4 months with labs  -Rx Z-pak   FOLLOW UP: RTC with Dr Irene Limbo with labs in 4 months   The total time spent in the appt was 20 minutes and more than 50% was on counseling and direct patient cares.  All of the patient's questions were answered with apparent satisfaction. The patient knows to call the clinic with any problems, questions or concerns.    Sullivan Lone MD [place] AAHIVMS [facility] Hematology/Oncology Physician [facility]  (Office):       [phone] (Work cell):  [phone] (Fax):           [phone]  06/29/2020 3:01 PM  I, Yevette Edwards, am acting as a scribe for Dr. Sullivan Lone.   .I have reviewed the above documentation for accuracy and completeness, and I agree with the above. Brunetta Genera MD

## 2020-06-30 DIAGNOSIS — G4700 Insomnia, unspecified: Secondary | ICD-10-CM | POA: Diagnosis not present

## 2020-06-30 DIAGNOSIS — J069 Acute upper respiratory infection, unspecified: Secondary | ICD-10-CM | POA: Diagnosis not present

## 2020-06-30 DIAGNOSIS — I10 Essential (primary) hypertension: Secondary | ICD-10-CM | POA: Diagnosis not present

## 2020-06-30 DIAGNOSIS — F3341 Major depressive disorder, recurrent, in partial remission: Secondary | ICD-10-CM | POA: Diagnosis not present

## 2020-07-05 ENCOUNTER — Ambulatory Visit (INDEPENDENT_AMBULATORY_CARE_PROVIDER_SITE_OTHER): Payer: BC Managed Care – PPO | Admitting: Rehabilitative and Restorative Service Providers"

## 2020-07-05 ENCOUNTER — Other Ambulatory Visit: Payer: Self-pay

## 2020-07-05 ENCOUNTER — Encounter: Payer: Self-pay | Admitting: Rehabilitative and Restorative Service Providers"

## 2020-07-05 DIAGNOSIS — M25511 Pain in right shoulder: Secondary | ICD-10-CM | POA: Diagnosis not present

## 2020-07-05 DIAGNOSIS — G8929 Other chronic pain: Secondary | ICD-10-CM | POA: Diagnosis not present

## 2020-07-05 DIAGNOSIS — R293 Abnormal posture: Secondary | ICD-10-CM

## 2020-07-05 DIAGNOSIS — M6281 Muscle weakness (generalized): Secondary | ICD-10-CM | POA: Diagnosis not present

## 2020-07-05 NOTE — Therapy (Signed)
[facility] Physical Therapy [address] [place], [place], [address] Phone: [phone]   Fax:  [phone]  Physical Therapy Evaluation  Patient Details  Name: Mary Morrison MRN: [id number] Date of Birth: 31-Dec-1965 Referring Provider (PT): Inez Catalina MD   Encounter Date: 07/05/2020   PT End of Session - 07/05/20 1606    Visit Number 0    Number of Visits 12    Date for PT Re-Evaluation 08/30/20    Progress Note Due on Visit 10    PT Start Time 1606    PT Stop Time 1645    PT Time Calculation (min) 39 min    Activity Tolerance Patient tolerated treatment well    Behavior During Therapy [facility] for tasks assessed/performed           Past Medical History:  Diagnosis Date  . Arthritis   . Asthma    EXERCISED INDUCED 2014   . Depression   . Fibroids   . Obesity   . Seasonal allergies   . Varicose vein of leg     Past Surgical History:  Procedure Laterality Date  . BILATERAL SALPINGECTOMY Bilateral 09/21/2014   Procedure: BILATERAL SALPINGECTOMY;  Surgeon: Marvene Staff, MD;  Location: [facility];  Service: Gynecology;  Laterality: Bilateral;  . CESAREAN SECTION  1989  . COLONOSCOPY    . LAPAROSCOPIC GASTRIC BANDING N/A 02/17/2014   Procedure: LAPAROSCOPIC GASTRIC BANDING;  Surgeon: Pedro Earls, MD;  Location: [facility];  Service: General;  Laterality: N/A;  . ROBOT ASSISTED MYOMECTOMY N/A 09/21/2014   Procedure: ROBOTIC ASSISTED MYOMECTOMY;  Surgeon: Marvene Staff, MD;  Location: [facility];  Service: Gynecology;  Laterality: N/A;  . ROBOTIC ASSISTED TOTAL HYSTERECTOMY N/A 09/21/2014   Procedure: ROBOTIC ASSISTED TOTAL HYSTERECTOMY, EXCISION OF PELVIC ENDOMETRIOSIS;  Surgeon: Marvene Staff, MD;  Location: [facility];  Service: Gynecology;  Laterality: N/A;  . TONSILLECTOMY    . TOTAL KNEE ARTHROPLASTY Left 07/03/2017   Procedure: LEFT TOTAL KNEE ARTHROPLASTY;  Surgeon: Meredith Pel, MD;  Location: [place];  Service:  Orthopedics;  Laterality: Left;  . TOTAL KNEE ARTHROPLASTY Right 07/10/2019   Procedure: RIGHT TOTAL KNEE ARTHROPLASTY;  Surgeon: Meredith Pel, MD;  Location: [place];  Service: Orthopedics;  Laterality: Right;    There were no vitals filed for this visit.    Subjective Assessment - 07/05/20 1613    Subjective Complaints of Rt shoulder started in July or so after she fell while working outside.  She noticed it started to hurt more over time c reaching up and over with catching noted as well.  Pt. stated elbow can ache and hand is numb at times with some grasping trouble.  Difficulty lying on Rt side as well.  Saw Dr. Sheppard Coil and performed MSUS showing some inflammation/swelling.  Injection was performed in Nov.    Limitations House hold activities;Lifting    Patient Stated Goals Reduce pain    Currently in Pain? Yes    Pain Score 5    pain at worst 7/10   Pain Location Shoulder    Pain Orientation Right    Pain Descriptors / Indicators Aching    Pain Type Chronic pain    Pain Onset More than a month ago    Pain Frequency Intermittent    Aggravating Factors  reaching overhead, lifting, carrying, sleep    Pain Relieving Factors rest    Effect of Pain on Daily Activities Rt arm use during the day  [facility] PT Assessment - 07/05/20 0001      Assessment   Medical Diagnosis Rt shoulder pain    Referring Provider (PT) Inez Catalina MD    Onset Date/Surgical Date 01/22/20    Hand Dominance Right      Precautions   Precautions None      Restrictions   Weight Bearing Restrictions No      Balance Screen   Has the patient fallen in the past 6 months Yes    How many times? 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  IR mobility deficits c end range tightness noted    AROM Assessment Site Shoulder    Right/Left Shoulder Left;Right    Right Shoulder Internal Rotation 60 Degrees      PROM   PROM Assessment Site Shoulder    Right/Left Shoulder Left;Right      Strength   Strength Assessment Site Shoulder;Elbow    Right/Left Shoulder Left;Right    Right Shoulder Flexion 4+/5    Right Shoulder Extension 4+/5    Right Shoulder ABduction 4+/5    Right Shoulder Internal Rotation 5/5    Right Shoulder External Rotation 5/5    Left Shoulder Flexion 5/5    Left Shoulder ABduction 5/5    Left Shoulder Internal Rotation 5/5    Left Shoulder External Rotation 5/5    Right/Left Elbow Left;Right    Right Elbow Flexion 5/5    Right Elbow Extension 5/5    Left Elbow Flexion 5/5    Left Elbow Extension 5/5      Palpation   Palpation comment TrP c concordant symptoms Rt infraspinatus.  Upper trap TrP noted as well on Rt side      Special Tests   Other special tests (-) Rt empty can, lift off, drop arm.  (+) Michel Bickers                      Objective measurements completed on examination: See above findings.       [facility] Adult PT Treatment/Exercise - 07/05/20 0001      Exercises   Exercises Other Exercises;Shoulder    Other Exercises  HEP instruction/performance trial c handout consisting of upper trap stretch, cross arm stretch, scap retraction c bilateral UE, tband  rows, gh ext      Manual Therapy   Manual therapy comments compression to Rt infraspinatus            Trigger Point Dry Needling - 07/05/20 0001    Consent Given? Yes    Education Handout Provided No  Muscles Treated Upper Quadrant Infraspinatus   Rt   Infraspinatus Response Twitch response elicited                PT Education - 07/05/20 1650    Education Details HEP, POC, DN review    Person(s) Educated Patient    Methods Explanation;Verbal cues;Handout;Other (comment);Demonstration    Comprehension Verbalized understanding;Returned demonstration            PT Short Term Goals - 07/05/20 1606      PT SHORT TERM GOAL #1   Title Patient will demonstrate independent use of home exercise program to maintain progress from in clinic treatments.    Time 3    Period Weeks    Target Date 07/26/20             PT Long Term Goals - 07/05/20 1648      PT LONG TERM GOAL #1   Title Patient will demonstrate/report pain at worst less than or equal to 2/10 to facilitate minimal limitation in daily activity secondary to pain symptoms.    Time 8    Period Weeks    Status New    Target Date 08/30/20      PT LONG TERM GOAL #2   Title Patient will demonstrate independent use of home exercise program to facilitate ability to maintain/progress functional gains from skilled physical therapy services.    Time 8    Period Weeks    Status New    Target Date 08/30/20      PT LONG TERM GOAL #3   Title Patient will demonstrate return to work/recreational activity at previous level of function without limitations secondary due to condition    Time 8    Period Weeks    Status New    Target Date 08/30/20      PT LONG TERM GOAL #4   Title Patient will demonstrate Rt [place] joint mobility WFL to facilitate usual self care, dressing, reaching overhead at PLOF s limitation due to symptoms.    Time 8    Period Weeks    Status New    Target Date 08/30/20      PT LONG TERM GOAL #5    Title Patient will demonstrate Rt UE MMT 5/5 throughout to facilitate usual lifting, carrying in functional activity to PLOF s limitation.    Time 8    Period Weeks    Status New    Target Date 08/30/20                  Plan - 07/05/20 1640    Clinical Impression Statement Patient is a 54 y.o. female who comes to clinic with complaints of Rt shoulder pain with mobility, strength deficits that impair their ability to perform usual daily and recreational functional activities without increase difficulty/symptoms at this time.  Patient to benefit from skilled PT services to address impairments and limitations to improve to previous level of function without restriction secondary to condition.    Personal Factors and Comorbidities Other   arthritis, exercise induced asthma, depression, obesity   Examination-Activity Limitations Carry;Dressing;Hygiene/Grooming;Lift;Reach Overhead    Examination-Participation Restrictions Yard Work;Cleaning;Laundry    Stability/Clinical Decision Making Stable/Uncomplicated    Clinical Decision Making Low    Rehab Potential Good    PT Frequency --   1-2x/week   PT Duration 8 weeks    PT Treatment/Interventions ADLs/Self Care Home Management;Cryotherapy;Electrical Stimulation;Iontophoresis 4mg /ml Dexamethasone;Moist Heat;Balance training;Traction;Therapeutic exercise;Therapeutic activities;Functional mobility training;Stair training;Gait training;Ultrasound;Neuromuscular re-education;Patient/family education;Manual techniques;Taping;Passive range  of motion;Spinal Manipulations;Joint Manipulations;Dry needling    PT Next Visit Plan Reassess DN response, improve strength control.    PT Home Exercise Plan [facility]    Consulted and Agree with Plan of Care Patient           Patient will benefit from skilled therapeutic intervention in order to improve the following deficits and impairments:  Decreased endurance,Decreased activity tolerance,Decreased  strength,Impaired UE functional use,Increased fascial restricitons,Increased muscle spasms,Pain,Decreased mobility,Decreased range of motion,Improper body mechanics,Postural dysfunction,Impaired flexibility  Visit Diagnosis: Chronic right shoulder pain  Muscle weakness (generalized)  Abnormal posture     Problem List Patient Active Problem List   Diagnosis Date Noted  . Arthritis of right knee 07/10/2019  . Arthritis of knee 07/03/2017  . Varicose veins of bilateral lower extremities with other complications 37/10/8887  . Acute pain of left knee 01/25/2017  . Swelling of left knee joint 01/25/2017  . S/P hysterectomy 09/21/2014  . Lapband APS July 2015 02/17/2014  . Edema-chronic pitting in legs 01/20/2014  . Morbid obesity ([place]) 08/28/2013    Scot Jun, PT, DPT, OCS, ATC 07/05/20  4:56 PM    [facility] Physical Therapy [address] [place], [place], [address] Phone: [phone]   Fax:  [phone]  Name: KAYTELYN GLORE MRN: [id number] Date of Birth: 04-25-1966

## 2020-07-05 NOTE — Patient Instructions (Signed)
Access Code: [facility] URL: [email or web address] Date: 07/05/2020 Prepared by: Scot Jun  Exercises Shoulder External Rotation and Scapular Retraction with Resistance - 2 x daily - 7 x weekly - 3 sets - 10 reps Standing Shoulder Posterior Capsule Stretch - 2 x daily - 7 x weekly - 1 sets - 5 reps - 15 hold Standing Shoulder Row with Anchored Resistance - 2 x daily - 7 x weekly - 3 sets - 10 reps Shoulder Extension with Resistance - 2 x daily - 7 x weekly - 3 sets - 10 reps Seated Upper Trapezius Stretch - 2 x daily - 7 x weekly - 1 sets - 5 reps - 15 hold

## 2020-07-06 NOTE — Addendum Note (Signed)
Addended by: Scot Jun B on: 07/06/2020 08:35 AM   Modules accepted: Orders

## 2020-07-07 ENCOUNTER — Ambulatory Visit: Payer: BC Managed Care – PPO | Admitting: Orthopedic Surgery

## 2020-07-09 DIAGNOSIS — Z20822 Contact with and (suspected) exposure to covid-19: Secondary | ICD-10-CM | POA: Diagnosis not present

## 2020-07-09 DIAGNOSIS — Z20828 Contact with and (suspected) exposure to other viral communicable diseases: Secondary | ICD-10-CM | POA: Diagnosis not present

## 2020-07-15 ENCOUNTER — Telehealth: Payer: Self-pay | Admitting: Rehabilitative and Restorative Service Providers"

## 2020-07-15 ENCOUNTER — Encounter: Payer: BC Managed Care – PPO | Admitting: Rehabilitative and Restorative Service Providers"

## 2020-07-15 NOTE — Telephone Encounter (Signed)
Called and left message about missed appointment and reminder about next appointment time.  Scot Jun, PT, DPT, OCS, ATC 07/15/20  1:33 PM

## 2020-07-21 ENCOUNTER — Ambulatory Visit (INDEPENDENT_AMBULATORY_CARE_PROVIDER_SITE_OTHER): Payer: BC Managed Care – PPO | Admitting: Rehabilitative and Restorative Service Providers"

## 2020-07-21 ENCOUNTER — Other Ambulatory Visit: Payer: Self-pay

## 2020-07-21 ENCOUNTER — Encounter: Payer: Self-pay | Admitting: Rehabilitative and Restorative Service Providers"

## 2020-07-21 DIAGNOSIS — R293 Abnormal posture: Secondary | ICD-10-CM | POA: Diagnosis not present

## 2020-07-21 DIAGNOSIS — M25511 Pain in right shoulder: Secondary | ICD-10-CM | POA: Diagnosis not present

## 2020-07-21 DIAGNOSIS — G8929 Other chronic pain: Secondary | ICD-10-CM | POA: Diagnosis not present

## 2020-07-21 DIAGNOSIS — M6281 Muscle weakness (generalized): Secondary | ICD-10-CM | POA: Diagnosis not present

## 2020-07-21 NOTE — Therapy (Signed)
[facility] [address] [place], [place], [address] Phone: [phone]   Fax:  [phone]  Physical Therapy Treatment  Patient Details  Name: Mary Morrison MRN: [id number] Date of Birth: [birth year] Referring Provider (PT): Christena Deem MD   Encounter Date: 07/21/2020   PT End of Session - 07/21/20 1559    Visit Number 2    Number of Visits 12    Date for PT Re-Evaluation 08/30/20    Progress Note Due on Visit 10    PT Start Time 1559    PT Stop Time 1638    PT Time Calculation (min) 39 min    Activity Tolerance Patient tolerated treatment well    Behavior During Therapy [facility] for tasks assessed/performed           Past Medical History:  Diagnosis Date  . Arthritis   . Asthma    EXERCISED INDUCED 2014   . Depression   . Fibroids   . Obesity   . Seasonal allergies   . Varicose vein of leg     Past Surgical History:  Procedure Laterality Date  . BILATERAL SALPINGECTOMY Bilateral 09/21/2014   Procedure: BILATERAL SALPINGECTOMY;  Surgeon: Serita Kyle, MD;  Location: [facility];  Service: Gynecology;  Laterality: Bilateral;  . CESAREAN SECTION  1989  . COLONOSCOPY    . LAPAROSCOPIC GASTRIC BANDING N/A 02/17/2014   Procedure: LAPAROSCOPIC GASTRIC BANDING;  Surgeon: Valarie Merino, MD;  Location: [facility];  Service: General;  Laterality: N/A;  . ROBOT ASSISTED MYOMECTOMY N/A 09/21/2014   Procedure: ROBOTIC ASSISTED MYOMECTOMY;  Surgeon: Serita Kyle, MD;  Location: [facility];  Service: Gynecology;  Laterality: N/A;  . ROBOTIC ASSISTED TOTAL HYSTERECTOMY N/A 09/21/2014   Procedure: ROBOTIC ASSISTED TOTAL HYSTERECTOMY, EXCISION OF PELVIC ENDOMETRIOSIS;  Surgeon: Serita Kyle, MD;  Location: [facility];  Service: Gynecology;  Laterality: N/A;  . TONSILLECTOMY    . TOTAL KNEE ARTHROPLASTY Left 07/03/2017   Procedure: LEFT TOTAL KNEE ARTHROPLASTY;  Surgeon: Cammy Copa, MD;  Location: [facility] OR;  Service:  Orthopedics;  Laterality: Left;  . TOTAL KNEE ARTHROPLASTY Right 07/10/2019   Procedure: RIGHT TOTAL KNEE ARTHROPLASTY;  Surgeon: Cammy Copa, MD;  Location: [facility] OR;  Service: Orthopedics;  Laterality: Right;    There were no vitals filed for this visit.   Subjective Assessment - 07/21/20 1601    Subjective Pt. indicated feeling soreness for a day or so and then felt some improvement until last few days.    Limitations House hold activities;Lifting    Patient Stated Goals Reduce pain    Currently in Pain? Yes    Pain Score --   mild to moderate   Pain Orientation Right    Pain Descriptors / Indicators Aching    Pain Type Chronic pain    Pain Onset More than a month ago    Pain Frequency Intermittent                             [facility] Adult PT Treatment/Exercise - 07/21/20 0001      Shoulder Exercises: Standing   Other Standing Exercises tband blue 3 x 10, gh ext 3 x 10    Other Standing Exercises reactive ER hold blue band 10 sec x 5      Shoulder Exercises: ROM/Strengthening   UBE (Upper Arm Bike) UBE UE/LE 5 mins lvl 4.5 fwd, 5 mins back Lvl 3.0  Manual Therapy   Manual therapy comments compression to Rt infraspinatus            Trigger Point Dry Needling - 07/21/20 0001    Consent Given? Yes    Education Handout Provided No    Infraspinatus Response Twitch response elicited                  PT Short Term Goals - 07/05/20 1606      PT SHORT TERM GOAL #1   Title Patient will demonstrate independent use of home exercise program to maintain progress from in clinic treatments.    Time 3    Period Weeks    Target Date 07/26/20             PT Long Term Goals - 07/05/20 1648      PT LONG TERM GOAL #1   Title Patient will demonstrate/report pain at worst less than or equal to 2/10 to facilitate minimal limitation in daily activity secondary to pain symptoms.    Time 8    Period Weeks    Status New    Target Date 08/30/20       PT LONG TERM GOAL #2   Title Patient will demonstrate independent use of home exercise program to facilitate ability to maintain/progress functional gains from skilled physical therapy services.    Time 8    Period Weeks    Status New    Target Date 08/30/20      PT LONG TERM GOAL #3   Title Patient will demonstrate return to work/recreational activity at previous level of function without limitations secondary due to condition    Time 8    Period Weeks    Status New    Target Date 08/30/20      PT LONG TERM GOAL #4   Title Patient will demonstrate Rt [place] joint mobility WFL to facilitate usual self care, dressing, reaching overhead at PLOF s limitation due to symptoms.    Time 8    Period Weeks    Status New    Target Date 08/30/20      PT LONG TERM GOAL #5   Title Patient will demonstrate Rt UE MMT 5/5 throughout to facilitate usual lifting, carrying in functional activity to PLOF s limitation.    Time 8    Period Weeks    Status New    Target Date 08/30/20                 Plan - 07/21/20 1616    Clinical Impression Statement Fair tolerance to manual intervention continued during actual treatment but indications of improvement in symptoms noted and reported since first visit.  Pt. may continue to benefit from skilled PT services for symptom relief, improved strength and overall development of exercise program for home.    Personal Factors and Comorbidities Other   arthritis, exercise induced asthma, depression, obesity   Examination-Activity Limitations Carry;Dressing;Hygiene/Grooming;Lift;Reach Overhead    Examination-Participation Restrictions Yard Work;Cleaning;Laundry    Stability/Clinical Decision Making Stable/Uncomplicated    Clinical Decision Making Low    Rehab Potential Good    PT Frequency --   1-2x/week   PT Duration 8 weeks    PT Treatment/Interventions ADLs/Self Care Home Management;Cryotherapy;Electrical Stimulation;Iontophoresis 4mg /ml  Dexamethasone;Moist Heat;Balance training;Traction;Therapeutic exercise;Therapeutic activities;Functional mobility training;Stair training;Gait training;Ultrasound;Neuromuscular re-education;Patient/family education;Manual techniques;Taping;Passive range of motion;Spinal Manipulations;Joint Manipulations;Dry needling    PT Next Visit Plan DN as appropriate and tolerated, improve strength control.    PT Home Exercise Plan  [facility]    Consulted and Agree with Plan of Care Patient           Patient will benefit from skilled therapeutic intervention in order to improve the following deficits and impairments:  Decreased endurance,Decreased activity tolerance,Decreased strength,Impaired UE functional use,Increased fascial restricitons,Increased muscle spasms,Pain,Decreased mobility,Decreased range of motion,Improper body mechanics,Postural dysfunction,Impaired flexibility  Visit Diagnosis: Chronic right shoulder pain  Muscle weakness (generalized)  Abnormal posture     Problem List Patient Active Problem List   Diagnosis Date Noted  . Arthritis of right knee 07/10/2019  . Arthritis of knee 07/03/2017  . Varicose veins of bilateral lower extremities with other complications [id number]  . Acute pain of left knee 01/25/2017  . Swelling of left knee joint 01/25/2017  . S/P hysterectomy 09/21/2014  . Lapband APS July 2015 02/17/2014  . Edema-chronic pitting in legs 01/20/2014  . Morbid obesity ([place]) 08/28/2013   Scot Jun, PT, DPT, OCS, ATC 07/21/20  4:40 PM    [facility] [address] [place], [place], [address] Phone: [phone]   Fax:  [phone]  Name: LATWANDA HEPBURN MRN: [id number] Date of Birth: 1965/11/03

## 2020-07-29 ENCOUNTER — Encounter: Payer: BC Managed Care – PPO | Admitting: Physical Therapy

## 2020-07-30 ENCOUNTER — Other Ambulatory Visit: Payer: Self-pay

## 2020-07-30 ENCOUNTER — Encounter: Payer: Self-pay | Admitting: Rehabilitative and Restorative Service Providers"

## 2020-07-30 ENCOUNTER — Ambulatory Visit (INDEPENDENT_AMBULATORY_CARE_PROVIDER_SITE_OTHER): Payer: BC Managed Care – PPO | Admitting: Rehabilitative and Restorative Service Providers"

## 2020-07-30 DIAGNOSIS — G8929 Other chronic pain: Secondary | ICD-10-CM

## 2020-07-30 DIAGNOSIS — R293 Abnormal posture: Secondary | ICD-10-CM | POA: Diagnosis not present

## 2020-07-30 DIAGNOSIS — M6281 Muscle weakness (generalized): Secondary | ICD-10-CM

## 2020-07-30 DIAGNOSIS — M25511 Pain in right shoulder: Secondary | ICD-10-CM

## 2020-07-30 NOTE — Therapy (Addendum)
[facility] [address] [place], [place], [address] Phone: [phone]   Fax:  [phone]  Physical Therapy Treatment/Discharge  Patient Details  Name: Mary Morrison MRN: [id number] Date of Birth: Oct 21, 1965 Referring Provider (PT): Inez Catalina MD   Encounter Date: 07/30/2020   PT End of Session - 07/30/20 1359    Visit Number 3    Number of Visits 12    Date for PT Re-Evaluation 08/30/20    Progress Note Due on Visit 10    PT Start Time 1350    PT Stop Time 1418    PT Time Calculation (min) 28 min    Activity Tolerance Patient tolerated treatment well    Behavior During Therapy [facility] for tasks assessed/performed           Past Medical History:  Diagnosis Date  . Arthritis   . Asthma    EXERCISED INDUCED 2014   . Depression   . Fibroids   . Obesity   . Seasonal allergies   . Varicose vein of leg     Past Surgical History:  Procedure Laterality Date  . BILATERAL SALPINGECTOMY Bilateral 09/21/2014   Procedure: BILATERAL SALPINGECTOMY;  Surgeon: Marvene Staff, MD;  Location: [place] [facility];  Service: Gynecology;  Laterality: Bilateral;  . CESAREAN SECTION  1989  . COLONOSCOPY    . LAPAROSCOPIC GASTRIC BANDING N/A 02/17/2014   Procedure: LAPAROSCOPIC GASTRIC BANDING;  Surgeon: Pedro Earls, MD;  Location: [facility];  Service: General;  Laterality: N/A;  . ROBOT ASSISTED MYOMECTOMY N/A 09/21/2014   Procedure: ROBOTIC ASSISTED MYOMECTOMY;  Surgeon: Marvene Staff, MD;  Location: [place] [facility];  Service: Gynecology;  Laterality: N/A;  . ROBOTIC ASSISTED TOTAL HYSTERECTOMY N/A 09/21/2014   Procedure: ROBOTIC ASSISTED TOTAL HYSTERECTOMY, EXCISION OF PELVIC ENDOMETRIOSIS;  Surgeon: Marvene Staff, MD;  Location: [place] [facility];  Service: Gynecology;  Laterality: N/A;  . TONSILLECTOMY    . TOTAL KNEE ARTHROPLASTY Left 07/03/2017   Procedure: LEFT TOTAL KNEE ARTHROPLASTY;  Surgeon: Meredith Pel, MD;  Location: [place];  Service:  Orthopedics;  Laterality: Left;  . TOTAL KNEE ARTHROPLASTY Right 07/10/2019   Procedure: RIGHT TOTAL KNEE ARTHROPLASTY;  Surgeon: Meredith Pel, MD;  Location: [place];  Service: Orthopedics;  Laterality: Right;    There were no vitals filed for this visit.   Subjective Assessment - 07/30/20 1351    Subjective Pt. stated doing great until yesterday and then felt pain return again.    Limitations House hold activities;Lifting    Patient Stated Goals Reduce pain    Currently in Pain? Yes    Pain Score 3     Pain Location Shoulder    Pain Orientation Right    Pain Descriptors / Indicators Aching    Pain Onset More than a month ago              [facility] PT Assessment - 07/30/20 0001      Observation/Other Assessments   Focus on Therapeutic Outcomes (FOTO)  update 50%                         [facility] Adult PT Treatment/Exercise - 07/30/20 0001      Shoulder Exercises: ROM/Strengthening   UBE (Upper Arm Bike) UBE fwd/back 5 mins each way      Manual Therapy   Manual therapy comments compression to Rt infraspinatus            Trigger Point Dry Needling -  07/30/20 0001    Consent Given? Yes    Muscles Treated Upper Quadrant Infraspinatus    Infraspinatus Response Twitch response elicited                  PT Short Term Goals - 07/30/20 1413      PT SHORT TERM GOAL #1   Title Patient will demonstrate independent use of home exercise program to maintain progress from in clinic treatments.    Time 3    Period Weeks    Status Achieved    Target Date 07/26/20             PT Long Term Goals - 07/30/20 1413      PT LONG TERM GOAL #1   Title Patient will demonstrate/report pain at worst less than or equal to 2/10 to facilitate minimal limitation in daily activity secondary to pain symptoms.    Time 8    Period Weeks    Status On-going    Target Date 08/30/20      PT LONG TERM GOAL #2   Title Patient will demonstrate independent use of home  exercise program to facilitate ability to maintain/progress functional gains from skilled physical therapy services.    Time 8    Period Weeks    Status On-going    Target Date 08/30/20      PT LONG TERM GOAL #3   Title Patient will demonstrate return to work/recreational activity at previous level of function without limitations secondary due to condition    Time 8    Period Weeks    Status On-going    Target Date 08/30/20      PT LONG TERM GOAL #4   Title Patient will demonstrate Rt [place] joint mobility WFL to facilitate usual self care, dressing, reaching overhead at PLOF s limitation due to symptoms.    Time 8    Period Weeks    Status On-going    Target Date 08/30/20      PT LONG TERM GOAL #5   Title Patient will demonstrate Rt UE MMT 5/5 throughout to facilitate usual lifting, carrying in functional activity to PLOF s limitation.    Time 8    Period Weeks    Status On-going    Target Date 08/30/20                 Plan - 07/30/20 1409    Clinical Impression Statement Pt. continued to report improvement from visits in clinic with manual interventions.  Pt. indicated cost of treatment may be barrier for continued in person treatment.  Pt. may benefit from skilled PT prn for symptom relief paired c HEP.    Personal Factors and Comorbidities Other   arthritis, exercise induced asthma, depression, obesity   Examination-Activity Limitations Carry;Dressing;Hygiene/Grooming;Lift;Reach Overhead    Examination-Participation Restrictions Yard Work;Cleaning;Laundry    Stability/Clinical Decision Making Stable/Uncomplicated    Rehab Potential Good    PT Frequency --   1-2x/week   PT Duration 8 weeks    PT Treatment/Interventions ADLs/Self Care Home Management;Cryotherapy;Electrical Stimulation;Iontophoresis 71m/ml Dexamethasone;Moist Heat;Balance training;Traction;Therapeutic exercise;Therapeutic activities;Functional mobility training;Stair training;Gait  training;Ultrasound;Neuromuscular re-education;Patient/family education;Manual techniques;Taping;Passive range of motion;Spinal Manipulations;Joint Manipulations;Dry needling    PT Next Visit Plan DN as appropriate and tolerated, improve strength control.    [facility]   Consulted and Agree with Plan of Care Patient           Patient will benefit from skilled therapeutic intervention in order to  improve the following deficits and impairments:  Decreased endurance,Decreased activity tolerance,Decreased strength,Impaired UE functional use,Increased fascial restricitons,Increased muscle spasms,Pain,Decreased mobility,Decreased range of motion,Improper body mechanics,Postural dysfunction,Impaired flexibility  Visit Diagnosis: Chronic right shoulder pain  Muscle weakness (generalized)  Abnormal posture     Problem List Patient Active Problem List   Diagnosis Date Noted  . Arthritis of right knee 07/10/2019  . Arthritis of knee 07/03/2017  . Varicose veins of bilateral lower extremities with other complications [id number]  . Acute pain of left knee 01/25/2017  . Swelling of left knee joint 01/25/2017  . S/P hysterectomy 09/21/2014  . Lapband APS July 2015 02/17/2014  . Edema-chronic pitting in legs 01/20/2014  . Morbid obesity ([place]) 08/28/2013    [place]ot Jun, PT, DPT, OCS, ATC 07/30/20  2:20 PM  PHYSICAL THERAPY DISCHARGE SUMMARY  Visits from Start of Care: 3  Current functional level related to goals / functional outcomes: See note   Remaining deficits: See note   Education / Equipment: HEP  Plan: Patient agrees to discharge.  Patient goals were partially met. Patient is being discharged due to not returning since the last visit.  ?????    [place] Jun, PT, DPT, OCS, ATC 09/08/20  11:14 AM     [facility] [address] [place], [place], [address] Phone: [phone]   Fax:  [phone]  Name:  EMILIJA BOHMAN MRN: [id number] Date of Birth: 1965-08-12

## 2020-08-03 ENCOUNTER — Encounter: Payer: BC Managed Care – PPO | Admitting: Rehabilitative and Restorative Service Providers"

## 2020-08-10 ENCOUNTER — Encounter: Payer: BC Managed Care – PPO | Admitting: Rehabilitative and Restorative Service Providers"

## 2020-09-06 DIAGNOSIS — Z1231 Encounter for screening mammogram for malignant neoplasm of breast: Secondary | ICD-10-CM | POA: Diagnosis not present

## 2020-09-11 DIAGNOSIS — S81812A Laceration without foreign body, left lower leg, initial encounter: Secondary | ICD-10-CM | POA: Diagnosis not present

## 2020-09-24 DIAGNOSIS — T798XXA Other early complications of trauma, initial encounter: Secondary | ICD-10-CM | POA: Diagnosis not present

## 2020-09-28 DIAGNOSIS — F3341 Major depressive disorder, recurrent, in partial remission: Secondary | ICD-10-CM | POA: Diagnosis not present

## 2020-09-28 DIAGNOSIS — D7589 Other specified diseases of blood and blood-forming organs: Secondary | ICD-10-CM | POA: Diagnosis not present

## 2020-09-28 DIAGNOSIS — I10 Essential (primary) hypertension: Secondary | ICD-10-CM | POA: Diagnosis not present

## 2020-10-13 ENCOUNTER — Telehealth: Payer: Self-pay | Admitting: Hematology

## 2020-10-13 NOTE — Telephone Encounter (Signed)
R/s 4/5 apt per patient request. Called and spoke with patient. Confirmed appt

## 2020-10-26 ENCOUNTER — Ambulatory Visit: Payer: BC Managed Care – PPO | Admitting: Hematology

## 2020-10-26 ENCOUNTER — Other Ambulatory Visit: Payer: BC Managed Care – PPO

## 2020-10-28 NOTE — Progress Notes (Signed)
HEMATOLOGY/ONCOLOGY CLINIC NOTE  Date of Service: 10/29/2020  Patient Care Team: Harlan Stains, MD as PCP - General (Family Medicine) Kennith Center, RD as Dietitian (Family Medicine)  REFERRING PHYSICIAN: Harlan Stains, MD  CHIEF COMPLAINTS/PURPOSE OF CONSULTATION:  Macrocytosis   HISTORY OF PRESENTING ILLNESS:  Mary Morrison is a wonderful 55 y.o. female who has been referred to [place] by Harlan Stains, MD for evaluation and management of macrocytosis. The pt reports that she is doing well overall.   The pt reports she is good. Pt has been taking Vitamin B12 supplement and iron supplement. She has been hurting all over and hardly able to walk in the morning. It has been specifically her right shoulder to arm numbness and weakness. She has wound healing issues and wounds take longer to heal. Pt has chronic pedal edema. Pt previously had lap band surgery and she has not restrictions. The fluid has been taken out.   She is not on acid suppressants. 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 She has not had thyroid issues and knows of no known thyroid issues within the family. Pt has been generically tested and has no mutations. She is keeping up with her age appropriate cancer screenings. Pt has not had liver problems like fatty liver or enzyme abnormalities. She has not gotten an abdominal CT in the last few years.   Most recent lab results (01/16/20) of CBC is as follows: all values are WNL except for RBC at 3.91, MCV at 103.7, MCH at 34.6, Lymph# at 1, Glucose at 115, GFR (Non-African-American) at 55, Sodium at 147, CO2 at 33  01/16/20 of Vitamin B12 at 255: WNL 65/03/54 of Folate (Folic Acid), Serum at 8.9: WNL  On review of systems, pt reports pedal edema, fatigue, general pain/ache, night sweats, hot flashes and denies bleeding issues, unexpected change in weight, blood/black stool, gum bleeds, nose bleeds, fevers, chills and any other symptoms.   On PMHx the pt reports 2 knee  replacements, bariatric surgery (lap band), chronic pedal edema   On Social Hx the pt reports 20 years sober, no cigarette use   On Family Hx the pt reports mother breast cancer, and vaginal cancer, maternal grandmother myocardial infarction, maternal grandfather CVA, no blood disorders   INTERVAL HISTORY:  Mary Morrison is a wonderful 55 y.o. female who is here for evaluation and management of macrocytosis. The patient's last visit with [place] was on 06/29/2020. The pt reports that she is doing well overall.  The pt reports that she is still dealing with the open infected wound on her left leg and has finished the third round of antibiotics. This is from the incident with an iron gate she had.  Lab results today 10/29/2020 of CBC w/diff and CMP is as follows: all values are WNL except for MCV of 106.6, MCH of 35.3, Glucose of 105, Calcium of 8.7, Total Protein of 6.4. 10/29/2020 MMP - M spike stable at 0.3g/dl 10/29/2020 Methylmalonic acid pending. 10/29/2020 Vitamin B12 is 446  On review of systems, pt reports planned weight loss, infected spot in lower leg and denies any other symptoms.   MEDICAL HISTORY:  Past Medical History:  Diagnosis Date  . Arthritis   . Asthma    EXERCISED INDUCED 2014   . Depression   . Fibroids   . Obesity   . Seasonal allergies   . Varicose vein of leg      SURGICAL HISTORY: Past Surgical History:  Procedure  Laterality Date  . BILATERAL SALPINGECTOMY Bilateral 09/21/2014   Procedure: BILATERAL SALPINGECTOMY;  Surgeon: Marvene Staff, MD;  Location: [facility];  Service: Gynecology;  Laterality: Bilateral;  . CESAREAN SECTION  1989  . COLONOSCOPY    . LAPAROSCOPIC GASTRIC BANDING N/A 02/17/2014   Procedure: LAPAROSCOPIC GASTRIC BANDING;  Surgeon: Pedro Earls, MD;  Location: [facility];  Service: General;  Laterality: N/A;  . ROBOT ASSISTED MYOMECTOMY N/A 09/21/2014   Procedure: ROBOTIC ASSISTED MYOMECTOMY;  Surgeon: Marvene Staff, MD;   Location: [facility];  Service: Gynecology;  Laterality: N/A;  . ROBOTIC ASSISTED TOTAL HYSTERECTOMY N/A 09/21/2014   Procedure: ROBOTIC ASSISTED TOTAL HYSTERECTOMY, EXCISION OF PELVIC ENDOMETRIOSIS;  Surgeon: Marvene Staff, MD;  Location: [facility];  Service: Gynecology;  Laterality: N/A;  . TONSILLECTOMY    . TOTAL KNEE ARTHROPLASTY Left 07/03/2017   Procedure: LEFT TOTAL KNEE ARTHROPLASTY;  Surgeon: Meredith Pel, MD;  Location: [facility];  Service: Orthopedics;  Laterality: Left;  . TOTAL KNEE ARTHROPLASTY Right 07/10/2019   Procedure: RIGHT TOTAL KNEE ARTHROPLASTY;  Surgeon: Meredith Pel, MD;  Location: [facility];  Service: Orthopedics;  Laterality: Right;     SOCIAL HISTORY: Social History   Socioeconomic History  . Marital status: Divorced    Spouse name: Not on file  . Number of children: Not on file  . Years of education: Not on file  . Highest education level: Not on file  Occupational History  . Not on file  Tobacco Use  . Smoking status: Never Smoker  . Smokeless tobacco: Never Used  Vaping Use  . Vaping Use: Never used  Substance and Sexual Activity  . Alcohol use: No  . Drug use: No  . Sexual activity: Not Currently    Birth control/protection: None  Other Topics Concern  . Not on file  Social History Narrative  . Not on file   Social Determinants of Health   Financial Resource Strain: Not on file  Food Insecurity: Not on file  Transportation Needs: Not on file  Physical Activity: Not on file  Stress: Not on file  Social Connections: Not on file  Intimate Partner Violence: Not on file     FAMILY HISTORY: Family History  Problem Relation Age of Onset  . Cancer Mother        breast/bilateral mastectomies  . Diabetes Other   . Hypertension Other   . Stroke Other   . Obesity Other      ALLERGIES:   has No Known Allergies.   MEDICATIONS:  Current Outpatient Medications  Medication Sig Dispense Refill  . albuterol (VENTOLIN HFA) 108 (90  Base) MCG/ACT inhaler Inhale 1-2 puffs into the lungs every 6 (six) hours as needed for wheezing or shortness of breath.    Marland Kitchen amoxicillin (AMOXIL) 500 MG tablet Take 2 grams 1 hour prior to dental procedure. (Patient taking differently: Take 2,000 mg by mouth See admin instructions. Take 2 grams by mouth 1 hour prior to dental procedure.) 16 tablet 0  . Ascorbic Acid (VITAMIN C) 1000 MG tablet Take 1,000 mg by mouth daily.    Marland Kitchen aspirin 81 MG chewable tablet Chew 1 tablet (81 mg total) by mouth 2 (two) times daily. (Patient not taking: Reported on 02/05/2020) 60 tablet 0  . azithromycin (ZITHROMAX) 250 MG tablet 2 tab (500mg ) on day 1 then 250mg  po daily for 4 days 6 each 0  . buPROPion (WELLBUTRIN XL) 300 MG 24 hr tablet Take 300 mg by mouth  daily.     . Calcium-Vitamin D-Vitamin K (VIACTIV PO) Take 1 tablet by mouth daily.    . Cyanocobalamin (B-12 PO) Take 1 tablet by mouth daily. Patient is unsure of dose    . diclofenac Sodium (VOLTAREN) 1 % GEL Apply 2 g topically daily as needed (pain).    Marland Kitchen docusate sodium (COLACE) 100 MG capsule Take 1 capsule (100 mg total) by mouth 2 (two) times daily. (Patient not taking: Reported on 08/08/2019) 10 capsule 0  . escitalopram (LEXAPRO) 10 MG tablet Take 10 mg by mouth at bedtime.    . Ferrous Sulfate (IRON PO) Take 65 mg by mouth daily.    Marland Kitchen ibuprofen (ADVIL) 800 MG tablet TAKE 1 TABLET BY MOUTH EVERY 8 (EIGHT) HOURS AS NEEDED FOR MODERATE PAIN (MUST TAKE WITH FOOD). 60 tablet 2  . Menthol, Topical Analgesic, (BIOFREEZE EX) Apply 1 application topically daily as needed (pain).    . methocarbamol (ROBAXIN) 500 MG tablet Take 1 tablet (500 mg total) by mouth every 8 (eight) hours as needed for muscle spasms. (Patient not taking: Reported on 08/08/2019) 30 tablet 0  . Multiple Vitamins-Minerals (EMERGEN-C VITAMIN C PO) Take 1 tablet by mouth daily.    . Probiotic Product (PROBIOTIC PO) Take 1 capsule by mouth daily.    . traMADol (ULTRAM) 50 MG tablet Take 1  tablet (50 mg total) by mouth every 6 (six) hours as needed. 60 tablet 0  . traZODone (DESYREL) 50 MG tablet Take 100 mg by mouth at bedtime.     . triamterene-hydrochlorothiazide (MAXZIDE-25) 37.5-25 MG tablet Take 1 tablet by mouth daily.   0  . zinc gluconate 50 MG tablet Take 50 mg by mouth daily.     No current facility-administered medications for this visit.     REVIEW OF SYSTEMS:   10 Point review of Systems was done is negative except as noted above.  PHYSICAL EXAMINATION: ECOG PERFORMANCE STATUS: 1 - Symptomatic but completely ambulatory   .BP (!) 147/98 (BP Location: Right Arm, Patient Position: Sitting) Comment: nurse notified  Pulse 81   Temp 97.9 F (36.6 C) (Tympanic)   Resp 19   Ht 5\' 6"  (1.676 m)   Wt 265 lb 8 oz (120.4 kg)   LMP 02/10/2014   SpO2 97%   BMI 42.85 kg/m   GENERAL:alert, in no acute distress and comfortable SKIN: no acute rashes, no significant lesions EYES: conjunctiva are pink and non-injected, sclera anicteric OROPHARYNX: MMM, no exudates, no oropharyngeal erythema or ulceration NECK: supple, no JVD LYMPH:  no palpable lymphadenopathy in the cervical, axillary or inguinal regions LUNGS: clear to auscultation b/l with normal respiratory effort HEART: regular rate & rhythm ABDOMEN:  normoactive bowel sounds , non tender, not distended. Extremity: no pedal edema PSYCH: alert & oriented x 3 with fluent speech NEURO: no focal motor/sensory deficits  LABORATORY DATA:  I have reviewed the data as listed  CBC Latest Ref Rng & Units 10/29/2020 06/22/2020 02/05/2020  WBC 4.0 - 10.5 K/uL 4.7 4.5 4.8  Hemoglobin 12.0 - 15.0 g/dL 13.8 13.5 13.8  Hematocrit 36.0 - 46.0 % 41.7 41.9 42.8  Platelets 150 - 400 K/uL 213 212 250    CMP Latest Ref Rng & Units 10/29/2020 06/22/2020 02/05/2020  Glucose 70 - 99 mg/dL 105(H) 108(H) 97  BUN 6 - 20 mg/dL 11 12 15   Creatinine 0.44 - 1.00 mg/dL 0.83 0.84 0.87  Sodium 135 - 145 mmol/L 143 142 139  Potassium 3.5 -  5.1 mmol/L  3.6 4.2 4.6  Chloride 98 - 111 mmol/L 107 105 104  CO2 22 - 32 mmol/L 25 30 24   Calcium 8.9 - 10.3 mg/dL 8.7(L) 9.3 9.9  Total Protein 6.5 - 8.1 g/dL 6.4(L) 6.6 7.5  Total Bilirubin 0.3 - 1.2 mg/dL 0.5 0.6 0.5  Alkaline Phos 38 - 126 U/L 61 64 71  AST 15 - 41 U/L 20 14(L) 22  ALT 0 - 44 U/L 42 21 24      RADIOGRAPHIC STUDIES: I have personally reviewed the radiological images as listed and agreed with the findings in the report. No results found.   ASSESSMENT & PLAN:   Mary Morrison is a 55 y.o. female with:  1. RBC MAcrocytosis without significant anemia 2. IgA Kappa monoclonal paraproteinemia  -Will get additional labs today   PLAN: -Discussed pt labwork today, 10/29/2020; not anemic but still macrocytic. Chemistries stable. M spike stable. B12 WNL. -Recommended pt use compression socks for legs if she does have venous insufficiency.  -Discussed potential need for Bm Bx. Advised pt we will not pursue this at this time and will re-evaluate once MMP results received. -Continue 1000 mcg Vitamin B12 and B-complex daily.  -f/u with PCP -- monitor every 6 months SPEP if M spike >1g/dl or patient develops any new significant cytopenias plz reconsult [place] -Will see back as needed   FOLLOW UP: RTC with PCP   The total time spent in the appt was 20 minutes and more than 50% was on counseling and direct patient cares.  All of the patient's questions were answered with apparent satisfaction. The patient knows to call the clinic with any problems, questions or concerns.    Sullivan Lone MD [place] AAHIVMS [facility] Hematology/Oncology Physician [facility]  (Office):       [phone] (Work cell):  [phone] (Fax):           [phone]  10/29/2020 1:30 PM  I, Reinaldo Raddle, am acting as scribe for Dr. Sullivan Lone, MD.    .Brunetta Genera MD

## 2020-10-29 ENCOUNTER — Inpatient Hospital Stay: Payer: BC Managed Care – PPO | Attending: Hematology

## 2020-10-29 ENCOUNTER — Other Ambulatory Visit: Payer: Self-pay

## 2020-10-29 ENCOUNTER — Inpatient Hospital Stay (HOSPITAL_BASED_OUTPATIENT_CLINIC_OR_DEPARTMENT_OTHER): Payer: BC Managed Care – PPO | Admitting: Hematology

## 2020-10-29 VITALS — BP 147/98 | HR 81 | Temp 97.9°F | Resp 19 | Ht 66.0 in | Wt 265.5 lb

## 2020-10-29 DIAGNOSIS — Z9884 Bariatric surgery status: Secondary | ICD-10-CM | POA: Insufficient documentation

## 2020-10-29 DIAGNOSIS — D539 Nutritional anemia, unspecified: Secondary | ICD-10-CM | POA: Diagnosis not present

## 2020-10-29 DIAGNOSIS — D7589 Other specified diseases of blood and blood-forming organs: Secondary | ICD-10-CM | POA: Insufficient documentation

## 2020-10-29 DIAGNOSIS — R200 Anesthesia of skin: Secondary | ICD-10-CM | POA: Diagnosis not present

## 2020-10-29 DIAGNOSIS — F1021 Alcohol dependence, in remission: Secondary | ICD-10-CM | POA: Diagnosis not present

## 2020-10-29 DIAGNOSIS — R61 Generalized hyperhidrosis: Secondary | ICD-10-CM | POA: Insufficient documentation

## 2020-10-29 DIAGNOSIS — D472 Monoclonal gammopathy: Secondary | ICD-10-CM | POA: Diagnosis not present

## 2020-10-29 DIAGNOSIS — R5383 Other fatigue: Secondary | ICD-10-CM | POA: Diagnosis not present

## 2020-10-29 DIAGNOSIS — Z803 Family history of malignant neoplasm of breast: Secondary | ICD-10-CM | POA: Diagnosis not present

## 2020-10-29 DIAGNOSIS — Z96653 Presence of artificial knee joint, bilateral: Secondary | ICD-10-CM | POA: Insufficient documentation

## 2020-10-29 DIAGNOSIS — R232 Flushing: Secondary | ICD-10-CM | POA: Diagnosis not present

## 2020-10-29 DIAGNOSIS — R600 Localized edema: Secondary | ICD-10-CM | POA: Diagnosis not present

## 2020-10-29 DIAGNOSIS — R531 Weakness: Secondary | ICD-10-CM | POA: Diagnosis not present

## 2020-10-29 DIAGNOSIS — E538 Deficiency of other specified B group vitamins: Secondary | ICD-10-CM

## 2020-10-29 LAB — CMP (CANCER CENTER ONLY)
ALT: 42 U/L (ref 0–44)
AST: 20 U/L (ref 15–41)
Albumin: 3.9 g/dL (ref 3.5–5.0)
Alkaline Phosphatase: 61 U/L (ref 38–126)
Anion gap: 11 (ref 5–15)
BUN: 11 mg/dL (ref 6–20)
CO2: 25 mmol/L (ref 22–32)
Calcium: 8.7 mg/dL — ABNORMAL LOW (ref 8.9–10.3)
Chloride: 107 mmol/L (ref 98–111)
Creatinine: 0.83 mg/dL (ref 0.44–1.00)
GFR, Estimated: >60 mL/min (ref >60)
Glucose, Bld: 105 mg/dL — ABNORMAL HIGH (ref 70–99)
Potassium: 3.6 mmol/L (ref 3.5–5.1)
Sodium: 143 mmol/L (ref 135–145)
Total Bilirubin: 0.5 mg/dL (ref 0.3–1.2)
Total Protein: 6.4 g/dL — ABNORMAL LOW (ref 6.5–8.1)

## 2020-10-29 LAB — CBC WITH DIFFERENTIAL/PLATELET
Abs Immature Granulocytes: 0.02 10*3/uL (ref 0.00–0.07)
Basophils Absolute: 0 10*3/uL (ref 0.0–0.1)
Basophils Relative: 0 %
Eosinophils Absolute: 0.1 10*3/uL (ref 0.0–0.5)
Eosinophils Relative: 3 %
HCT: 41.7 % (ref 36.0–46.0)
Hemoglobin: 13.8 g/dL (ref 12.0–15.0)
Immature Granulocytes: 0 %
Lymphocytes Relative: 20 %
Lymphs Abs: 1 10*3/uL (ref 0.7–4.0)
MCH: 35.3 pg — ABNORMAL HIGH (ref 26.0–34.0)
MCHC: 33.1 g/dL (ref 30.0–36.0)
MCV: 106.6 fL — ABNORMAL HIGH (ref 80.0–100.0)
Monocytes Absolute: 0.5 10*3/uL (ref 0.1–1.0)
Monocytes Relative: 10 %
Neutro Abs: 3.1 10*3/uL (ref 1.7–7.7)
Neutrophils Relative %: 67 %
Platelets: 213 10*3/uL (ref 150–400)
RBC: 3.91 MIL/uL (ref 3.87–5.11)
RDW: 13.2 % (ref 11.5–15.5)
WBC: 4.7 10*3/uL (ref 4.0–10.5)
nRBC: 0 % (ref 0.0–0.2)

## 2020-10-29 LAB — VITAMIN B12: Vitamin B-12: 446 pg/mL (ref 180–914)

## 2020-11-01 LAB — MULTIPLE MYELOMA PANEL, SERUM
Albumin SerPl Elph-Mcnc: 3.4 g/dL (ref 2.9–4.4)
Albumin/Glob SerPl: 1.4 (ref 0.7–1.7)
Alpha 1: 0.2 g/dL (ref 0.0–0.4)
Alpha2 Glob SerPl Elph-Mcnc: 0.6 g/dL (ref 0.4–1.0)
B-Globulin SerPl Elph-Mcnc: 0.9 g/dL (ref 0.7–1.3)
Gamma Glob SerPl Elph-Mcnc: 0.7 g/dL (ref 0.4–1.8)
Globulin, Total: 2.5 g/dL (ref 2.2–3.9)
IgA: 336 mg/dL (ref 87–352)
IgG (Immunoglobin G), Serum: 452 mg/dL — ABNORMAL LOW (ref 586–1602)
IgM (Immunoglobulin M), Srm: 77 mg/dL (ref 26–217)
M Protein SerPl Elph-Mcnc: 0.3 g/dL — ABNORMAL HIGH
Total Protein ELP: 5.9 g/dL — ABNORMAL LOW (ref 6.0–8.5)

## 2020-11-02 DIAGNOSIS — Z1283 Encounter for screening for malignant neoplasm of skin: Secondary | ICD-10-CM | POA: Diagnosis not present

## 2020-11-02 DIAGNOSIS — X32XXXA Exposure to sunlight, initial encounter: Secondary | ICD-10-CM | POA: Diagnosis not present

## 2020-11-02 DIAGNOSIS — L821 Other seborrheic keratosis: Secondary | ICD-10-CM | POA: Diagnosis not present

## 2020-11-02 DIAGNOSIS — L820 Inflamed seborrheic keratosis: Secondary | ICD-10-CM | POA: Diagnosis not present

## 2020-11-02 DIAGNOSIS — L570 Actinic keratosis: Secondary | ICD-10-CM | POA: Diagnosis not present

## 2020-11-03 ENCOUNTER — Encounter (HOSPITAL_BASED_OUTPATIENT_CLINIC_OR_DEPARTMENT_OTHER): Payer: BC Managed Care – PPO | Admitting: Physician Assistant

## 2020-11-05 LAB — METHYLMALONIC ACID, SERUM: Methylmalonic Acid, Quantitative: 166 nmol/L (ref 0–378)

## 2020-12-16 ENCOUNTER — Telehealth: Payer: Self-pay

## 2020-12-16 DIAGNOSIS — Z1152 Encounter for screening for COVID-19: Secondary | ICD-10-CM | POA: Diagnosis not present

## 2020-12-16 NOTE — Telephone Encounter (Signed)
Returned call to pt . Pt enquiring about lab work done at April appointment. Pt stated she was supposed to hear back from this office ablkout lab results. Pt was wanting to make sure there was nothing she should be concerned about or act on at this time , other than what was discussed at her appointment. Will have Dr Irene Limbo review labs to make sure pt does not need to be made aware of anything specific. Will update pt via phone cal as needed.

## 2020-12-29 DIAGNOSIS — R52 Pain, unspecified: Secondary | ICD-10-CM | POA: Diagnosis not present

## 2020-12-29 DIAGNOSIS — R059 Cough, unspecified: Secondary | ICD-10-CM | POA: Diagnosis not present

## 2020-12-29 DIAGNOSIS — R5383 Other fatigue: Secondary | ICD-10-CM | POA: Diagnosis not present

## 2020-12-30 DIAGNOSIS — R059 Cough, unspecified: Secondary | ICD-10-CM | POA: Diagnosis not present

## 2020-12-30 DIAGNOSIS — Z03818 Encounter for observation for suspected exposure to other biological agents ruled out: Secondary | ICD-10-CM | POA: Diagnosis not present

## 2020-12-30 DIAGNOSIS — M791 Myalgia, unspecified site: Secondary | ICD-10-CM | POA: Diagnosis not present

## 2021-01-26 DIAGNOSIS — D472 Monoclonal gammopathy: Secondary | ICD-10-CM | POA: Diagnosis not present

## 2021-01-26 DIAGNOSIS — F3341 Major depressive disorder, recurrent, in partial remission: Secondary | ICD-10-CM | POA: Diagnosis not present

## 2021-01-26 DIAGNOSIS — Z1159 Encounter for screening for other viral diseases: Secondary | ICD-10-CM | POA: Diagnosis not present

## 2021-01-26 DIAGNOSIS — Z0000 Encounter for general adult medical examination without abnormal findings: Secondary | ICD-10-CM | POA: Diagnosis not present

## 2021-01-26 DIAGNOSIS — I10 Essential (primary) hypertension: Secondary | ICD-10-CM | POA: Diagnosis not present

## 2021-01-26 DIAGNOSIS — R0609 Other forms of dyspnea: Secondary | ICD-10-CM | POA: Diagnosis not present

## 2021-01-26 DIAGNOSIS — D7589 Other specified diseases of blood and blood-forming organs: Secondary | ICD-10-CM | POA: Diagnosis not present

## 2021-01-26 LAB — HEPATIC FUNCTION PANEL
ALT: 25 (ref 7–35)
AST: 17 (ref 13–35)
Alkaline Phosphatase: 56 (ref 25–125)
Bilirubin, Total: 0.5

## 2021-01-26 LAB — CBC AND DIFFERENTIAL
Platelets: 213 (ref 150–399)
WBC: 4.4

## 2021-01-26 LAB — BASIC METABOLIC PANEL
BUN: 13 (ref 4–21)
CO2: 32 — AB (ref 13–22)
Chloride: 103 (ref 99–108)
Creatinine: 0.8 (ref 0.5–1.1)
Glucose: 102
Potassium: 3.6 (ref 3.4–5.3)
Sodium: 141 (ref 137–147)

## 2021-01-26 LAB — LIPID PANEL
Cholesterol: 176 (ref 0–200)
HDL: 64 (ref 35–70)
LDL Cholesterol: 99
Triglycerides: 67 (ref 40–160)

## 2021-01-26 LAB — COMPREHENSIVE METABOLIC PANEL
Albumin: 4.1 (ref 3.5–5.0)
Calcium: 9.1 (ref 8.7–10.7)
GFR calc non Af Amer: 8.3

## 2021-01-26 LAB — CBC: RBC: 3.9 (ref 3.87–5.11)

## 2021-01-26 LAB — TSH: TSH: 0.53 (ref 0.41–5.90)

## 2021-02-08 DIAGNOSIS — K5730 Diverticulosis of large intestine without perforation or abscess without bleeding: Secondary | ICD-10-CM | POA: Diagnosis not present

## 2021-02-08 DIAGNOSIS — K760 Fatty (change of) liver, not elsewhere classified: Secondary | ICD-10-CM | POA: Diagnosis not present

## 2021-02-08 DIAGNOSIS — R635 Abnormal weight gain: Secondary | ICD-10-CM | POA: Diagnosis not present

## 2021-02-08 DIAGNOSIS — Z1211 Encounter for screening for malignant neoplasm of colon: Secondary | ICD-10-CM | POA: Diagnosis not present

## 2021-02-16 DIAGNOSIS — G4733 Obstructive sleep apnea (adult) (pediatric): Secondary | ICD-10-CM | POA: Diagnosis not present

## 2021-02-24 DIAGNOSIS — F338 Other recurrent depressive disorders: Secondary | ICD-10-CM | POA: Diagnosis not present

## 2021-03-03 DIAGNOSIS — F338 Other recurrent depressive disorders: Secondary | ICD-10-CM | POA: Diagnosis not present

## 2021-03-08 DIAGNOSIS — G4733 Obstructive sleep apnea (adult) (pediatric): Secondary | ICD-10-CM | POA: Diagnosis not present

## 2021-03-14 DIAGNOSIS — F338 Other recurrent depressive disorders: Secondary | ICD-10-CM | POA: Diagnosis not present

## 2021-03-30 DIAGNOSIS — Z0289 Encounter for other administrative examinations: Secondary | ICD-10-CM

## 2021-04-01 ENCOUNTER — Other Ambulatory Visit: Payer: Self-pay

## 2021-04-01 ENCOUNTER — Encounter (INDEPENDENT_AMBULATORY_CARE_PROVIDER_SITE_OTHER): Payer: Self-pay | Admitting: Bariatrics

## 2021-04-01 ENCOUNTER — Ambulatory Visit (INDEPENDENT_AMBULATORY_CARE_PROVIDER_SITE_OTHER): Payer: BC Managed Care – PPO | Admitting: Bariatrics

## 2021-04-01 VITALS — BP 148/86 | HR 80 | Temp 97.7°F | Ht 67.0 in | Wt 289.0 lb

## 2021-04-01 DIAGNOSIS — Z9189 Other specified personal risk factors, not elsewhere classified: Secondary | ICD-10-CM

## 2021-04-01 DIAGNOSIS — R5383 Other fatigue: Secondary | ICD-10-CM | POA: Diagnosis not present

## 2021-04-01 DIAGNOSIS — Z1331 Encounter for screening for depression: Secondary | ICD-10-CM

## 2021-04-01 DIAGNOSIS — F5089 Other specified eating disorder: Secondary | ICD-10-CM

## 2021-04-01 DIAGNOSIS — R7309 Other abnormal glucose: Secondary | ICD-10-CM

## 2021-04-01 DIAGNOSIS — E559 Vitamin D deficiency, unspecified: Secondary | ICD-10-CM

## 2021-04-01 DIAGNOSIS — R0602 Shortness of breath: Secondary | ICD-10-CM

## 2021-04-01 DIAGNOSIS — Z9884 Bariatric surgery status: Secondary | ICD-10-CM

## 2021-04-01 DIAGNOSIS — G4739 Other sleep apnea: Secondary | ICD-10-CM

## 2021-04-01 DIAGNOSIS — K760 Fatty (change of) liver, not elsewhere classified: Secondary | ICD-10-CM

## 2021-04-01 DIAGNOSIS — M1711 Unilateral primary osteoarthritis, right knee: Secondary | ICD-10-CM | POA: Diagnosis not present

## 2021-04-01 DIAGNOSIS — Z6841 Body Mass Index (BMI) 40.0 and over, adult: Secondary | ICD-10-CM

## 2021-04-01 DIAGNOSIS — I10 Essential (primary) hypertension: Secondary | ICD-10-CM

## 2021-04-01 DIAGNOSIS — R601 Generalized edema: Secondary | ICD-10-CM

## 2021-04-01 NOTE — Progress Notes (Signed)
Dear Dr. Dema Severin,   Thank you for referring Mary Morrison to our clinic. The following note includes my evaluation and treatment recommendations.  Chief Complaint:   OBESITY Mary Morrison (MR# [id number]) is a 55 y.o. female who presents for evaluation and treatment of obesity and related comorbidities. Current BMI is Body mass index is 45.26 kg/m. Mary Morrison has been struggling with her weight for many years and has been unsuccessful in either losing weight, maintaining weight loss, or reaching her healthy weight goal.  Mary Morrison is currently in the action stage of change and ready to dedicate time achieving and maintaining a healthier weight. Mary Morrison is interested in becoming our patient and working on intensive lifestyle modifications including (but not limited to) diet and exercise for weight loss.  Mary Morrison does not like to cook and states it takes too much effort and time. She snacks after dinner.  Mary Morrison's habits were reviewed today and are as follows: she struggles with family and or coworkers weight loss sabotage, her desired weight loss is 90 lbs, she has been heavy most of her life, she has significant food cravings issues, she snacks frequently in the evenings, she wakes up frequently in the middle of the night to eat, she skips meals frequently, she is frequently drinking liquids with calories, she frequently makes poor food choices, she has problems with excessive hunger, she frequently eats larger portions than normal, and she struggles with emotional eating.  Depression Screen Mary Morrison's Food and Mood (modified PHQ-9) score was 22.  Depression screen PHQ 2/9 04/01/2021  Decreased Interest 3  Down, Depressed, Hopeless 3  PHQ - 2 Score 6  Altered sleeping 3  Tired, decreased energy 3  Change in appetite 3  Feeling bad or failure about yourself  3  Trouble concentrating 3  Moving slowly or fidgety/restless 1  PHQ-9 Score 22  Difficult doing work/chores Very difficult    Subjective:   1. Other fatigue Mary Morrison admits to daytime somnolence and admits to waking up still tired. Patent has a history of symptoms of daytime fatigue, morning fatigue, and morning headache. Mary Morrison generally gets 6 or 7 hours of sleep per night, and states that she has poor sleep quality. Snoring is present. Apneic episodes are present. Epworth Sleepiness Score is 9.  2. SOB (shortness of breath) on exertion Mary Morrison notes increasing shortness of breath with exercising and seems to be worsening over time with weight gain. She notes getting out of breath sooner with activity than she used to. This has gotten worse recently. Mary Morrison denies shortness of breath at rest or orthopnea.  3. Lapband APS July 2015 Pt still has some restriction. Decreased fluid.  4. Arthritis of right knee She has had 2 full knee replacements.  5. Elevated glucose Pt's glucose= 105. She is not on medication.  6. Generalized edema She experiences low extremity edema.  7. Vitamin D deficiency Pt is taking OTC Vit D  8. Other sleep apnea She uses a CPAP but struggling with mask (taking for 3 weeks).  9. Essential hypertension Pt is taking losartan.   10. Fatty liver Diagnosed via ultrasound.  11. Other disorder of eating Pt is going to a counselor.  12. At risk for activity intolerance Mary Morrison is at risk for exercise intolerance due to obesity, leg edema, and OSA.  Assessment/Plan:   1. Other fatigue Mary Morrison does feel that her weight is causing her energy to be lower than it should be. Fatigue may be related to obesity, depression  or many other causes. Labs will be ordered, and in the meanwhile, Mary Morrison will focus on self care including making healthy food choices, increasing physical activity and focusing on stress reduction.  2. SOB (shortness of breath) on exertion Mary Morrison does feel that she gets out of breath more easily that she used to when she exercises. Mary Morrison's shortness of breath  appears to be obesity related and exercise induced. She has agreed to work on weight loss and gradually increase exercise to treat her exercise induced shortness of breath. Will continue to monitor closely.  3. Lapband APS July 2015 Consume small, frequent meals.  4. Arthritis of right knee No pounding exercises.  5. Elevated glucose Will check A1c and insulin level.  6. Generalized edema Will continue to evaluate feet and legs.  7. Vitamin D deficiency Low Vitamin D level contributes to fatigue and are associated with obesity, breast, and colon cancer. She agrees to continue OTC Vitamin D and will follow-up for routine testing of Vitamin D, at least 2-3 times per year to avoid over-replacement.  8. Other sleep apnea Intensive lifestyle modifications are the first line treatment for this issue. We discussed several lifestyle modifications today and she will continue to work on diet, exercise and weight loss efforts. We will continue to monitor. Orders and follow up as documented in patient record.  Discussed the importance of restful sleep.  9. Essential hypertension Mary Morrison is working on healthy weight loss and exercise to improve blood pressure control. We will watch for signs of hypotension as she continues her lifestyle modifications. Continue current treatment plan.  10. Fatty liver Pt will work on diet and exercise.  11. Other disorder of eating Behavior modification techniques were discussed today to help Mary Morrison deal with her emotional/non-hunger eating behaviors.  Orders and follow up as documented in patient record. Consider referral to Dr. Mallie Mussel.  12. Depression screen Mary Morrison had a positive depression screening. Depression is commonly associated with obesity and often results in emotional eating behaviors. We will monitor this closely and work on CBT to help improve the non-hunger eating patterns. Referral to Psychology may be required if no improvement is seen as she  continues in our clinic.  13. At risk for activity intolerance Mary Morrison was given approximately 15 minutes of exercise intolerance counseling today. She is 55 y.o. female and has risk factors exercise intolerance including obesity. We discussed intensive lifestyle modifications today with an emphasis on specific weight loss instructions and strategies. Mary Morrison will slowly increase activity as tolerated.  Repetitive spaced learning was employed today to elicit superior memory formation and behavioral change.  14. Class 3 severe obesity with serious comorbidity and body mass index (BMI) of 45.0 to 49.9 in adult, unspecified obesity type (HCC)  Mary Morrison is currently in the action stage of change and her goal is to continue with weight loss efforts. I recommend Mary Morrison begin the structured treatment plan as follows:  She has agreed to the Category 2 Plan.  Meal planning. Increase water intake. 01/26/2021 labs reviewed with pt.  Exercise goals: No exercise has been prescribed at this time.   Behavioral modification strategies: increasing lean protein intake, decreasing simple carbohydrates, increasing vegetables, increasing water intake, decreasing eating out, no skipping meals, meal planning and cooking strategies, keeping healthy foods in the home, and planning for success.  She was informed of the importance of frequent follow-up visits to maximize her success with intensive lifestyle modifications for her multiple health conditions. She was informed we would discuss her lab  results at her next visit unless there is a critical issue that needs to be addressed sooner. Mary Morrison agreed to keep her next visit at the agreed upon time to discuss these results.  Objective:   Blood pressure (!) 148/86, pulse 80, temperature 97.7 F (36.5 C), height '5\' 7"'$  (1.702 m), weight 289 lb (131.1 kg), last menstrual period 02/10/2014, SpO2 96 %. Body mass index is 45.26 kg/m.  EKG: Done with Sadie Haber  01/26/2021.  Indirect Calorimeter completed today shows a VO2 of 264 and a REE of 1814.  Her calculated basal metabolic rate is 123456 thus her basal metabolic rate is worse than expected.  General: Cooperative, alert, well developed, in no acute distress. HEENT: Conjunctivae and lids unremarkable. Cardiovascular: Regular rhythm.  Lungs: Normal work of breathing. Neurologic: No focal deficits.   Lab Results  Component Value Date   CREATININE 0.83 10/29/2020   BUN 11 10/29/2020   NA 143 10/29/2020   K 3.6 10/29/2020   CL 107 10/29/2020   CO2 25 10/29/2020   Lab Results  Component Value Date   ALT 42 10/29/2020   AST 20 10/29/2020   ALKPHOS 61 10/29/2020   BILITOT 0.5 10/29/2020   No results found for: HGBA1C No results found for: INSULIN Lab Results  Component Value Date   TSH 0.813 02/24/2020   Lab Results  Component Value Date   CHOL 183 09/23/2013   HDL 52 09/23/2013   LDLCALC 118 (H) 09/23/2013   TRIG 65 09/23/2013   CHOLHDL 3.5 09/23/2013   Lab Results  Component Value Date   WBC 4.7 10/29/2020   HGB 13.8 10/29/2020   HCT 41.7 10/29/2020   MCV 106.6 (H) 10/29/2020   PLT 213 10/29/2020    Attestation Statements:   Reviewed by clinician on day of visit: allergies, medications, problem list, medical history, surgical history, family history, social history, and previous encounter notes.  Coral Ceo, CMA, am acting as Location manager for [employer], DO.  I have reviewed the above documentation for accuracy and completeness, and I agree with the above. Jearld Lesch, DO

## 2021-04-04 ENCOUNTER — Encounter (INDEPENDENT_AMBULATORY_CARE_PROVIDER_SITE_OTHER): Payer: Self-pay | Admitting: Bariatrics

## 2021-04-04 DIAGNOSIS — F338 Other recurrent depressive disorders: Secondary | ICD-10-CM | POA: Diagnosis not present

## 2021-04-04 LAB — CBC: RBC: 3.9 (ref 3.87–5.11)

## 2021-04-04 LAB — CBC AND DIFFERENTIAL
HCT: 40 (ref 36–46)
Hemoglobin: 13.5 (ref 12.0–16.0)
WBC: 4.4

## 2021-04-05 ENCOUNTER — Encounter (INDEPENDENT_AMBULATORY_CARE_PROVIDER_SITE_OTHER): Payer: Self-pay | Admitting: Bariatrics

## 2021-04-08 DIAGNOSIS — G4733 Obstructive sleep apnea (adult) (pediatric): Secondary | ICD-10-CM | POA: Diagnosis not present

## 2021-04-15 ENCOUNTER — Ambulatory Visit (INDEPENDENT_AMBULATORY_CARE_PROVIDER_SITE_OTHER): Payer: BC Managed Care – PPO | Admitting: Bariatrics

## 2021-04-15 ENCOUNTER — Encounter (INDEPENDENT_AMBULATORY_CARE_PROVIDER_SITE_OTHER): Payer: Self-pay | Admitting: Bariatrics

## 2021-04-15 ENCOUNTER — Other Ambulatory Visit: Payer: Self-pay

## 2021-04-15 VITALS — BP 131/81 | HR 74 | Temp 97.8°F | Ht 67.0 in | Wt 282.0 lb

## 2021-04-15 DIAGNOSIS — Z6841 Body Mass Index (BMI) 40.0 and over, adult: Secondary | ICD-10-CM

## 2021-04-15 DIAGNOSIS — Z9884 Bariatric surgery status: Secondary | ICD-10-CM

## 2021-04-15 DIAGNOSIS — J4520 Mild intermittent asthma, uncomplicated: Secondary | ICD-10-CM

## 2021-04-15 DIAGNOSIS — Z9189 Other specified personal risk factors, not elsewhere classified: Secondary | ICD-10-CM | POA: Diagnosis not present

## 2021-04-15 DIAGNOSIS — I10 Essential (primary) hypertension: Secondary | ICD-10-CM | POA: Diagnosis not present

## 2021-04-15 MED ORDER — ALBUTEROL SULFATE HFA 108 (90 BASE) MCG/ACT IN AERS: 1.0000 | INHALATION_SPRAY | Freq: Four times a day (QID) | RESPIRATORY_TRACT | 0 refills | Status: AC | PRN

## 2021-04-18 ENCOUNTER — Encounter (INDEPENDENT_AMBULATORY_CARE_PROVIDER_SITE_OTHER): Payer: Self-pay | Admitting: Bariatrics

## 2021-04-18 DIAGNOSIS — F338 Other recurrent depressive disorders: Secondary | ICD-10-CM | POA: Diagnosis not present

## 2021-04-18 NOTE — Progress Notes (Signed)
This encounter was created in error - please disregard.

## 2021-04-18 NOTE — Progress Notes (Signed)
Chief Complaint:   OBESITY Mary Morrison is here to discuss her progress with her obesity treatment plan along with follow-up of her obesity related diagnoses. Mary Morrison is on the Category 2 Plan and states she is following her eating plan approximately 50% of the time. Mary Morrison states she is not exercising regularly at this time.  Today's visit was #: 2 Starting weight: 289 lbs Starting date: 04/01/2021 Today's weight: 282 lbs Today's date: 04/15/2021 Total lbs lost to date: 7 lbs Total lbs lost since last in-office visit: 7 lbs  Interim History: Mary Morrison is down 7 pounds since her last visit.  She has been about 60% compliant.  She struggles with eating at night.   Subjective:   1. Essential hypertension Review: taking medications as instructed, no medication side effects noted, no chest pain on exertion, no dyspnea on exertion, no swelling of ankles. She is taking Maxzide-25.  BP Readings from Last 3 Encounters:  04/15/21 131/81  04/01/21 (!) 148/86  10/29/20 (!) 147/98   2. Mild intermittent asthma, unspecified whether complicated Worsens with exertions.  She uses an albuterol inhaler as needed.  3. At risk for heart disease Mary Morrison is at a higher than average risk for cardiovascular disease due to hypertension and obesity.    Assessment/Plan:   1. Essential hypertension Mary Morrison is working on healthy weight loss and exercise to improve blood pressure control. We will watch for signs of hypotension as she continues her lifestyle modifications.  Continue medications.  2. Mild intermittent asthma, unspecified whether complicated Will refill albuterol inhaler 1-2 puffs every 6 hours as needed for wheezing or shortness of breath, as per below.  - Refill albuterol (VENTOLIN HFA) 108 (90 Base) MCG/ACT inhaler; Inhale 1-2 puffs into the lungs every 6 (six) hours as needed for wheezing or shortness of breath.  Dispense: 1 each; Refill: 0  3. At risk for heart disease Mary Morrison was given  approximately 15 minutes of coronary artery disease prevention counseling today. She is 55 y.o. female and has risk factors for heart disease including obesity. We discussed intensive lifestyle modifications today with an emphasis on specific weight loss instructions and strategies.   Repetitive spaced learning was employed today to elicit superior memory formation and behavioral change.  4. Obesity with current BMI 44.3  Mary Morrison is currently in the action stage of change. As such, her goal is to continue with weight loss efforts. She has agreed to the Category 2 Plan.   She will work on meal planning.  We reviewed her labs that were done at her last visit.  Exercise goals: No exercise has been prescribed at this time.  Behavioral modification strategies: increasing lean protein intake, decreasing simple carbohydrates, increasing vegetables, increasing water intake, decreasing eating out, no skipping meals, meal planning and cooking strategies, keeping healthy foods in the home, and planning for success.  Mary Morrison has agreed to follow-up with our clinic in 2 weeks. She was informed of the importance of frequent follow-up visits to maximize her success with intensive lifestyle modifications for her multiple health conditions.   Objective:   Blood pressure 131/81, pulse 74, temperature 97.8 F (36.6 C), height 5\' 7"  (1.702 m), weight 282 lb (127.9 kg), last menstrual period 02/10/2014, SpO2 97 %. Body mass index is 44.17 kg/m.  General: Cooperative, alert, well developed, in no acute distress. HEENT: Conjunctivae and lids unremarkable. Cardiovascular: Regular rhythm.  Lungs: Normal work of breathing. Neurologic: No focal deficits.   Lab Results  Component Value Date  CREATININE 0.83 10/29/2020   BUN 11 10/29/2020   NA 143 10/29/2020   K 3.6 10/29/2020   CL 107 10/29/2020   CO2 25 10/29/2020   Lab Results  Component Value Date   ALT 42 10/29/2020   AST 20 10/29/2020   ALKPHOS  61 10/29/2020   BILITOT 0.5 10/29/2020   Lab Results  Component Value Date   TSH 0.813 02/24/2020   Lab Results  Component Value Date   CHOL 183 09/23/2013   HDL 52 09/23/2013   LDLCALC 118 (H) 09/23/2013   TRIG 65 09/23/2013   CHOLHDL 3.5 09/23/2013   Lab Results  Component Value Date   VD25OH 31.16 02/05/2020   Lab Results  Component Value Date   WBC 4.4 04/04/2021   HGB 13.5 04/04/2021   HCT 40 04/04/2021   MCV 106.6 (H) 10/29/2020   PLT 213 10/29/2020   Attestation Statements:   Reviewed by clinician on day of visit: allergies, medications, problem list, medical history, surgical history, family history, social history, and previous encounter notes.  I, Water quality scientist, CMA, am acting as Location manager for [employer], DO  I have reviewed the above documentation for accuracy and completeness, and I agree with the above. Jearld Lesch, DO

## 2021-04-19 ENCOUNTER — Ambulatory Visit (INDEPENDENT_AMBULATORY_CARE_PROVIDER_SITE_OTHER): Payer: Self-pay | Admitting: Bariatrics

## 2021-04-29 DIAGNOSIS — J301 Allergic rhinitis due to pollen: Secondary | ICD-10-CM | POA: Diagnosis not present

## 2021-04-29 DIAGNOSIS — R5382 Chronic fatigue, unspecified: Secondary | ICD-10-CM | POA: Diagnosis not present

## 2021-04-29 DIAGNOSIS — I10 Essential (primary) hypertension: Secondary | ICD-10-CM | POA: Diagnosis not present

## 2021-04-29 DIAGNOSIS — J4521 Mild intermittent asthma with (acute) exacerbation: Secondary | ICD-10-CM | POA: Diagnosis not present

## 2021-05-04 ENCOUNTER — Ambulatory Visit: Payer: BC Managed Care – PPO | Admitting: Cardiology

## 2021-05-04 ENCOUNTER — Other Ambulatory Visit (INDEPENDENT_AMBULATORY_CARE_PROVIDER_SITE_OTHER): Payer: Self-pay

## 2021-05-05 ENCOUNTER — Ambulatory Visit (INDEPENDENT_AMBULATORY_CARE_PROVIDER_SITE_OTHER): Payer: BC Managed Care – PPO | Admitting: Bariatrics

## 2021-05-05 ENCOUNTER — Encounter (INDEPENDENT_AMBULATORY_CARE_PROVIDER_SITE_OTHER): Payer: Self-pay | Admitting: Bariatrics

## 2021-05-05 ENCOUNTER — Other Ambulatory Visit: Payer: Self-pay

## 2021-05-05 VITALS — BP 149/86 | HR 77 | Temp 97.9°F | Ht 67.0 in | Wt 278.0 lb

## 2021-05-05 DIAGNOSIS — I10 Essential (primary) hypertension: Secondary | ICD-10-CM | POA: Diagnosis not present

## 2021-05-05 DIAGNOSIS — Z6841 Body Mass Index (BMI) 40.0 and over, adult: Secondary | ICD-10-CM

## 2021-05-05 DIAGNOSIS — J4520 Mild intermittent asthma, uncomplicated: Secondary | ICD-10-CM | POA: Diagnosis not present

## 2021-05-05 NOTE — Progress Notes (Signed)
Chief Complaint:   OBESITY Mary Morrison is here to discuss her progress with her obesity treatment plan along with follow-up of her obesity related diagnoses. Mary Morrison is on the Category 2 Plan and states she is following her eating plan approximately 75-80% of the time. Mary Morrison states she is walking the dog 4-5 times per week.  Today's visit was #: 3 Starting weight: 289 lbs Starting date: 04/01/2021 Today's weight: 278 lbs Today's date: 05/05/2021 Total lbs lost to date: 11 lbs Total lbs lost since last in-office visit: 4 lbs  Interim History: Mary Morrison is down another 4 lbs and doing well overall.  She is doing well with protein and water intake.  Subjective:   1. Essential hypertension Mary Morrison is taking Maxzide-25. Her blood pressure was slightly elevated.   2. Mild intermittent asthma, unspecified whether complicated Mary Morrison has Ventolin HFA.   Assessment/Plan:   1. Essential hypertension Shunda will continue medications. She is working on healthy weight loss and exercise to improve blood pressure control. We will watch for signs of hypotension as she continues her lifestyle modifications.  2. Mild intermittent asthma, unspecified whether complicated Mary Morrison will use Ventolin HFA as needed. She will continue to exercise.  3. Obesity with current BMI 43.5 Mary Morrison is currently in the action stage of change. As such, her goal is to continue with weight loss efforts. She has agreed to the Category 2 Plan.   Mary Morrison will be mindful eating. She will stay strongly adherent to the plan. She was given travel tips for traveling.  Exercise goals:  As is.  Behavioral modification strategies: increasing lean protein intake, decreasing simple carbohydrates, increasing vegetables, increasing water intake, decreasing eating out, no skipping meals, meal planning and cooking strategies, keeping healthy foods in the home, and planning for success.  Mary Morrison has agreed to follow-up with our  clinic in 2 weeks. She was informed of the importance of frequent follow-up visits to maximize her success with intensive lifestyle modifications for her multiple health conditions.   Objective:   Blood pressure (!) 149/86, pulse 77, temperature 97.9 F (36.6 C), height 5\' 7"  (1.702 m), weight 278 lb (126.1 kg), last menstrual period 02/10/2014, SpO2 97 %. Body mass index is 43.54 kg/m.  General: Cooperative, alert, well developed, in no acute distress. HEENT: Conjunctivae and lids unremarkable. Cardiovascular: Regular rhythm.  Lungs: Normal work of breathing. Neurologic: No focal deficits.   Lab Results  Component Value Date   CREATININE 0.8 01/26/2021   BUN 13 01/26/2021   NA 141 01/26/2021   K 3.6 01/26/2021   CL 103 01/26/2021   CO2 32 (A) 01/26/2021   Lab Results  Component Value Date   ALT 25 01/26/2021   AST 17 01/26/2021   ALKPHOS 56 01/26/2021   BILITOT 0.5 10/29/2020   No results found for: HGBA1C No results found for: INSULIN Lab Results  Component Value Date   TSH 0.53 01/26/2021   Lab Results  Component Value Date   CHOL 176 01/26/2021   HDL 64 01/26/2021   LDLCALC 99 01/26/2021   TRIG 67 01/26/2021   CHOLHDL 3.5 09/23/2013   Lab Results  Component Value Date   VD25OH 31.16 02/05/2020   Lab Results  Component Value Date   WBC 4.4 04/04/2021   HGB 13.5 04/04/2021   HCT 40 04/04/2021   MCV 106.6 (H) 10/29/2020   PLT 213 01/26/2021   No results found for: IRON, TIBC, FERRITIN  Attestation Statements:   Reviewed by clinician on day of  visit: allergies, medications, problem list, medical history, surgical history, family history, social history, and previous encounter notes.   I, Lizbeth Bark, RMA, am acting as Location manager for [employer], DO.   I have reviewed the above documentation for accuracy and completeness, and I agree with the above. Jearld Lesch, DO

## 2021-05-08 DIAGNOSIS — G4733 Obstructive sleep apnea (adult) (pediatric): Secondary | ICD-10-CM | POA: Diagnosis not present

## 2021-05-10 ENCOUNTER — Encounter (INDEPENDENT_AMBULATORY_CARE_PROVIDER_SITE_OTHER): Payer: Self-pay | Admitting: Bariatrics

## 2021-05-25 ENCOUNTER — Ambulatory Visit (INDEPENDENT_AMBULATORY_CARE_PROVIDER_SITE_OTHER): Payer: BC Managed Care – PPO | Admitting: Bariatrics

## 2021-05-29 ENCOUNTER — Emergency Department (HOSPITAL_COMMUNITY)
Admission: EM | Admit: 2021-05-29 | Discharge: 2021-05-30 | Disposition: A | Discharge status: Home / Self Care | Payer: BC Managed Care – PPO | Attending: Emergency Medicine | Admitting: Emergency Medicine

## 2021-05-29 ENCOUNTER — Emergency Department (HOSPITAL_COMMUNITY): Payer: BC Managed Care – PPO

## 2021-05-29 ENCOUNTER — Encounter (HOSPITAL_COMMUNITY): Payer: Self-pay | Admitting: Emergency Medicine

## 2021-05-29 ENCOUNTER — Other Ambulatory Visit: Payer: Self-pay

## 2021-05-29 ENCOUNTER — Emergency Department (HOSPITAL_BASED_OUTPATIENT_CLINIC_OR_DEPARTMENT_OTHER): Payer: BC Managed Care – PPO

## 2021-05-29 DIAGNOSIS — R600 Localized edema: Secondary | ICD-10-CM | POA: Insufficient documentation

## 2021-05-29 DIAGNOSIS — M79604 Pain in right leg: Secondary | ICD-10-CM

## 2021-05-29 DIAGNOSIS — Z7982 Long term (current) use of aspirin: Secondary | ICD-10-CM | POA: Insufficient documentation

## 2021-05-29 DIAGNOSIS — L03115 Cellulitis of right lower limb: Secondary | ICD-10-CM | POA: Diagnosis not present

## 2021-05-29 DIAGNOSIS — I10 Essential (primary) hypertension: Secondary | ICD-10-CM | POA: Insufficient documentation

## 2021-05-29 DIAGNOSIS — Z79899 Other long term (current) drug therapy: Secondary | ICD-10-CM | POA: Insufficient documentation

## 2021-05-29 DIAGNOSIS — J45909 Unspecified asthma, uncomplicated: Secondary | ICD-10-CM | POA: Insufficient documentation

## 2021-05-29 DIAGNOSIS — Z96651 Presence of right artificial knee joint: Secondary | ICD-10-CM | POA: Diagnosis not present

## 2021-05-29 DIAGNOSIS — L539 Erythematous condition, unspecified: Secondary | ICD-10-CM

## 2021-05-29 DIAGNOSIS — M7989 Other specified soft tissue disorders: Secondary | ICD-10-CM | POA: Diagnosis not present

## 2021-05-29 DIAGNOSIS — R609 Edema, unspecified: Secondary | ICD-10-CM | POA: Diagnosis not present

## 2021-05-29 DIAGNOSIS — Z96653 Presence of artificial knee joint, bilateral: Secondary | ICD-10-CM | POA: Diagnosis not present

## 2021-05-29 LAB — CBC WITH DIFFERENTIAL/PLATELET
Abs Immature Granulocytes: 0.02 10*3/uL (ref 0.00–0.07)
Basophils Absolute: 0 10*3/uL (ref 0.0–0.1)
Basophils Relative: 0 %
Eosinophils Absolute: 0.2 10*3/uL (ref 0.0–0.5)
Eosinophils Relative: 2 %
HCT: 40.6 % (ref 36.0–46.0)
Hemoglobin: 13.5 g/dL (ref 12.0–15.0)
Immature Granulocytes: 0 %
Lymphocytes Relative: 12 %
Lymphs Abs: 1 10*3/uL (ref 0.7–4.0)
MCH: 35.2 pg — ABNORMAL HIGH (ref 26.0–34.0)
MCHC: 33.3 g/dL (ref 30.0–36.0)
MCV: 106 fL — ABNORMAL HIGH (ref 80.0–100.0)
Monocytes Absolute: 0.6 10*3/uL (ref 0.1–1.0)
Monocytes Relative: 7 %
Neutro Abs: 6 10*3/uL (ref 1.7–7.7)
Neutrophils Relative %: 79 %
Platelets: 183 10*3/uL (ref 150–400)
RBC: 3.83 MIL/uL — ABNORMAL LOW (ref 3.87–5.11)
RDW: 13.2 % (ref 11.5–15.5)
WBC: 7.7 10*3/uL (ref 4.0–10.5)
nRBC: 0 % (ref 0.0–0.2)

## 2021-05-29 LAB — BASIC METABOLIC PANEL
Anion gap: 9 (ref 5–15)
BUN: 10 mg/dL (ref 6–20)
CO2: 29 mmol/L (ref 22–32)
Calcium: 9.3 mg/dL (ref 8.9–10.3)
Chloride: 101 mmol/L (ref 98–111)
Creatinine, Ser: 0.9 mg/dL (ref 0.44–1.00)
GFR, Estimated: >60 mL/min (ref >60)
Glucose, Bld: 97 mg/dL (ref 70–99)
Potassium: 3.8 mmol/L (ref 3.5–5.1)
Sodium: 139 mmol/L (ref 135–145)

## 2021-05-29 MED ORDER — DEXTROSE 5 % IV SOLN
1500.0000 mg | Freq: Once | INTRAVENOUS | Status: AC
  Administered 2021-05-30: 1500 mg via INTRAVENOUS
  Filled 2021-05-29: qty 75

## 2021-05-29 MED ORDER — IBUPROFEN 800 MG PO TABS
800.0000 mg | ORAL_TABLET | Freq: Once | ORAL | Status: AC
  Administered 2021-05-30: 800 mg via ORAL
  Filled 2021-05-29: qty 1

## 2021-05-29 NOTE — ED Provider Notes (Signed)
[facility] EMERGENCY DEPARTMENT Provider Note   CSN: [id number] Arrival date & time: 05/29/21  1257     History Chief Complaint  Patient presents with   leg infection    Mary Morrison is a 55 y.o. female.  Patient with a history of obesity, sleep apnea, chronic lymphedema, hypertension, anxiety presenting with a 1 day history of right leg pain, redness and swelling.  States she woke up this morning with a red patch to her right anterior shin with some pain.  Pain progressed throughout the day and she noticed spreading redness involving her lower extremity up towards her calf and knee.  Has had warmth and erythema spreading up her leg.  Got her toenails done 2 days ago where she had a small cut.  Has had chills with no fever.  Temperature 100 on arrival.  Sent from urgent care with concern for DVT.  Has had cellulitis in the past of the left leg from open wound but no open wound in the right leg today.  Denies any history of DVT.  She is not anticoagulated.  No chest pain or shortness of breath.  Denies any history of diabetes. Since about 11 AM the redness has not changed.  Redness stops to her upper calf but she worries that it is getting close to her prosthetic knee joint  The history is provided by the patient.      Past Medical History:  Diagnosis Date   ADHD    Alcohol abuse    Anxiety    Arthritis    Asthma    EXERCISED INDUCED 2014    Depression    Drug use    Edema, lower extremity    Fatty liver    Fibroids    High blood pressure    Obesity    Seasonal allergies    Sleep apnea    SOB (shortness of breath)    Varicose vein of leg    Vitamin B 12 deficiency     Patient Active Problem List   Diagnosis Date Noted   Arthritis of right knee 07/10/2019   Arthritis of knee 07/03/2017   Varicose veins of bilateral lower extremities with other complications 10/62/6948   Acute pain of left knee 01/25/2017   Swelling of left knee joint  01/25/2017   S/P hysterectomy 09/21/2014   Lapband APS July 2015 02/17/2014   Edema-chronic pitting in legs 01/20/2014   Morbid obesity ([place]) 08/28/2013    Past Surgical History:  Procedure Laterality Date   BILATERAL SALPINGECTOMY Bilateral 09/21/2014   Procedure: BILATERAL SALPINGECTOMY;  Surgeon: Marvene Staff, MD;  Location: [facility];  Service: Gynecology;  Laterality: Bilateral;   CESAREAN SECTION  1989   COLONOSCOPY     LAPAROSCOPIC GASTRIC BANDING N/A 02/17/2014   Procedure: LAPAROSCOPIC GASTRIC BANDING;  Surgeon: Pedro Earls, MD;  Location: [facility];  Service: General;  Laterality: N/A;   ROBOT ASSISTED MYOMECTOMY N/A 09/21/2014   Procedure: ROBOTIC ASSISTED MYOMECTOMY;  Surgeon: Marvene Staff, MD;  Location: [facility];  Service: Gynecology;  Laterality: N/A;   ROBOTIC ASSISTED TOTAL HYSTERECTOMY N/A 09/21/2014   Procedure: ROBOTIC ASSISTED TOTAL HYSTERECTOMY, EXCISION OF PELVIC ENDOMETRIOSIS;  Surgeon: Marvene Staff, MD;  Location: [facility];  Service: Gynecology;  Laterality: N/A;   TONSILLECTOMY     TOTAL KNEE ARTHROPLASTY Left 07/03/2017   Procedure: LEFT TOTAL KNEE ARTHROPLASTY;  Surgeon: Meredith Pel, MD;  Location: [place];  Service: Orthopedics;  Laterality: Left;  TOTAL KNEE ARTHROPLASTY Right 07/10/2019   Procedure: RIGHT TOTAL KNEE ARTHROPLASTY;  Surgeon: Meredith Pel, MD;  Location: [facility];  Service: Orthopedics;  Laterality: Right;     OB History     Gravida  1   Para  1   Term      Preterm      AB      Living         SAB      IAB      Ectopic      Multiple      Live Births              Family History  Problem Relation Age of Onset   Cancer Mother        breast/bilateral mastectomies   High blood pressure Mother    Depression Mother    Heart disease Mother    Anxiety disorder Mother    Obesity Mother    Alcoholism Father    Diabetes Other    Hypertension Other    Stroke Other    Obesity Other      Social History   Tobacco Use   Smoking status: Never   Smokeless tobacco: Never  Vaping Use   Vaping Use: Never used  Substance Use Topics   Alcohol use: No   Drug use: No    Home Medications Prior to Admission medications   Medication Sig Start Date End Date Taking? Authorizing Provider  albuterol (VENTOLIN HFA) 108 (90 Base) MCG/ACT inhaler Inhale 1-2 puffs into the lungs every 6 (six) hours as needed for wheezing or shortness of breath. 04/15/21   Jearld Lesch A, DO  Ascorbic Acid (VITAMIN C) 1000 MG tablet Take 1,000 mg by mouth daily.    [provider]  aspirin 81 MG chewable tablet Chew 1 tablet (81 mg total) by mouth 2 (two) times daily. 07/11/19   Magnant, Charles L, PA-C  buPROPion (WELLBUTRIN XL) 300 MG 24 hr tablet Take 300 mg by mouth daily.  12/20/16   [provider]  Calcium-Vitamin D-Vitamin K (VIACTIV PO) Take 1 tablet by mouth daily.    [provider]  Cyanocobalamin (B-12 PO) Take 1 tablet by mouth daily. Patient is unsure of dose    [provider]  diclofenac Sodium (VOLTAREN) 1 % GEL Apply 2 g topically daily as needed (pain).    [provider]  docusate sodium (COLACE) 100 MG capsule Take 1 capsule (100 mg total) by mouth 2 (two) times daily. 07/05/17   Meredith Pel, MD  escitalopram (LEXAPRO) 10 MG tablet Take 10 mg by mouth at bedtime.    [provider]  Ferrous Sulfate (IRON PO) Take 65 mg by mouth daily.    [provider]  ibuprofen (ADVIL) 800 MG tablet TAKE 1 TABLET BY MOUTH EVERY 8 (EIGHT) HOURS AS NEEDED FOR MODERATE PAIN (MUST TAKE WITH FOOD). 12/24/19   Magnant, Charles L, PA-C  Menthol, Topical Analgesic, (BIOFREEZE EX) Apply 1 application topically daily as needed (pain).    [provider]  methocarbamol (ROBAXIN) 500 MG tablet Take 1 tablet (500 mg total) by mouth every 8 (eight) hours as needed for muscle spasms. 07/11/19   Magnant, Gerrianne Scale, PA-C  Multiple  Vitamins-Minerals (EMERGEN-C VITAMIN C PO) Take 1 tablet by mouth daily.    [provider]  Probiotic Product (PROBIOTIC PO) Take 1 capsule by mouth daily.    [provider]  traZODone (DESYREL) 50 MG tablet Take  100 mg by mouth at bedtime.     [provider]  triamterene-hydrochlorothiazide (MAXZIDE-25) 37.5-25 MG tablet Take 1 tablet by mouth daily.  11/09/16   [provider]  zinc gluconate 50 MG tablet Take 50 mg by mouth daily.    [provider]    Allergies    Patient has no known allergies.  Review of Systems   Review of Systems  Constitutional:  Positive for chills and fever. Negative for activity change and appetite change.  HENT:  Negative for congestion and rhinorrhea.   Respiratory:  Negative for cough, chest tightness and shortness of breath.   Cardiovascular:  Positive for leg swelling.  Gastrointestinal:  Negative for abdominal pain, nausea and vomiting.  Genitourinary:  Negative for dysuria, flank pain and hematuria.  Musculoskeletal:  Negative for arthralgias and myalgias.  Skin:  Positive for wound.  Neurological:  Negative for dizziness, weakness and headaches.   all other systems are negative except as noted in the HPI and PMH.   Physical Exam Updated Vital Signs BP 128/76 (BP Location: Left Arm)   Pulse 96   Temp 99.1 F (37.3 C) (Oral)   Resp 13   LMP 02/10/2014   SpO2 98%   Physical Exam Vitals and nursing note reviewed.  Constitutional:      General: She is not in acute distress.    Appearance: She is well-developed.  HENT:     Head: Normocephalic and atraumatic.     Mouth/Throat:     Pharynx: No oropharyngeal exudate.  Eyes:     Conjunctiva/sclera: Conjunctivae normal.     Pupils: Pupils are equal, round, and reactive to light.  Neck:     Comments: No meningismus. Cardiovascular:     Rate and Rhythm: Normal rate and regular rhythm.     Heart sounds: Normal heart sounds. No murmur  heard. Pulmonary:     Effort: Pulmonary effort is normal. No respiratory distress.     Breath sounds: Normal breath sounds.  Abdominal:     Palpations: Abdomen is soft.     Tenderness: There is no abdominal tenderness. There is no guarding or rebound.  Musculoskeletal:        General: No tenderness. Normal range of motion.     Cervical back: Normal range of motion and neck supple.     Right lower leg: Edema present.     Left lower leg: Edema present.     Comments: Lower extremity edema right greater than left.  Circumferential erythema starting at right lower leg and spreading up to posterior calf.  Full range of motion of ankle and knee joint.  Intact DP and PT pulses.  Skin:    General: Skin is warm.  Neurological:     Mental Status: She is alert and oriented to person, place, and time.     Cranial Nerves: No cranial nerve deficit.     Motor: No abnormal muscle tone.     Coordination: Coordination normal.     Comments:  5/5 strength throughout. CN 2-12 intact.Equal grip strength.   Psychiatric:        Behavior: Behavior normal.       ED Results / Procedures / Treatments   Labs (all labs ordered are listed, but only abnormal results are displayed) Labs Reviewed  CBC WITH DIFFERENTIAL/PLATELET - Abnormal; Notable for the following components:      Result Value   RBC 3.83 (*)    MCV 106.0 (*)    MCH 35.2 (*)  All other components within normal limits  C-REACTIVE PROTEIN - Abnormal; Notable for the following components:   CRP 6.5 (*)    All other components within normal limits  CULTURE, BLOOD (ROUTINE X 2)  CULTURE, BLOOD (ROUTINE X 2)  BASIC METABOLIC PANEL  SEDIMENTATION RATE    EKG None  Radiology VAS [place] LOWER EXTREMITY VENOUS (DVT) (7a-7p)  Result Date: 05/29/2021  Lower Venous DVT Study Patient Name:  CRESTINA STRIKE [facility]  Date of Exam:   05/29/2021 Medical Rec #: [id number]           Accession #:    [id number] Date of Birth: 1966/03/23          Patient  Gender: F Patient Age:   76 years Exam Location:  [facility] Procedure:      VAS [place] LOWER EXTREMITY VENOUS (DVT) Referring Phys: MARGAUX VENTER --------------------------------------------------------------------------------  Indications: Pain, swelling, redness.  Comparison Study: no previous exams Performing Technologist: Jody Hill RVT, RDMS  Examination Guidelines: A complete evaluation includes B-mode imaging, spectral Doppler, color Doppler, and power Doppler as needed of all accessible portions of each vessel. Bilateral testing is considered an integral part of a complete examination. Limited examinations for reoccurring indications may be performed as noted. The reflux portion of the exam is performed with the patient in reverse Trendelenburg.  +---------+---------------+---------+-----------+----------+--------------+ RIGHT    CompressibilityPhasicitySpontaneityPropertiesThrombus Aging +---------+---------------+---------+-----------+----------+--------------+ CFV      Full           Yes      Yes                                 +---------+---------------+---------+-----------+----------+--------------+ SFJ      Full                                                        +---------+---------------+---------+-----------+----------+--------------+ FV Prox  Full           Yes      Yes                                 +---------+---------------+---------+-----------+----------+--------------+ FV Mid   Full           Yes      Yes                                 +---------+---------------+---------+-----------+----------+--------------+ FV DistalFull           Yes      Yes                                 +---------+---------------+---------+-----------+----------+--------------+ PFV      Full                                                        +---------+---------------+---------+-----------+----------+--------------+ POP      Full           Yes  Yes                                 +---------+---------------+---------+-----------+----------+--------------+ PTV      Full                                                        +---------+---------------+---------+-----------+----------+--------------+ PERO     Full                                                        +---------+---------------+---------+-----------+----------+--------------+   +----+---------------+---------+-----------+----------+--------------+ LEFTCompressibilityPhasicitySpontaneityPropertiesThrombus Aging +----+---------------+---------+-----------+----------+--------------+ CFV Full           Yes      Yes                                 +----+---------------+---------+-----------+----------+--------------+     *See table(s) above for measurements and observations.    Preliminary     Procedures Procedures   Medications Ordered in ED Medications - No data to display  ED Course  I have reviewed the triage vital signs and the nursing notes.  Pertinent labs & imaging results that were available during my care of the patient were reviewed by me and considered in my medical decision making (see chart for details).    MDM Rules/Calculators/A&P                          Lower extremity cellulitis rapidly progressing since this morning.  Chills but no fever.  Vital stable.  No distress.  Patient appears well not toxic or septic. Labs obtained in triage are reassuring.  Dopplers negative for DVT. Mild leukocytosis.  CRP and ESR elevated.  Blood culture sent.  Discussed with pharmacy if patient qualifies for Dalvance.  She does not want to be admitted Low suspicion for septic joint.  Plain film x-rays will be obtained to rule out soft tissue gas.  X-rays negative for soft tissue gas.  Patient well-appearing and nontoxic.  She desires to go home and not be admitted.  Discussed with pharmacy and she shows qualify for Dalvance.  Dalvance  given.  Borders of cellulitis marked.  Discussed PCP follow-up in 2 days with return to the ED sooner if redness spreads beyond marked borders, spreads past knee, intractable fevers at home, difficulty breathing or any other concerns. Final Clinical Impression(s) / ED Diagnoses Final diagnoses:  Cellulitis of right lower extremity    Rx / DC Orders ED Discharge Orders     None        , Annie Main, MD 05/30/21 [phone]

## 2021-05-29 NOTE — ED Provider Notes (Signed)
Emergency Medicine Provider Triage Evaluation Note  Mary Morrison , a 55 y.o. female  was evaluated in triage.  Pt complains of worsening right lower extremity swelling started yesterday.  She states throughout the night she woke up in cold sweats however did not think much of it.  Today she noticed worsening pain to her right lower leg.  She noticed a small red area to her ankle that has since grown in size.  She states it is warm to the touch.  He went to urgent care and they advised she come to the ED for further evaluation.  She does report history of cellulitis in her left leg in the past.  She is also complaining of some pain to the posterior aspect of her right leg.  She denies any history of DVT or PE.  Not anticoagulated.  Review of Systems  Positive: + right leg pain/swelling, redness, fever, chills Negative: - chest pain, SOB  Physical Exam  LMP 02/10/2014  Gen:   Awake, no distress   Resp:  Normal effort  MSK:   Moves extremities without difficulty  Other:  2+ pitting edema bilaterally however RLE more swollen compared to LLE. Large area of erythema and increased warmth to R ankle extending into mid shin with TTP. 2+ DP pulse  Medical Decision Making  Medically screening exam initiated at 1:39 PM.  Appropriate orders placed.  Charna Archer was informed that the remainder of the evaluation will be completed by another provider, this initial triage assessment does not replace that evaluation, and the importance of remaining in the ED until their evaluation is complete.     Eustaquio Maize, PA-C 05/29/21 1342    Malvin Johns, MD 05/29/21 1445

## 2021-05-29 NOTE — ED Triage Notes (Signed)
Pt reports swelling to lower extremities- R worse than L since yesterday.  States this morning she had a small red "patch" to R anterior ankle and since then she has had redness spreading up leg with warmth to leg and chills.

## 2021-05-29 NOTE — Progress Notes (Incomplete)
RLE venous duplex has been completed.  Preliminary results given to Eustaquio Maize, PA.   Results can be found under chart review under CV PROC. 05/29/2021 3:31 PM  RVT, RDMS

## 2021-05-30 ENCOUNTER — Ambulatory Visit (INDEPENDENT_AMBULATORY_CARE_PROVIDER_SITE_OTHER): Payer: BC Managed Care – PPO | Admitting: Bariatrics

## 2021-05-30 LAB — SEDIMENTATION RATE: Sed Rate: 24 mm/h — ABNORMAL HIGH (ref 0–22)

## 2021-05-30 LAB — C-REACTIVE PROTEIN: CRP: 6.5 mg/dL — ABNORMAL HIGH (ref <1.0)

## 2021-05-30 NOTE — Discharge Instructions (Signed)
You were treated with a one-time dose of antibiotics and should not need further antibiotics for home.  Follow-up with your doctor for recheck this week.  Return to the ED sooner with redness spreading beyond the marked borders, fever, difficulty breathing, chest pain, any other concerns

## 2021-05-31 DIAGNOSIS — L03115 Cellulitis of right lower limb: Secondary | ICD-10-CM | POA: Diagnosis not present

## 2021-06-03 DIAGNOSIS — L03115 Cellulitis of right lower limb: Secondary | ICD-10-CM | POA: Diagnosis not present

## 2021-06-03 DIAGNOSIS — B372 Candidiasis of skin and nail: Secondary | ICD-10-CM | POA: Diagnosis not present

## 2021-06-04 LAB — CULTURE, BLOOD (ROUTINE X 2)
Culture: NO GROWTH
Culture: NO GROWTH
Special Requests: ADEQUATE
Special Requests: ADEQUATE

## 2021-06-08 DIAGNOSIS — G4733 Obstructive sleep apnea (adult) (pediatric): Secondary | ICD-10-CM | POA: Diagnosis not present

## 2021-06-09 ENCOUNTER — Other Ambulatory Visit (INDEPENDENT_AMBULATORY_CARE_PROVIDER_SITE_OTHER): Payer: Self-pay | Admitting: Bariatrics

## 2021-06-09 DIAGNOSIS — J4520 Mild intermittent asthma, uncomplicated: Secondary | ICD-10-CM

## 2021-06-09 NOTE — Telephone Encounter (Signed)
LOV with Dr. Brown

## 2021-06-20 ENCOUNTER — Ambulatory Visit (INDEPENDENT_AMBULATORY_CARE_PROVIDER_SITE_OTHER): Payer: BC Managed Care – PPO | Admitting: Cardiology

## 2021-06-20 ENCOUNTER — Other Ambulatory Visit: Payer: Self-pay

## 2021-06-20 ENCOUNTER — Encounter: Payer: Self-pay | Admitting: Cardiology

## 2021-06-20 DIAGNOSIS — R0609 Other forms of dyspnea: Secondary | ICD-10-CM

## 2021-06-20 DIAGNOSIS — I10 Essential (primary) hypertension: Secondary | ICD-10-CM

## 2021-06-20 NOTE — Patient Instructions (Signed)
Medication Instructions:  The current medical regimen is effective;  continue present plan and medications.  *If you need a refill on your cardiac medications before your next appointment, please call your pharmacy*  Testing/Procedures: Your physician has requested that you have an echocardiogram. Echocardiography is a painless test that uses sound waves to create images of your heart. It provides your doctor with information about the size and shape of your heart and how well your heart's chambers and valves are working. This procedure takes approximately one hour. There are no restrictions for this procedure.  Your physician has ordered you to have a Coronary Calcium score which is completed by CT scan;  a noninvasive, special x-ray that produces cross-sectional images of the body using x-rays and a computer. CT scans help physicians diagnose and treat medical conditions. This testing is completed here at our office for the cost of $99.  Follow-Up: At [facility], you and your health needs are our priority.  As part of our continuing mission to provide you with exceptional heart care, we have created designated Provider Care Teams.  These Care Teams include your primary Cardiologist (physician) and Advanced Practice Providers (APPs -  Physician Assistants and Nurse Practitioners) who all work together to provide you with the care you need, when you need it.  We recommend signing up for the patient portal called "MyChart".  Sign up information is provided on this After Visit Summary.  MyChart is used to connect with patients for Virtual Visits (Telemedicine).  Patients are able to view lab/test results, encounter notes, upcoming appointments, etc.  Non-urgent messages can be sent to your provider as well.   To learn more about what you can do with MyChart, go to [email or web address].    Your next appointment:   Follow up will be determined after the above testing has been completed.  Thank  you for choosing [facility] [facility]!!

## 2021-06-20 NOTE — Progress Notes (Signed)
Cardiology Office Note:    Date:  06/20/2021   ID:  Mary Morrison 1965/11/12, MRN [id number]  PCP:  Mary Stains, MD   [facility] Providers Cardiologist:  None     Referring MD: Mary Stains, MD   History of Present Illness:    Mary Morrison is a 55 y.o. female here for the evaluation of dyspnea on exertion and r/o cardiac etiology for dyspnea. Family history notable for diastolic congestive heart failure.  05/09/2021 Notes from Dr. Dema Severin reviewed: She has asthma worse in the summer heat. Typically needs to use her inhaler prior to exercise. She reported shortness of breath and exhaustion with exertion. Her mother has diastolic heart failure.  Her paternal grandmother had a heart attack at age 71.  Maternal grandfather had MI at age 41.  Maternal grandmother died age 18.  Hemoglobin 13.5 platelets 213 ALT 25 creatinine 0.83 potassium 3.6 TSH 0.53 LDL 99 HDL 64  Venous insufficiency s/p vein ablation and sclerotherapy 04/2017  Today: Overall, she "just doesn't feel quite right." She has been struggling with fatigue and shortness of breath. She notes that she is overweight, and that she was previously diagnosed with an upper respiratory viral infection in 2020. She believes this was undiagnosed Covid and is experiencing lingering symptoms.  Lately she has been unsteady on her feet and fallen a few times. At one time she fell in her driveway due to her imbalance.  She often becomes winded with climbing stairs at home, and will use her inhaler as needed. 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  She has been trying to increase her exercise. At home she uses a Peloton bike and Hydro-rowing machine. She has also hired a Physiological scientist for assistance.   Also, she has been seeing a counselor regarding her depression.  She denies any palpitations, or chest pain. No lightheadedness, headaches, syncope,  orthopnea, PND, or lower extremity edema.  Past Medical History:  Diagnosis Date   ADHD    Alcohol abuse    Anxiety    Arthritis    Asthma    EXERCISED INDUCED 2014    Depression    Drug use    Edema, lower extremity    Fatty liver    Fibroids    High blood pressure    Obesity    Seasonal allergies    Sleep apnea    SOB (shortness of breath)    Varicose vein of leg    Vitamin B 12 deficiency     Past Surgical History:  Procedure Laterality Date   BILATERAL SALPINGECTOMY Bilateral 09/21/2014   Procedure: BILATERAL SALPINGECTOMY;  Surgeon: Marvene Staff, MD;  Location: [facility];  Service: Gynecology;  Laterality: Bilateral;   CESAREAN SECTION  1989   COLONOSCOPY     LAPAROSCOPIC GASTRIC BANDING N/A 02/17/2014   Procedure: LAPAROSCOPIC GASTRIC BANDING;  Surgeon: Pedro Earls, MD;  Location: [facility];  Service: General;  Laterality: N/A;   ROBOT ASSISTED MYOMECTOMY N/A 09/21/2014   Procedure: ROBOTIC ASSISTED MYOMECTOMY;  Surgeon: Marvene Staff, MD;  Location: [facility];  Service: Gynecology;  Laterality: N/A;   ROBOTIC ASSISTED TOTAL HYSTERECTOMY N/A 09/21/2014   Procedure: ROBOTIC ASSISTED TOTAL HYSTERECTOMY, EXCISION OF PELVIC ENDOMETRIOSIS;  Surgeon: Marvene Staff, MD;  Location: [facility];  Service: Gynecology;  Laterality: N/A;   TONSILLECTOMY     TOTAL KNEE ARTHROPLASTY Left 07/03/2017   Procedure: LEFT TOTAL KNEE ARTHROPLASTY;  Surgeon: Meredith Pel, MD;  Location: [facility];  Service: Orthopedics;  Laterality: Left;   TOTAL KNEE ARTHROPLASTY Right 07/10/2019   Procedure: RIGHT TOTAL KNEE ARTHROPLASTY;  Surgeon: Meredith Pel, MD;  Location: [facility];  Service: Orthopedics;  Laterality: Right;    Current Medications: Current Meds  Medication Sig   albuterol (VENTOLIN HFA) 108 (90 Base) MCG/ACT inhaler Inhale 1-2 puffs into the lungs every 6 (six) hours as needed for wheezing or shortness of breath.   Ascorbic Acid (VITAMIN C) 1000 MG tablet Take  1,000 mg by mouth daily.   buPROPion (WELLBUTRIN XL) 300 MG 24 hr tablet Take 300 mg by mouth daily.    Calcium-Vitamin D-Vitamin K (VIACTIV PO) Take 1 tablet by mouth daily.   Cyanocobalamin (B-12 PO) Take 1 tablet by mouth daily. Patient is unsure of dose   diclofenac Sodium (VOLTAREN) 1 % GEL Apply 2 g topically daily as needed (pain).   escitalopram (LEXAPRO) 10 MG tablet Take 10 mg by mouth at bedtime.   ibuprofen (ADVIL) 800 MG tablet TAKE 1 TABLET BY MOUTH EVERY 8 (EIGHT) HOURS AS NEEDED FOR MODERATE PAIN (MUST TAKE WITH FOOD).   losartan (COZAAR) 50 MG tablet Take 50 mg by mouth daily.   Menthol, Topical Analgesic, (BIOFREEZE EX) Apply 1 application topically daily as needed (pain).   Multiple Vitamins-Minerals (EMERGEN-C VITAMIN C PO) Take 1 tablet by mouth daily.   Probiotic Product (PROBIOTIC PO) Take 1 capsule by mouth daily.   traZODone (DESYREL) 50 MG tablet Take 100 mg by mouth at bedtime.    triamterene-hydrochlorothiazide (MAXZIDE-25) 37.5-25 MG tablet Take 1 tablet by mouth daily.    zinc gluconate 50 MG tablet Take 50 mg by mouth daily.     Allergies:   Patient has no known allergies.   Social History   Socioeconomic History   Marital status: Divorced    Spouse name: Not on file   Number of children: Not on file   Years of education: Not on file   Highest education level: Not on file  Occupational History   Occupation: Self employed  Tobacco Use   Smoking status: Never   Smokeless tobacco: Never  Vaping Use   Vaping Use: Never used  Substance and Sexual Activity   Alcohol use: No   Drug use: No   Sexual activity: Not Currently    Birth control/protection: None  Other Topics Concern   Not on file  Social History Narrative   Not on file   Social Determinants of Health   Financial Resource Strain: Not on file  Food Insecurity: Not on file  Transportation Needs: Not on file  Physical Activity: Not on file  Stress: Not on file  Social Connections: Not  on file     Family History: The patient's family history includes Alcoholism in her father; Anxiety disorder in her mother; Cancer in her mother; Depression in her mother; Diabetes in an other family member; Heart disease in her mother; High blood pressure in her mother; Hypertension in an other family member; Obesity in her mother and another family member; Stroke in an other family member.  ROS:   Please see the history of present illness.    (+) Shortness of breath (+) Fatigue (+) Multiple falls (+) Gait instability/imbalance (+) Weight gain All other systems reviewed and are negative.  EKGs/Labs/Other Studies Reviewed:    The following studies were reviewed today:  LE Venous DVT 05/29/2021: Unremarkable.  Right LE Venous Post Ablation 04/26/2017: Impression:  1. The right great saphenous vein appears to be totally-occluded from the mid-calf to approximately 3.15 cm from the saphenofemoral junction. 2. No evidence of deep vein thrombosis noted in the right femoral-popliteal venous system.  Left LE Venous Post Ablation 03/20/2017: Impression: 1. The left great saphenous vein appears to be totally occluded from the proximal calf to approximately 2.17 cm from the saphenofemoral junction. 2. No evidence of deep vein thrombosis in the vessels listed above.  EKG:  EKG is personally reviewed and interpreted. 06/20/2021: Sinus rhythm. Rate 85 bpm.  Recent Labs: 01/26/2021: ALT 25; TSH 0.53 05/29/2021: BUN 10; Creatinine, Ser 0.90; Hemoglobin 13.5; Platelets 183; Potassium 3.8; Sodium 139   Recent Lipid Panel    Component Value Date/Time   CHOL 176 01/26/2021 0000   TRIG 67 01/26/2021 0000   HDL 64 01/26/2021 0000   CHOLHDL 3.5 09/23/2013 1046   VLDL 13 09/23/2013 1046   LDLCALC 99 01/26/2021 0000     Risk Assessment/Calculations:          Physical Exam:    VS:  BP 128/82   Pulse 85   Ht 5\' 6"  (1.676 m)   Wt 287 lb 12.8 oz (130.5 kg)   LMP 02/10/2014   SpO2 98%   BMI  46.45 kg/m     Wt Readings from Last 3 Encounters:  06/20/21 287 lb 12.8 oz (130.5 kg)  05/05/21 278 lb (126.1 kg)  04/15/21 282 lb (127.9 kg)     GEN: Well nourished, well developed in no acute distress HEENT: Normal NECK: No JVD; No carotid bruits LYMPHATICS: No lymphadenopathy CARDIAC: RRR, no murmurs, rubs, gallops RESPIRATORY:  Clear to auscultation without rales, wheezing or rhonchi  ABDOMEN: Soft, non-tender, non-distended MUSCULOSKELETAL:  No edema; No deformity  SKIN: Warm and dry NEUROLOGIC:  Alert and oriented x 3 PSYCHIATRIC:  Normal affect   ASSESSMENT:    1. Dyspnea on exertion   2. Essential hypertension    PLAN:    In order of problems listed above:  Dyspnea on exertion Has been treated for asthma and typically has to use her inhaler prior to exercise.  Seems to be worse in the heat of the summer.  Her mother has a history of diastolic heart failure.  She gets quite winded with exertion.  Go ahead and check an echocardiogram to ensure proper structure and function of her heart.  I will also check calcium score.  If plaque present given her family history, will consider the use of Crestor for her.  Essential hypertension Medications reviewed Dr. Dema Severin has been on triamterene hydrochlorothiazide 37.5/25 mg       Follow-up: PRN.  With results of studies.  Agree with diet, exercise, personal trainer, weight loss efforts.  Has really machine now, Peloton bike.  Medication Adjustments/Labs and Tests Ordered: Current medicines are reviewed at length with the patient today.  Concerns regarding medicines are outlined above.   Orders Placed This Encounter  Procedures   CT CARDIAC SCORING (SELF PAY ONLY)   EKG 12-Lead   ECHOCARDIOGRAM COMPLETE     No orders of the defined types were placed in this encounter.   Patient Instructions  Medication Instructions:  The current medical regimen is effective;  continue present plan and medications.  *If you need  a refill on your cardiac medications before your next appointment, please call your pharmacy*  Testing/Procedures: Your physician has requested that you have an echocardiogram. Echocardiography is a painless test that uses sound waves to create images of  your heart. It provides your doctor with information about the size and shape of your heart and how well your heart's chambers and valves are working. This procedure takes approximately one hour. There are no restrictions for this procedure.  Your physician has ordered you to have a Coronary Calcium score which is completed by CT scan;  a noninvasive, special x-ray that produces cross-sectional images of the body using x-rays and a computer. CT scans help physicians diagnose and treat medical conditions. This testing is completed here at our office for the cost of $99.  Follow-Up: At [facility], you and your health needs are our priority.  As part of our continuing mission to provide you with exceptional heart care, we have created designated Provider Care Teams.  These Care Teams include your primary Cardiologist (physician) and Advanced Practice Providers (APPs -  Physician Assistants and Nurse Practitioners) who all work together to provide you with the care you need, when you need it.  We recommend signing up for the patient portal called "MyChart".  Sign up information is provided on this After Visit Summary.  MyChart is used to connect with patients for Virtual Visits (Telemedicine).  Patients are able to view lab/test results, encounter notes, upcoming appointments, etc.  Non-urgent messages can be sent to your provider as well.   To learn more about what you can do with MyChart, go to [email or web address].    Your next appointment:   Follow up will be determined after the above testing has been completed.  Thank you for choosing [facility]!!     I,Mathew Stumpf,acting as a scribe for Candee Furbish, MD.,have documented all  relevant documentation on the behalf of Candee Furbish, MD,as directed by  Candee Furbish, MD while in the presence of Candee Furbish, MD.  I, Candee Furbish, MD, have reviewed all documentation for this visit. The documentation on 06/20/21 for the exam, diagnosis, procedures, and orders are all accurate and complete.   Signed, Candee Furbish, MD  06/20/2021 11:55 AM    [facility]

## 2021-06-20 NOTE — Assessment & Plan Note (Addendum)
Has been treated for asthma and typically has to use her inhaler prior to exercise.  Seems to be worse in the heat of the summer.  Her mother has a history of diastolic heart failure.  She gets quite winded with exertion.  Go ahead and check an echocardiogram to ensure proper structure and function of her heart.  I will also check calcium score.  If plaque present given her family history, will consider the use of Crestor for her.

## 2021-06-20 NOTE — Assessment & Plan Note (Signed)
Medications reviewed Dr. Dema Severin has been on triamterene hydrochlorothiazide 37.5/25 mg

## 2021-06-21 DIAGNOSIS — F338 Other recurrent depressive disorders: Secondary | ICD-10-CM | POA: Diagnosis not present

## 2021-06-22 DIAGNOSIS — K5730 Diverticulosis of large intestine without perforation or abscess without bleeding: Secondary | ICD-10-CM | POA: Diagnosis not present

## 2021-06-22 DIAGNOSIS — Z1211 Encounter for screening for malignant neoplasm of colon: Secondary | ICD-10-CM | POA: Diagnosis not present

## 2021-07-06 DIAGNOSIS — F338 Other recurrent depressive disorders: Secondary | ICD-10-CM | POA: Diagnosis not present

## 2021-07-07 ENCOUNTER — Other Ambulatory Visit: Payer: Self-pay

## 2021-07-07 ENCOUNTER — Encounter (INDEPENDENT_AMBULATORY_CARE_PROVIDER_SITE_OTHER): Payer: Self-pay | Admitting: Adult Health

## 2021-07-07 ENCOUNTER — Ambulatory Visit (INDEPENDENT_AMBULATORY_CARE_PROVIDER_SITE_OTHER): Payer: BC Managed Care – PPO | Admitting: Adult Health

## 2021-07-07 VITALS — BP 118/82 | HR 81 | Temp 98.2°F | Ht 66.0 in | Wt 276.0 lb

## 2021-07-07 DIAGNOSIS — F5089 Other specified eating disorder: Secondary | ICD-10-CM

## 2021-07-07 DIAGNOSIS — F509 Eating disorder, unspecified: Secondary | ICD-10-CM | POA: Insufficient documentation

## 2021-07-07 DIAGNOSIS — I10 Essential (primary) hypertension: Secondary | ICD-10-CM | POA: Diagnosis not present

## 2021-07-07 DIAGNOSIS — Z6841 Body Mass Index (BMI) 40.0 and over, adult: Secondary | ICD-10-CM | POA: Diagnosis not present

## 2021-07-07 NOTE — Progress Notes (Signed)
Chief Complaint:   OBESITY Mary Morrison is here to discuss her progress with her obesity treatment plan along with follow-up of her obesity related diagnoses. Mary Morrison is on the Category 2 Plan and states she is following her eating plan approximately 50% of the time. Mary Morrison states she is not exercising at this time.  Today's visit was #: 4 Starting weight: 289 lbs Starting date: 04/01/2021 Today's weight: 276 lbs Today's date: 07/07/2021 Total lbs lost to date: 13 lbs Total lbs lost since last in-office visit: 2 lbs  Interim History:  Mary Morrison had an ED visit on 05/29/2021:  Patient with a history of obesity, sleep apnea, chronic lymphedema, hypertension, anxiety presenting with a 1 day history of right leg pain, redness and swelling.  States she woke up this morning with a red patch to her right anterior shin with some pain.  Pain progressed throughout the day and she noticed spreading redness involving her lower extremity up towards her calf and knee.  Has had warmth and erythema spreading up her leg.  Got her toenails done 2 days ago where she had a small cut.  Has had chills with no fever.  Temperature 100 on arrival.  Sent from urgent care with concern for DVT.  Has had cellulitis in the past of the left leg from open wound but no open wound in the right leg today.  Denies any history of DVT.  She is not anticoagulated.  No chest pain or shortness of breath.  Denies any history of diabetes. Since about 11 AM the redness has not changed.  Redness stops to her upper calf but she worries that it is getting close to her prosthetic knee joint Labs obtained in triage are reassuring.  Dopplers negative for DVT. Mild leukocytosis.  CRP and ESR elevated.  Blood culture sent.   Discussed with pharmacy if patient qualifies for Dalvance.  She does not want to be admitted Low suspicion for septic joint.  Plain film x-rays will be obtained to rule out soft tissue gas.   X-rays negative for soft tissue  gas.   Patient well-appearing and nontoxic.  She desires to go home and not be admitted.  Discussed with pharmacy and she shows qualify for Dalvance.   Dalvance given.  Borders of cellulitis marked.  Discussed PCP follow-up in 2 days with return to the ED sooner if redness spreads beyond marked borders, spreads past knee, intractable fevers at home, difficulty breathing or any other concerns. Final Clinical Impression(s) / ED Diagnoses Final diagnoses:  Cellulitis of right lower extremity    She has been traveling to/from the beach frequently - Mary Morrison.  Consuming Fast Food.  Mary Morrison for Disordered Eating - Mary Morrison (prescriber), Mary Morrison (therapist).   Subjective:   1. Essential hypertension BP/HR at goal at office visit. She is on losartan 50 mg daily and triamterene-HCTZ 37.5-25 mg daily.  2. Other disorder of eating [facility] for Disordered Eating - Mary Morrison (prescriber), Mary Morrison (therapist): Trazodone increased from 50 mg to 200 mg QHS. Lexapro increased from 10 mg to 20 mg daily. Maintained on bupropion XL 300 mg daily.  Assessment/Plan:   1. Essential hypertension Continue current antihypertensive regimen. Continue Category 2.  Increase daily walking.  Utilize Apple watch to track activity.  2. Other disorder of eating Continue with current [facility] care team.  3. Obesity with current BMI 44.6  Mary Morrison is currently in the action stage of change. As such, her goal is to  continue with weight loss efforts. She has agreed to the Category 2 Plan.   Exercise goals: No exercise has been prescribed at this time.  Behavioral modification strategies: increasing lean protein intake, decreasing simple carbohydrates, meal planning and cooking strategies, keeping healthy foods in the home, and planning for success.  Mary Morrison has agreed to follow-up with our clinic January 12 with Dr. Owens Shark. She was informed of the importance of  frequent follow-up visits to maximize her success with intensive lifestyle modifications for her multiple health conditions.   Objective:   Blood pressure 118/82, pulse 81, temperature 98.2 F (36.8 C), height '5\' 6"'  (1.676 m), weight 276 lb (125.2 kg), last menstrual period 02/10/2014, SpO2 98 %. Body mass index is 44.55 kg/m.  General: Cooperative, alert, well developed, in no acute distress. HEENT: Conjunctivae and lids unremarkable. Cardiovascular: Regular rhythm.  Lungs: Normal work of breathing. Neurologic: No focal deficits.   Lab Results  Component Value Date   CREATININE 0.90 05/29/2021   BUN 10 05/29/2021   NA 139 05/29/2021   K 3.8 05/29/2021   CL 101 05/29/2021   CO2 29 05/29/2021   Lab Results  Component Value Date   ALT 25 01/26/2021   AST 17 01/26/2021   ALKPHOS 56 01/26/2021   BILITOT 0.5 10/29/2020   Lab Results  Component Value Date   TSH 0.53 01/26/2021   Lab Results  Component Value Date   CHOL 176 01/26/2021   HDL 64 01/26/2021   LDLCALC 99 01/26/2021   TRIG 67 01/26/2021   CHOLHDL 3.5 09/23/2013   Lab Results  Component Value Date   VD25OH 31.16 02/05/2020   Lab Results  Component Value Date   WBC 7.7 05/29/2021   HGB 13.5 05/29/2021   HCT 40.6 05/29/2021   MCV 106.0 (H) 05/29/2021   PLT 183 05/29/2021   Attestation Statements:   Reviewed by clinician on day of visit: allergies, medications, problem list, medical history, surgical history, family history, social history, and previous encounter notes.  Time spent on visit including pre-visit chart review and post-visit care and charting was 35 minutes.   I, Water quality scientist, CMA, am acting as Location manager for Mina Marble, NP.  I have reviewed the above documentation for accuracy and completeness, and I agree with the above. -   d. , NP-C

## 2021-07-13 ENCOUNTER — Other Ambulatory Visit: Payer: Self-pay

## 2021-07-13 ENCOUNTER — Ambulatory Visit (HOSPITAL_COMMUNITY): Payer: BC Managed Care – PPO | Attending: Cardiology

## 2021-07-13 ENCOUNTER — Ambulatory Visit (INDEPENDENT_AMBULATORY_CARE_PROVIDER_SITE_OTHER)
Admission: RE | Admit: 2021-07-13 | Discharge: 2021-07-13 | Disposition: A | Discharge status: Home / Self Care | Payer: Self-pay | Source: Ambulatory Visit | Attending: Cardiology | Admitting: Cardiology

## 2021-07-13 DIAGNOSIS — I10 Essential (primary) hypertension: Secondary | ICD-10-CM

## 2021-07-13 DIAGNOSIS — R0609 Other forms of dyspnea: Secondary | ICD-10-CM

## 2021-07-13 LAB — ECHOCARDIOGRAM COMPLETE
Area-P 1/2: 5.27 cm2
P 1/2 time: 375 msec
S' Lateral: 3 cm

## 2021-07-15 ENCOUNTER — Telehealth: Payer: Self-pay | Admitting: Cardiology

## 2021-07-15 NOTE — Telephone Encounter (Signed)
Pt is returning call for results 

## 2021-07-15 NOTE — Telephone Encounter (Signed)
Left message to c/b for results.

## 2021-07-19 NOTE — Telephone Encounter (Signed)
Left message for pt to c/b to discuss recent results.

## 2021-07-29 ENCOUNTER — Encounter: Payer: Self-pay | Admitting: *Deleted

## 2021-07-29 NOTE — Telephone Encounter (Signed)
Letter of results mailed to pt's home address. (Echo and Ca score)

## 2021-08-01 ENCOUNTER — Telehealth: Payer: Self-pay | Admitting: *Deleted

## 2021-08-01 NOTE — Telephone Encounter (Signed)
Pt called in for her echo and Ca Score results.  Reviewed results with pt who states understanding. All questions, if any were answered at the time of the call.

## 2021-08-04 ENCOUNTER — Ambulatory Visit (INDEPENDENT_AMBULATORY_CARE_PROVIDER_SITE_OTHER): Payer: Managed Care, Other (non HMO) | Admitting: Bariatrics

## 2021-08-04 ENCOUNTER — Other Ambulatory Visit: Payer: Self-pay

## 2021-08-04 ENCOUNTER — Encounter (INDEPENDENT_AMBULATORY_CARE_PROVIDER_SITE_OTHER): Payer: Self-pay | Admitting: Bariatrics

## 2021-08-04 VITALS — BP 135/84 | HR 75 | Temp 98.0°F | Ht 66.0 in | Wt 270.0 lb

## 2021-08-04 DIAGNOSIS — R632 Polyphagia: Secondary | ICD-10-CM | POA: Diagnosis not present

## 2021-08-04 DIAGNOSIS — J4520 Mild intermittent asthma, uncomplicated: Secondary | ICD-10-CM | POA: Diagnosis not present

## 2021-08-04 DIAGNOSIS — Z6841 Body Mass Index (BMI) 40.0 and over, adult: Secondary | ICD-10-CM

## 2021-08-04 DIAGNOSIS — I10 Essential (primary) hypertension: Secondary | ICD-10-CM

## 2021-08-04 MED ORDER — WEGOVY 0.25 MG/0.5ML ~~LOC~~ SOAJ: 0.2500 mg | SUBCUTANEOUS | 0 refills | Status: DC

## 2021-08-04 NOTE — Progress Notes (Signed)
Chief Complaint:   OBESITY Mary Morrison is here to discuss her progress with her obesity treatment plan along with follow-up of her obesity related diagnoses. Mary Morrison is on the Category 2 Plan and states she is following her eating plan approximately 0% of the time. Mary Morrison states she is doing 0 minutes 0 times per week.  Today's visit was #: 5 Starting weight: 289 lbs Starting date: 04/01/2021 Today's weight: 270 lbs Today's date: 08/04/2021 Total lbs lost to date: 19 lbs Total lbs lost since last in-office visit: 6 lbs  Interim History: Mary Morrison is down an additional 6 lbs and doing well. She states that she is eating healthy.   Subjective:   1. Essential hypertension Mary Morrison's blood pressure is controlled.   2. Mild intermittent asthma, unspecified whether complicated Mary Morrison is using Albuterol.  3. Polyphagia Mary Morrison's appetite has increased especially at night.   Assessment/Plan:   1. Essential hypertension Mary Morrison will continue taking her medications. She is working on healthy weight loss and exercise to improve blood pressure control. We will watch for signs of hypotension as she continues her lifestyle modifications.  2. Mild intermittent asthma, unspecified whether complicated Mary Morrison will continue albuterol as needed for activity.   3. Polyphagia We will refill Wegovy 0.25 mg with no refills. Waiting on preauthorization. Intensive lifestyle modifications are the first line treatment for this issue. We discussed several lifestyle modifications today and she will continue to work on diet, exercise and weight loss efforts. Orders and follow up as documented in patient record.  Counseling Polyphagia is excessive hunger. Causes can include: low blood sugars, hypERthyroidism, PMS, lack of sleep, stress, insulin resistance, diabetes, certain medications, and diets that are deficient in protein and fiber.    - Semaglutide-Weight Management (WEGOVY) 0.25 MG/0.5ML SOAJ; Inject  0.25 mg into the skin once a week.  Dispense: 2 mL; Refill: 0  4. Obesity with current BMI 43.6 Mary Morrison is currently in the action stage of change. As such, her goal is to continue with weight loss efforts. She has agreed to the Category 2 Plan.   Mary Morrison will continue meal planning and she will continue intentional eating.   Exercise goals: Mary Morrison is really active and closing her rings on her I -phone   Behavioral modification strategies: increasing lean protein intake, decreasing simple carbohydrates, increasing vegetables, increasing water intake, decreasing eating out, no skipping meals, meal planning and cooking strategies, keeping healthy foods in the home, and planning for success.  Mary Morrison has agreed to follow-up with our clinic in 2 weeks. She was informed of the importance of frequent follow-up visits to maximize her success with intensive lifestyle modifications for her multiple health conditions.   Objective:   Blood pressure 135/84, pulse 75, temperature 98 F (36.7 C), height 5\' 6"  (1.676 m), weight 270 lb (122.5 kg), last menstrual period 02/10/2014, SpO2 97 %. Body mass index is 43.58 kg/m.  General: Cooperative, alert, well developed, in no acute distress. HEENT: Conjunctivae and lids unremarkable. Cardiovascular: Regular rhythm.  Lungs: Normal work of breathing. Neurologic: No focal deficits.   Lab Results  Component Value Date   CREATININE 0.90 05/29/2021   BUN 10 05/29/2021   NA 139 05/29/2021   K 3.8 05/29/2021   CL 101 05/29/2021   CO2 29 05/29/2021   Lab Results  Component Value Date   ALT 25 01/26/2021   AST 17 01/26/2021   ALKPHOS 56 01/26/2021   BILITOT 0.5 10/29/2020   No results found for: HGBA1C No results found  for: INSULIN Lab Results  Component Value Date   TSH 0.53 01/26/2021   Lab Results  Component Value Date   CHOL 176 01/26/2021   HDL 64 01/26/2021   LDLCALC 99 01/26/2021   TRIG 67 01/26/2021   CHOLHDL 3.5 09/23/2013   Lab  Results  Component Value Date   VD25OH 31.16 02/05/2020   Lab Results  Component Value Date   WBC 7.7 05/29/2021   HGB 13.5 05/29/2021   HCT 40.6 05/29/2021   MCV 106.0 (H) 05/29/2021   PLT 183 05/29/2021   No results found for: IRON, TIBC, FERRITIN  Attestation Statements:   Reviewed by clinician on day of visit: allergies, medications, problem list, medical history, surgical history, family history, social history, and previous encounter notes.  I, Lizbeth Bark, RMA, am acting as Location manager for [employer], DO.  I have reviewed the above documentation for accuracy and completeness, and I agree with the above. Jearld Lesch, DO

## 2021-08-08 ENCOUNTER — Encounter (INDEPENDENT_AMBULATORY_CARE_PROVIDER_SITE_OTHER): Payer: Self-pay | Admitting: Bariatrics

## 2021-08-18 ENCOUNTER — Ambulatory Visit (INDEPENDENT_AMBULATORY_CARE_PROVIDER_SITE_OTHER): Payer: Managed Care, Other (non HMO) | Admitting: Bariatrics

## 2021-08-18 ENCOUNTER — Encounter (INDEPENDENT_AMBULATORY_CARE_PROVIDER_SITE_OTHER): Payer: Self-pay | Admitting: Bariatrics

## 2021-08-18 ENCOUNTER — Other Ambulatory Visit: Payer: Self-pay

## 2021-08-18 VITALS — BP 124/85 | HR 90 | Temp 97.6°F | Ht 66.0 in | Wt 273.0 lb

## 2021-08-18 DIAGNOSIS — Z6841 Body Mass Index (BMI) 40.0 and over, adult: Secondary | ICD-10-CM

## 2021-08-18 DIAGNOSIS — R632 Polyphagia: Secondary | ICD-10-CM | POA: Diagnosis not present

## 2021-08-18 DIAGNOSIS — E669 Obesity, unspecified: Secondary | ICD-10-CM

## 2021-08-18 DIAGNOSIS — I10 Essential (primary) hypertension: Secondary | ICD-10-CM | POA: Diagnosis not present

## 2021-08-18 NOTE — Progress Notes (Signed)
Chief Complaint:   OBESITY Mary Morrison is here to discuss her progress with her obesity treatment plan along with follow-up of her obesity related diagnoses. Mary Morrison is on the Category 2 Plan and states she is following her eating plan approximately 75% of the time. Mary Morrison states she is working around the house.  Today's visit was #: 6 Starting weight: 289 lbs Starting date: 04/01/2021 Today's weight: 273 lbs Today's date: 08/18/2021 Total lbs lost to date: 16 lbs Total lbs lost since last in-office visit: 0  Interim History: Mary Morrison is up 3 lbs since her last visit. Her goal is to get below 199 lbs.  Subjective:   1. Essential hypertension Phillipa is taking Cozaar and Maxzide. Her blood pressure is controlled. Her last blood pressure was 135/84.  2. Polyphagia Mary Morrison was prescribed Wegovy but it was too expensive and she did not start  [facility].  Assessment/Plan:   1. Essential hypertension Chantilly will continue her medication. She will have no salt added. She is working on healthy weight loss and exercise to improve blood pressure control. We will watch for signs of hypotension as she continues her lifestyle modifications.  2. Polyphagia Sophia will contact her insurance company to see what they will pay. Intensive lifestyle modifications are the first line treatment for this issue. We discussed several lifestyle modifications today and she will continue to work on diet, exercise and weight loss efforts. Orders and follow up as documented in patient record.  Counseling Polyphagia is excessive hunger. Causes can include: low blood sugars, hypERthyroidism, PMS, lack of sleep, stress, insulin resistance, diabetes, certain medications, and diets that are deficient in protein and fiber.    3. Obesity with current BMI 44.1 Mary Morrison is currently in the action stage of change. As such, her goal is to continue with weight loss efforts. She has agreed to the Category 2 Plan.   Mary Morrison  will continue meal planning and she will continue intentional eating.  Exercise goals:  As is.  Behavioral modification strategies: increasing lean protein intake, decreasing simple carbohydrates, increasing vegetables, increasing water intake, decreasing eating out, no skipping meals, meal planning and cooking strategies, keeping healthy foods in the home, and planning for success.  Mary Morrison has agreed to follow-up with our clinic in 4 weeks. She was informed of the importance of frequent follow-up visits to maximize her success with intensive lifestyle modifications for her multiple health conditions.   Objective:   Blood pressure 124/85, pulse 90, temperature 97.6 F (36.4 C), height 5\' 6"  (1.676 m), weight 273 lb (123.8 kg), last menstrual period 02/10/2014, SpO2 95 %. Body mass index is 44.06 kg/m.  General: Cooperative, alert, well developed, in no acute distress. HEENT: Conjunctivae and lids unremarkable. Cardiovascular: Regular rhythm.  Lungs: Normal work of breathing. Neurologic: No focal deficits.   Lab Results  Component Value Date   CREATININE 0.90 05/29/2021   BUN 10 05/29/2021   NA 139 05/29/2021   K 3.8 05/29/2021   CL 101 05/29/2021   CO2 29 05/29/2021   Lab Results  Component Value Date   ALT 25 01/26/2021   AST 17 01/26/2021   ALKPHOS 56 01/26/2021   BILITOT 0.5 10/29/2020   No results found for: HGBA1C No results found for: INSULIN Lab Results  Component Value Date   TSH 0.53 01/26/2021   Lab Results  Component Value Date   CHOL 176 01/26/2021   HDL 64 01/26/2021   LDLCALC 99 01/26/2021   TRIG 67 01/26/2021   CHOLHDL  3.5 09/23/2013   Lab Results  Component Value Date   VD25OH 31.16 02/05/2020   Lab Results  Component Value Date   WBC 7.7 05/29/2021   HGB 13.5 05/29/2021   HCT 40.6 05/29/2021   MCV 106.0 (H) 05/29/2021   PLT 183 05/29/2021   No results found for: IRON, TIBC, FERRITIN  Attestation Statements:   Reviewed by clinician  on day of visit: allergies, medications, problem list, medical history, surgical history, family history, social history, and previous encounter notes.  I, Lizbeth Bark, RMA, am acting as Location manager for [employer], DO.  I have reviewed the above documentation for accuracy and completeness, and I agree with the above. Jearld Lesch, DO

## 2021-08-22 ENCOUNTER — Encounter (INDEPENDENT_AMBULATORY_CARE_PROVIDER_SITE_OTHER): Payer: Self-pay | Admitting: Bariatrics

## 2021-09-19 ENCOUNTER — Other Ambulatory Visit: Payer: Self-pay

## 2021-09-19 ENCOUNTER — Encounter (INDEPENDENT_AMBULATORY_CARE_PROVIDER_SITE_OTHER): Payer: Self-pay | Admitting: Bariatrics

## 2021-09-19 ENCOUNTER — Ambulatory Visit (INDEPENDENT_AMBULATORY_CARE_PROVIDER_SITE_OTHER): Payer: Managed Care, Other (non HMO) | Admitting: Bariatrics

## 2021-09-19 VITALS — BP 136/91 | HR 97 | Temp 98.1°F | Ht 66.0 in | Wt 283.0 lb

## 2021-09-19 DIAGNOSIS — Z6841 Body Mass Index (BMI) 40.0 and over, adult: Secondary | ICD-10-CM | POA: Diagnosis not present

## 2021-09-19 DIAGNOSIS — I10 Essential (primary) hypertension: Secondary | ICD-10-CM

## 2021-09-19 DIAGNOSIS — R632 Polyphagia: Secondary | ICD-10-CM

## 2021-09-19 DIAGNOSIS — E669 Obesity, unspecified: Secondary | ICD-10-CM

## 2021-09-19 NOTE — Progress Notes (Signed)
Chief Complaint:   OBESITY Mary Morrison is here to discuss her progress with her obesity treatment plan along with follow-up of her obesity related diagnoses. Mary Morrison is on the Category 2 Plan and states she is following her eating plan approximately 0% of the time. Mary Morrison states she is walking for 15-30 minutes 5 times per week.  Today's visit was #: 7 Starting weight: 289 lbs Starting date: 04/01/2021 Today's weight: 283 lbs Today's date: 09/19/2021 Total lbs lost to date: 6 lbs Total lbs lost since last in-office visit: 0  Interim History: Mary Morrison is up 10 lbs after being on a cruise. She is still craving sweets and carbohydrates after the cruise. She is up about 8 lbs of water per the bioimpedance scale.   Subjective:   1. Polyphagia Mary Morrison was prescribed Wegovy but was unable to afford it.   2. Essential hypertension Mary Morrison is currently taking Cozaar and Maxzide-25.  Assessment/Plan:   1. Polyphagia Intensive lifestyle modifications are the first line treatment for this issue. Mary Morrison will get Wegovy through [facility] and [facility] in [place]. We discussed several lifestyle modifications today and she will continue to work on diet, exercise and weight loss efforts. Orders and follow up as documented in patient record.  Counseling Polyphagia is excessive hunger. Causes can include: low blood sugars, hypERthyroidism, PMS, lack of sleep, stress, insulin resistance, diabetes, certain medications, and diets that are deficient in protein and fiber.    2. Essential hypertension Mary Morrison will continue taking Cozaar and Maxzide-25. She is working on healthy weight loss and exercise to improve blood pressure control. We will watch for signs of hypotension as she continues her lifestyle modifications.  3. Obesity with current BMI 45.8 Mary Morrison is currently in the action stage of change. As such, her goal is to continue with weight loss efforts. She has agreed to the Category  2 Plan.   Mary Morrison will continue meal planning and she will continue intentional eating. She will continue journaling.  Exercise goals:  As is.  Behavioral modification strategies: increasing lean protein intake, decreasing simple carbohydrates, increasing vegetables, increasing water intake, decreasing eating out, no skipping meals, meal planning and cooking strategies, keeping healthy foods in the home, and planning for success.  Mary Morrison has agreed to follow-up with our clinic in 2 weeks. She was informed of the importance of frequent follow-up visits to maximize her success with intensive lifestyle modifications for her multiple health conditions.   Objective:   Blood pressure (!) 136/91, pulse 97, temperature 98.1 F (36.7 C), height 5\' 6"  (1.676 m), weight 283 lb (128.4 kg), last menstrual period 02/10/2014, SpO2 99 %. Body mass index is 45.68 kg/m.  General: Cooperative, alert, well developed, in no acute distress. HEENT: Conjunctivae and lids unremarkable. Cardiovascular: Regular rhythm.  Lungs: Normal work of breathing. Neurologic: No focal deficits.   Lab Results  Component Value Date   CREATININE 0.90 05/29/2021   BUN 10 05/29/2021   NA 139 05/29/2021   K 3.8 05/29/2021   CL 101 05/29/2021   CO2 29 05/29/2021   Lab Results  Component Value Date   ALT 25 01/26/2021   AST 17 01/26/2021   ALKPHOS 56 01/26/2021   BILITOT 0.5 10/29/2020   No results found for: HGBA1C No results found for: INSULIN Lab Results  Component Value Date   TSH 0.53 01/26/2021   Lab Results  Component Value Date   CHOL 176 01/26/2021   HDL 64 01/26/2021   LDLCALC 99 01/26/2021  TRIG 67 01/26/2021   CHOLHDL 3.5 09/23/2013   Lab Results  Component Value Date   VD25OH 31.16 02/05/2020   Lab Results  Component Value Date   WBC 7.7 05/29/2021   HGB 13.5 05/29/2021   HCT 40.6 05/29/2021   MCV 106.0 (H) 05/29/2021   PLT 183 05/29/2021   No results found for: IRON, TIBC,  FERRITIN  Attestation Statements:   Reviewed by clinician on day of visit: allergies, medications, problem list, medical history, surgical history, family history, social history, and previous encounter notes.  I, Lizbeth Bark, RMA, am acting as Location manager for [employer], DO.  I have reviewed the above documentation for accuracy and completeness, and I agree with the above. Jearld Lesch, DO

## 2021-10-04 ENCOUNTER — Ambulatory Visit (INDEPENDENT_AMBULATORY_CARE_PROVIDER_SITE_OTHER): Payer: Managed Care, Other (non HMO) | Admitting: Family Medicine

## 2021-10-31 ENCOUNTER — Encounter (INDEPENDENT_AMBULATORY_CARE_PROVIDER_SITE_OTHER): Payer: Self-pay | Admitting: Bariatrics

## 2021-10-31 ENCOUNTER — Ambulatory Visit (INDEPENDENT_AMBULATORY_CARE_PROVIDER_SITE_OTHER): Payer: Managed Care, Other (non HMO) | Admitting: Bariatrics

## 2021-10-31 VITALS — BP 140/84 | HR 77 | Temp 97.7°F | Ht 66.0 in | Wt 294.0 lb

## 2021-10-31 DIAGNOSIS — I10 Essential (primary) hypertension: Secondary | ICD-10-CM | POA: Diagnosis not present

## 2021-10-31 DIAGNOSIS — F5089 Other specified eating disorder: Secondary | ICD-10-CM | POA: Diagnosis not present

## 2021-10-31 DIAGNOSIS — Z6841 Body Mass Index (BMI) 40.0 and over, adult: Secondary | ICD-10-CM | POA: Diagnosis not present

## 2021-10-31 DIAGNOSIS — E669 Obesity, unspecified: Secondary | ICD-10-CM

## 2021-11-01 NOTE — Progress Notes (Signed)
? ? ? ?Chief Complaint:  ? ?OBESITY ?Cova is here to discuss her progress with her obesity treatment plan along with follow-up of her obesity related diagnoses. Zeniyah is on the Category 2 Plan and states she is following her eating plan approximately 50% of the time. Daylan states she is walking for 0 minutes 0 times per week. ? ?Today's visit was #: 8 ?Starting weight: 289 lbs ?Starting date: 04/01/2021 ?Today's weight: 294 lbs ?Today's date: 10/31/2021 ?Total lbs lost to date: 0 ?Total lbs lost since last in-office visit: 0 ? ?Interim History: Shereta is up 9 lbs since her last visit. She states that she is eating "her emotions".  ? ?Subjective:  ? ?1. Essential hypertension ?Sevana is taking Maxzide and Cozaar. Her blood pressure is reasonably well controlled 140/84.  ? ?2. Other disorder of eating ?Zamiyah is currently taking Wellbutrin and Lexapro.  ? ?Assessment/Plan:  ? ?1. Essential hypertension ?Keyonda will continue Maxzide and Cozaar. She is working on healthy weight loss and exercise to improve blood pressure control. We will watch for signs of hypotension as she continues her lifestyle modifications. ? ?2. Other disorder of eating ?Alayja will continue medications. Referral to Dr. Mallie Mussel our Bariatric Psychologist today. She will continue with psychologist. She will continue to work on weight loss, exercise, and decreasing simple carbohydrates to help decrease the risk of diabetes.  ? ?3. Obesity with current BMI 45.8 ?Janeva is currently in the action stage of change. As such, her goal is to continue with weight loss efforts. She has agreed to the Category 2 Plan.  ? ?Ahlani will continue meal planning and she will continue intentional eating. Referral to [facility] was placed.  ? ?Exercise goals:  As is.  ? ?Behavioral modification strategies: increasing lean protein intake, decreasing simple carbohydrates, increasing vegetables, increasing water intake, decreasing eating out, no  skipping meals, meal planning and cooking strategies, keeping healthy foods in the home, and planning for success. ? ?Sophiamarie has agreed to follow-up with our clinic in 4-5 weeks with nurse practitioner and 8 weeks with myself (fasting). She was informed of the importance of frequent follow-up visits to maximize her success with intensive lifestyle modifications for her multiple health conditions.  ? ?Objective:  ? ?Blood pressure 140/84, pulse 77, temperature 97.7 ?F (36.5 ?C), height '5\' 6"'$  (1.676 m), weight 294 lb (133.4 kg), last menstrual period 02/10/2014, SpO2 96 %. ?Body mass index is 47.45 kg/m?. ? ?General: Cooperative, alert, well developed, in no acute distress. ?HEENT: Conjunctivae and lids unremarkable. ?Cardiovascular: Regular rhythm.  ?Lungs: Normal work of breathing. ?Neurologic: No focal deficits.  ? ?Lab Results  ?Component Value Date  ? CREATININE 0.90 05/29/2021  ? BUN 10 05/29/2021  ? NA 139 05/29/2021  ? K 3.8 05/29/2021  ? CL 101 05/29/2021  ? CO2 29 05/29/2021  ? ?Lab Results  ?Component Value Date  ? ALT 25 01/26/2021  ? AST 17 01/26/2021  ? ALKPHOS 56 01/26/2021  ? BILITOT 0.5 10/29/2020  ? ?No results found for: HGBA1C ?No results found for: INSULIN ?Lab Results  ?Component Value Date  ? TSH 0.53 01/26/2021  ? ?Lab Results  ?Component Value Date  ? CHOL 176 01/26/2021  ? HDL 64 01/26/2021  ? [place] 99 01/26/2021  ? TRIG 67 01/26/2021  ? CHOLHDL 3.5 09/23/2013  ? ?Lab Results  ?Component Value Date  ? VD25OH 31.16 02/05/2020  ? ?Lab Results  ?Component Value Date  ? WBC 7.7 05/29/2021  ? HGB 13.5 05/29/2021  ?  HCT 40.6 05/29/2021  ? MCV 106.0 (H) 05/29/2021  ? PLT 183 05/29/2021  ? ?No results found for: IRON, TIBC, FERRITIN ? ?Attestation Statements:  ? ?Reviewed by clinician on day of visit: allergies, medications, problem list, medical history, surgical history, family history, social history, and previous encounter notes. ? ?I, Lizbeth Bark, RMA, am acting as transcriptionist for [employer], DO. ? ?I have reviewed the above documentation for accuracy and completeness, and I agree with the above. Jearld Lesch, DO ? ?

## 2021-11-28 ENCOUNTER — Ambulatory Visit (INDEPENDENT_AMBULATORY_CARE_PROVIDER_SITE_OTHER): Payer: Commercial Managed Care - HMO | Admitting: Nurse Practitioner

## 2021-11-28 ENCOUNTER — Encounter (INDEPENDENT_AMBULATORY_CARE_PROVIDER_SITE_OTHER): Payer: Self-pay | Admitting: Nurse Practitioner

## 2021-11-28 VITALS — BP 119/82 | HR 70 | Temp 97.2°F | Ht 66.0 in | Wt 292.0 lb

## 2021-11-28 DIAGNOSIS — R632 Polyphagia: Secondary | ICD-10-CM | POA: Diagnosis not present

## 2021-11-28 DIAGNOSIS — I10 Essential (primary) hypertension: Secondary | ICD-10-CM

## 2021-11-28 DIAGNOSIS — G4733 Obstructive sleep apnea (adult) (pediatric): Secondary | ICD-10-CM

## 2021-11-28 DIAGNOSIS — E669 Obesity, unspecified: Secondary | ICD-10-CM

## 2021-11-28 DIAGNOSIS — Z6841 Body Mass Index (BMI) 40.0 and over, adult: Secondary | ICD-10-CM

## 2021-11-28 MED ORDER — WEGOVY 0.25 MG/0.5ML ~~LOC~~ SOAJ: 0.2500 mg | SUBCUTANEOUS | 0 refills | Status: DC

## 2021-11-30 NOTE — Progress Notes (Signed)
? ? ? ?Chief Complaint:  ? ?OBESITY ?Mary Morrison is here to discuss her progress with her obesity treatment plan along with follow-up of her obesity related diagnoses. Mary Morrison is on the Category 2 Plan and states she is following her eating plan approximately 30% of the time. Mary Morrison states she is housework and 5,000 steps daily  5 times per week. ? ?Today's visit was #: 9 ?Starting weight: 289 lbs ?Starting date: 04/01/2021 ?Today's weight: 292 lbs ?Today's date: 11/28/2021 ?Total lbs lost to date: 0 ?Total lbs lost since last in-office visit: 2 lbs ? ?Interim History: Mary Morrison has done well with weight loss since her last visit. She travels frequently. She doesn't like to cook and has been ordering from a meal prep company. She is making a home gym and is hiring a Physiological scientist. She is drinking water, Coke zero, Sparkling water. She took [facility], last dose, last Monday.  ? ?Subjective:  ? ?1. Obstructive sleep apnea syndrome ?Mary Morrison started using CPAP regularly March - April. She reports feeling better and having more energy.  ? ?2. Essential hypertension ?Mary Morrison is currently taking Maxzide and Cozaar. She denies side effects. Her blood pressure looks better today. She denies chest pain, shortness of breath, and palpitations.  ? ?3. Polyphagia ?Mary Morrison is taking Wegovy 0.25 mg. She is concerned about continuing due to cost.  ? ?Assessment/Plan:  ? ?1. Obstructive sleep apnea syndrome ?Mary Morrison will continue CPAP nightly. Intensive lifestyle modifications are the first line treatment for this issue. We discussed several lifestyle modifications today and she will continue to work on diet, exercise and weight loss efforts. We will continue to monitor. Orders and follow up as documented in patient record.   ? ?2. Essential hypertension ?Mary Morrison will continue to follow up with her primary care physician and she will continue medications as directed. She is working on healthy weight loss and exercise to improve blood  pressure control. We will watch for signs of hypotension as she continues her lifestyle modifications. ? ?3. Polyphagia ?Intensive lifestyle modifications are the first line treatment for this issue. We will do a prior authorization for [facility].  discussed several lifestyle modifications today and she will continue to work on diet, exercise and weight loss efforts. Orders and follow up as documented in patient record. ? ?Counseling ?Polyphagia is excessive hunger. ?Causes can include: low blood sugars, hypERthyroidism, PMS, lack of sleep, stress, insulin resistance, diabetes, certain medications, and diets that are deficient in protein and fiber.   ? ?4. Obesity with current BMI 47.1 ?Mary Morrison is currently in the action stage of change. As such, her goal is to continue with weight loss efforts. She has agreed to the Category 2 Plan.  ? ?Mary Morrison will continue Wegovy 0.25 mg for month with no refills. Will start PA.   ? ?- Semaglutide-Weight Management (WEGOVY) 0.25 MG/0.5ML SOAJ; Inject 0.25 mg into the skin once a week.  Dispense: 2 mL; Refill: 0 ? ?Exercise goals:  As is.  ? ?Behavioral modification strategies: increasing lean protein intake, increasing water intake, no skipping meals, and meal planning and cooking strategies. ? ?Mary Morrison has agreed to follow-up with our clinic in 2 weeks. She was informed of the importance of frequent follow-up visits to maximize her success with intensive lifestyle modifications for her multiple health conditions.  ? ?Objective:  ? ?Blood pressure 119/82, pulse 70, temperature (!) 97.2 ?F (36.2 ?C), height '5\' 6"'$  (1.676 m), weight 292 lb (132.5 kg), last menstrual period 02/10/2014, SpO2 97 %. ?Body mass index is  47.13 kg/m?. ? ?General: Cooperative, alert, well developed, in no acute distress. ?HEENT: Conjunctivae and lids unremarkable. ?Cardiovascular: Regular rhythm.  ?Lungs: Normal work of breathing. ?Neurologic: No focal deficits.  ? ?Lab Results  ?Component Value Date  ?  CREATININE 0.90 05/29/2021  ? BUN 10 05/29/2021  ? NA 139 05/29/2021  ? K 3.8 05/29/2021  ? CL 101 05/29/2021  ? CO2 29 05/29/2021  ? ?Lab Results  ?Component Value Date  ? ALT 25 01/26/2021  ? AST 17 01/26/2021  ? ALKPHOS 56 01/26/2021  ? BILITOT 0.5 10/29/2020  ? ?No results found for: HGBA1C ?No results found for: INSULIN ?Lab Results  ?Component Value Date  ? TSH 0.53 01/26/2021  ? ?Lab Results  ?Component Value Date  ? CHOL 176 01/26/2021  ? HDL 64 01/26/2021  ? [place] 99 01/26/2021  ? TRIG 67 01/26/2021  ? CHOLHDL 3.5 09/23/2013  ? ?Lab Results  ?Component Value Date  ? VD25OH 31.16 02/05/2020  ? ?Lab Results  ?Component Value Date  ? WBC 7.7 05/29/2021  ? HGB 13.5 05/29/2021  ? HCT 40.6 05/29/2021  ? MCV 106.0 (H) 05/29/2021  ? PLT 183 05/29/2021  ? ?No results found for: IRON, TIBC, FERRITIN ? ?Attestation Statements:  ? ?Reviewed by clinician on day of visit: allergies, medications, problem list, medical history, surgical history, family history, social history, and previous encounter notes. ? ?I, Lizbeth Bark, RMA, am acting as Location manager for [employer], FNP. ? ?I have reviewed the above documentation for accuracy and completeness, and I agree with the above. [employer], FNP  ?

## 2021-12-14 ENCOUNTER — Encounter (INDEPENDENT_AMBULATORY_CARE_PROVIDER_SITE_OTHER): Payer: Self-pay | Admitting: Nurse Practitioner

## 2021-12-14 ENCOUNTER — Telehealth (INDEPENDENT_AMBULATORY_CARE_PROVIDER_SITE_OTHER): Payer: Self-pay

## 2021-12-14 ENCOUNTER — Ambulatory Visit (INDEPENDENT_AMBULATORY_CARE_PROVIDER_SITE_OTHER): Payer: Commercial Managed Care - HMO | Admitting: Nurse Practitioner

## 2021-12-14 VITALS — BP 126/75 | HR 76 | Temp 98.1°F | Ht 66.0 in | Wt 288.0 lb

## 2021-12-14 DIAGNOSIS — Z6841 Body Mass Index (BMI) 40.0 and over, adult: Secondary | ICD-10-CM

## 2021-12-14 DIAGNOSIS — E669 Obesity, unspecified: Secondary | ICD-10-CM

## 2021-12-14 DIAGNOSIS — R632 Polyphagia: Secondary | ICD-10-CM | POA: Diagnosis not present

## 2021-12-14 NOTE — Telephone Encounter (Signed)
Prior authorization started for [facility] by phone with [place].  Covermymeds response was patient inactive. I called and waited on hold 45 minutes before a representative  answered. Prior authorization started by phone. If any additional information is needed, they will fax the request.  Case # [id number]. As a reminder Christella Scheuermann takes 3-4 weeks to get a response back. As per Christella Scheuermann, they are behind.

## 2021-12-14 NOTE — Telephone Encounter (Signed)
Possibly needs PA for [facility].

## 2021-12-15 ENCOUNTER — Encounter (INDEPENDENT_AMBULATORY_CARE_PROVIDER_SITE_OTHER): Payer: Self-pay

## 2021-12-15 ENCOUNTER — Telehealth (INDEPENDENT_AMBULATORY_CARE_PROVIDER_SITE_OTHER): Payer: Self-pay | Admitting: Nurse Practitioner

## 2021-12-15 NOTE — Telephone Encounter (Signed)
Mary Morrison - Prior authorization denied for [employer]. Per insurance - plan exclusion/not a covered benefit. Patient sent denial message via mychart.

## 2021-12-26 ENCOUNTER — Encounter (INDEPENDENT_AMBULATORY_CARE_PROVIDER_SITE_OTHER): Payer: Self-pay | Admitting: Bariatrics

## 2021-12-26 ENCOUNTER — Ambulatory Visit (INDEPENDENT_AMBULATORY_CARE_PROVIDER_SITE_OTHER): Payer: Commercial Managed Care - HMO | Admitting: Bariatrics

## 2021-12-26 VITALS — BP 133/84 | HR 88 | Ht 66.0 in | Wt 290.0 lb

## 2021-12-26 DIAGNOSIS — E669 Obesity, unspecified: Secondary | ICD-10-CM | POA: Diagnosis not present

## 2021-12-26 DIAGNOSIS — Z6841 Body Mass Index (BMI) 40.0 and over, adult: Secondary | ICD-10-CM

## 2021-12-26 DIAGNOSIS — F5089 Other specified eating disorder: Secondary | ICD-10-CM

## 2021-12-26 DIAGNOSIS — I10 Essential (primary) hypertension: Secondary | ICD-10-CM

## 2021-12-26 NOTE — Progress Notes (Unsigned)
Chief Complaint:   OBESITY Mary Morrison is here to discuss her progress with her obesity treatment plan along with follow-up of her obesity related diagnoses. Ronnie is on the Category 2 Plan and states she is following her eating plan approximately 50% of the time. Cyera states she is walking for 45 minutes 4 times per week.  Today's visit was #: 10 Starting weight: 289 lbs Starting date: 04/01/2021 Today's weight: 288 lbs Today's date: 12/14/2021 Total lbs lost to date: 1 lb Total lbs lost since last in-office visit: 4 lbs  Interim History: Allysson has done well since her last visit. She went to the beach since her last visit. She is leaving for a cruise Saturday. She plans to go the gym daily while on the cruise. She never refilled Wegovy due to cost. She waiting on pre-authorization.   Subjective:   1. Polyphagia [name] has taken Wegovy in the past and stopped due cost. I refilled Wegovy after her last visit. She is still waiting on pre-authorization.    Assessment/Plan:   1. Polyphagia Intensive lifestyle modifications are the first line treatment for this issue. [name] is waiting on pre-authorization. We will discuss again at next visit. We discussed several lifestyle modifications today and she will continue to work on diet, exercise and weight loss efforts. Orders and follow up as documented in patient record.  Counseling Polyphagia is excessive hunger. Causes can include: low blood sugars, hypERthyroidism, PMS, lack of sleep, stress, insulin resistance, diabetes, certain medications, and diets that are deficient in protein and fiber.    2. Obesity with current BMI 46.5 [name] is currently in the action stage of change. As such, her goal is to continue with weight loss efforts. She has agreed to practicing portion control and making smarter food choices, such as increasing vegetables and decreasing simple carbohydrates.   Exercise goals:  As is.   Behavioral  modification strategies: increasing lean protein intake, increasing water intake, and travel eating strategies.  [name] has agreed to follow-up with our clinic in 2 weeks. She was informed of the importance of frequent follow-up visits to maximize her success with intensive lifestyle modifications for her multiple health conditions.   Objective:   Blood pressure 126/75, pulse 76, temperature 98.1 F (36.7 C), height '5\' 6"'$  (1.676 m), weight 288 lb (130.6 kg), last menstrual period 02/10/2014, SpO2 97 %. Body mass index is 46.48 kg/m.  General: Cooperative, alert, well developed, in no acute distress. HEENT: Conjunctivae and lids unremarkable. Cardiovascular: Regular rhythm.  Lungs: Normal work of breathing. Neurologic: No focal deficits.   Lab Results  Component Value Date   CREATININE 0.90 05/29/2021   BUN 10 05/29/2021   NA 139 05/29/2021   K 3.8 05/29/2021   CL 101 05/29/2021   CO2 29 05/29/2021   Lab Results  Component Value Date   ALT 25 01/26/2021   AST 17 01/26/2021   ALKPHOS 56 01/26/2021   BILITOT 0.5 10/29/2020   No results found for: HGBA1C No results found for: INSULIN Lab Results  Component Value Date   TSH 0.53 01/26/2021   Lab Results  Component Value Date   CHOL 176 01/26/2021   HDL 64 01/26/2021   LDLCALC 99 01/26/2021   TRIG 67 01/26/2021   CHOLHDL 3.5 09/23/2013   Lab Results  Component Value Date   VD25OH 31.16 02/05/2020   Lab Results  Component Value Date   WBC 7.7 05/29/2021   HGB 13.5 05/29/2021   HCT 40.6 05/29/2021  MCV 106.0 (H) 05/29/2021   PLT 183 05/29/2021   No results found for: IRON, TIBC, FERRITIN  Attestation Statements:   Reviewed by clinician on day of visit: allergies, medications, problem list, medical history, surgical history, family history, social history, and previous encounter notes.  I, Lizbeth Bark, RMA, am acting as Location manager for Everardo Pacific, FNP.  I have reviewed the above documentation  for accuracy and completeness, and I agree with the above. Everardo Pacific, FNP

## 2021-12-27 NOTE — Progress Notes (Signed)
Chief Complaint:   OBESITY Mary Morrison is here to discuss her progress with her obesity treatment plan along with follow-up of her obesity related diagnoses. Mary Morrison is on the Category 2 Plan and states she is following her eating plan approximately 50% of the time. Mary Morrison states she is walking for 30-45 minutes 5 times per week.  Today's visit was #: 11 Starting weight: 289 lbs Starting date: 04/01/2021 Today's weight: 290 lbs Today's date: 12/26/2021 Total lbs lost to date: 0 Total lbs lost since last in-office visit: 0  Interim History: Mary Morrison is up 2 lbs since her last visit. Her insurance will not cover Wegovy. She has been on a cruise but made better choices.   Subjective:   1. Essential hypertension Mary Morrison's blood pressure is controlled. Her blood pressure today was  133/84.  2. Other disorder of eating Mary Morrison is currently taking Wellbutrin.   Assessment/Plan:   1. Essential hypertension Mary Morrison will continue taking medications. She is working on healthy weight loss and exercise to improve blood pressure control. We will watch for signs of hypotension as she continues her lifestyle modifications.  2. Other disorder of eating Mary Morrison will continue Wellbutrin. Distraction techniques and emotional eating discussed today. Behavior modification techniques were discussed today to help Mary Morrison deal with her emotional/non-hunger eating behaviors.  Orders and follow up as documented in patient record.   3. Obesity with current BMI 46.9 Mary Morrison is currently in the action stage of change. As such, her goal is to continue with weight loss efforts. She has agreed to the Category 2 Plan.   Mary Morrison will continue meal planning and she will continue intentional eating. She will get back on the plan 80-90%.   Exercise goals:  As is. Mary Morrison will continue exercising at home 15 minutes a day.   Behavioral modification strategies: increasing lean protein intake, decreasing simple  carbohydrates, increasing vegetables, increasing water intake, decreasing eating out, no skipping meals, meal planning and cooking strategies, keeping healthy foods in the home, and planning for success.  Mary Morrison has agreed to follow-up with our clinic in 4-5 weeks (fasting). She was informed of the importance of frequent follow-up visits to maximize her success with intensive lifestyle modifications for her multiple health conditions.   Objective:   Blood pressure 133/84, pulse 88, height '5\' 6"'$  (1.676 m), weight 290 lb (131.5 kg), last menstrual period 02/10/2014, SpO2 (!) 6 %. Body mass index is 46.81 kg/m.  General: Cooperative, alert, well developed, in no acute distress. HEENT: Conjunctivae and lids unremarkable. Cardiovascular: Regular rhythm.  Lungs: Normal work of breathing. Neurologic: No focal deficits.   Lab Results  Component Value Date   CREATININE 0.90 05/29/2021   BUN 10 05/29/2021   NA 139 05/29/2021   K 3.8 05/29/2021   CL 101 05/29/2021   CO2 29 05/29/2021   Lab Results  Component Value Date   ALT 25 01/26/2021   AST 17 01/26/2021   ALKPHOS 56 01/26/2021   BILITOT 0.5 10/29/2020   No results found for: HGBA1C No results found for: INSULIN Lab Results  Component Value Date   TSH 0.53 01/26/2021   Lab Results  Component Value Date   CHOL 176 01/26/2021   HDL 64 01/26/2021   LDLCALC 99 01/26/2021   TRIG 67 01/26/2021   CHOLHDL 3.5 09/23/2013   Lab Results  Component Value Date   VD25OH 31.16 02/05/2020   Lab Results  Component Value Date   WBC 7.7 05/29/2021   HGB 13.5 05/29/2021  HCT 40.6 05/29/2021   MCV 106.0 (H) 05/29/2021   PLT 183 05/29/2021   No results found for: IRON, TIBC, FERRITIN  Attestation Statements:   Reviewed by clinician on day of visit: allergies, medications, problem list, medical history, surgical history, family history, social history, and previous encounter notes.  I, Lizbeth Bark, RMA, am acting as  Location manager for [employer], DO.  I have reviewed the above documentation for accuracy and completeness, and I agree with the above. Jearld Lesch, DO

## 2022-01-02 DIAGNOSIS — G4733 Obstructive sleep apnea (adult) (pediatric): Secondary | ICD-10-CM | POA: Insufficient documentation

## 2022-01-02 DIAGNOSIS — I890 Lymphedema, not elsewhere classified: Secondary | ICD-10-CM | POA: Insufficient documentation

## 2022-01-02 DIAGNOSIS — D7589 Other specified diseases of blood and blood-forming organs: Secondary | ICD-10-CM | POA: Insufficient documentation

## 2022-01-02 DIAGNOSIS — G479 Sleep disorder, unspecified: Secondary | ICD-10-CM | POA: Insufficient documentation

## 2022-01-02 DIAGNOSIS — K219 Gastro-esophageal reflux disease without esophagitis: Secondary | ICD-10-CM | POA: Insufficient documentation

## 2022-01-02 DIAGNOSIS — J301 Allergic rhinitis due to pollen: Secondary | ICD-10-CM | POA: Insufficient documentation

## 2022-01-02 DIAGNOSIS — K760 Fatty (change of) liver, not elsewhere classified: Secondary | ICD-10-CM | POA: Insufficient documentation

## 2022-01-02 DIAGNOSIS — J45990 Exercise induced bronchospasm: Secondary | ICD-10-CM | POA: Insufficient documentation

## 2022-01-02 DIAGNOSIS — G9332 Myalgic encephalomyelitis/chronic fatigue syndrome: Secondary | ICD-10-CM | POA: Insufficient documentation

## 2022-01-02 DIAGNOSIS — F3340 Major depressive disorder, recurrent, in remission, unspecified: Secondary | ICD-10-CM | POA: Insufficient documentation

## 2022-01-02 DIAGNOSIS — G4700 Insomnia, unspecified: Secondary | ICD-10-CM | POA: Insufficient documentation

## 2022-01-02 DIAGNOSIS — F1010 Alcohol abuse, uncomplicated: Secondary | ICD-10-CM | POA: Insufficient documentation

## 2022-01-02 DIAGNOSIS — D472 Monoclonal gammopathy: Secondary | ICD-10-CM | POA: Insufficient documentation

## 2022-01-23 ENCOUNTER — Ambulatory Visit (INDEPENDENT_AMBULATORY_CARE_PROVIDER_SITE_OTHER): Payer: Commercial Managed Care - HMO | Admitting: Bariatrics

## 2022-01-23 ENCOUNTER — Encounter (INDEPENDENT_AMBULATORY_CARE_PROVIDER_SITE_OTHER): Payer: Self-pay | Admitting: Bariatrics

## 2022-01-23 VITALS — BP 110/69 | HR 77 | Temp 98.7°F | Ht 66.0 in | Wt 290.0 lb

## 2022-01-23 DIAGNOSIS — I10 Essential (primary) hypertension: Secondary | ICD-10-CM | POA: Diagnosis not present

## 2022-01-23 DIAGNOSIS — E669 Obesity, unspecified: Secondary | ICD-10-CM | POA: Diagnosis not present

## 2022-01-23 DIAGNOSIS — K760 Fatty (change of) liver, not elsewhere classified: Secondary | ICD-10-CM

## 2022-01-23 DIAGNOSIS — Z6841 Body Mass Index (BMI) 40.0 and over, adult: Secondary | ICD-10-CM | POA: Diagnosis not present

## 2022-01-24 NOTE — Progress Notes (Unsigned)
Chief Complaint:   OBESITY Mary Morrison is here to discuss her progress with her obesity treatment plan along with follow-up of her obesity related diagnoses. Mary Morrison is on the Category 2 Plan and states she is following her eating plan approximately 75% of the time. Mary Morrison states she is active while doing house work for 15 minutes 3 times per week.  Today's visit was #: 12 Starting weight: 289 lbs Starting date: 04/01/2021 Today's weight: 290 lbs Today's date: 01/23/2022 Total lbs lost to date: 0 Total lbs lost since last in-office visit: 0  Interim History: Mary Morrison's weight remains the same.  She has been on a cruise and had [place].  She started back doing NOOM.   Subjective:   1. Fatty liver Mary Morrison is not on medications currently.   2. Essential hypertension Mary Morrison's blood pressure is controlled.   Assessment/Plan:   1. Fatty liver Shatona will continue to work on the meal plan and exercise.   2. Essential hypertension Mary Morrison will continue her medications as directed.   3. Obesity with current BMI 46.8 Mary Morrison is currently in the action stage of change. As such, her goal is to continue with weight loss efforts. She has agreed to the Category 2 Plan.   Meal planning and intentional eating were discussed.  She is to increase her adherence to the meal plan 80 to 90%.  She will log her meals and she will not eat after 8 PM.  Exercise goals: As is, and remain active.  Behavioral modification strategies: increasing lean protein intake, decreasing simple carbohydrates, increasing vegetables, increasing water intake, decreasing eating out, no skipping meals, meal planning and cooking strategies, keeping healthy foods in the home, and planning for success.  Mary Morrison has agreed to follow-up with our clinic in 3 to 4 weeks. She was informed of the importance of frequent follow-up visits to maximize her success with intensive lifestyle modifications for her multiple health conditions.    Objective:   Blood pressure 110/69, pulse 77, temperature 98.7 F (37.1 C), height '5\' 6"'$  (1.676 m), weight 290 lb (131.5 kg), last menstrual period 02/10/2014, SpO2 97 %. Body mass index is 46.81 kg/m.  General: Cooperative, alert, well developed, in no acute distress. HEENT: Conjunctivae and lids unremarkable. Cardiovascular: Regular rhythm.  Lungs: Normal work of breathing. Neurologic: No focal deficits.   Lab Results  Component Value Date   CREATININE 0.90 05/29/2021   BUN 10 05/29/2021   NA 139 05/29/2021   K 3.8 05/29/2021   CL 101 05/29/2021   CO2 29 05/29/2021   Lab Results  Component Value Date   ALT 25 01/26/2021   AST 17 01/26/2021   ALKPHOS 56 01/26/2021   BILITOT 0.5 10/29/2020   No results found for: "HGBA1C" No results found for: "INSULIN" Lab Results  Component Value Date   TSH 0.53 01/26/2021   Lab Results  Component Value Date   CHOL 176 01/26/2021   HDL 64 01/26/2021   LDLCALC 99 01/26/2021   TRIG 67 01/26/2021   CHOLHDL 3.5 09/23/2013   Lab Results  Component Value Date   VD25OH 31.16 02/05/2020   Lab Results  Component Value Date   WBC 7.7 05/29/2021   HGB 13.5 05/29/2021   HCT 40.6 05/29/2021   MCV 106.0 (H) 05/29/2021   PLT 183 05/29/2021   No results found for: "IRON", "TIBC", "FERRITIN"  Attestation Statements:   Reviewed by clinician on day of visit: allergies, medications, problem list, medical history, surgical history, family history, social  history, and previous encounter notes.   Mary Morrison, am acting as Location manager for [employer], DO.  I have reviewed the above documentation for accuracy and completeness, and I agree with the above. Mary Lesch, DO

## 2022-01-25 ENCOUNTER — Encounter (INDEPENDENT_AMBULATORY_CARE_PROVIDER_SITE_OTHER): Payer: Self-pay | Admitting: Bariatrics

## 2022-02-20 ENCOUNTER — Ambulatory Visit (INDEPENDENT_AMBULATORY_CARE_PROVIDER_SITE_OTHER): Payer: Commercial Managed Care - HMO | Admitting: Bariatrics

## 2022-02-21 ENCOUNTER — Ambulatory Visit (INDEPENDENT_AMBULATORY_CARE_PROVIDER_SITE_OTHER): Payer: Commercial Managed Care - HMO | Admitting: Bariatrics

## 2022-02-21 ENCOUNTER — Encounter (INDEPENDENT_AMBULATORY_CARE_PROVIDER_SITE_OTHER): Payer: Self-pay | Admitting: Bariatrics

## 2022-02-21 VITALS — BP 123/71 | HR 80 | Temp 97.5°F | Ht 66.0 in | Wt 282.0 lb

## 2022-02-21 DIAGNOSIS — I10 Essential (primary) hypertension: Secondary | ICD-10-CM

## 2022-02-21 DIAGNOSIS — E669 Obesity, unspecified: Secondary | ICD-10-CM

## 2022-02-21 DIAGNOSIS — Z6841 Body Mass Index (BMI) 40.0 and over, adult: Secondary | ICD-10-CM

## 2022-02-21 DIAGNOSIS — I890 Lymphedema, not elsewhere classified: Secondary | ICD-10-CM

## 2022-02-21 DIAGNOSIS — K760 Fatty (change of) liver, not elsewhere classified: Secondary | ICD-10-CM

## 2022-03-01 ENCOUNTER — Encounter (INDEPENDENT_AMBULATORY_CARE_PROVIDER_SITE_OTHER): Payer: Self-pay

## 2022-03-01 NOTE — Progress Notes (Unsigned)
Chief Complaint:   OBESITY Mary Morrison is here to discuss her progress with her obesity treatment plan along with follow-up of her obesity related diagnoses. Mary Morrison is on the Category 2 Plan and states she is following her eating plan approximately 75% of the time. Mary Morrison states she is not currently exercising.   Today's visit was #: 69 Starting weight: 289 lbs Starting date: 04/01/2021 Today's weight: 282 lbs Today's date: 02/21/22 Total lbs lost to date: 7 Total lbs lost since last in-office visit: -8  Interim History: She is down 8 pounds from her last visit.  Subjective:   1. Essential hypertension Controlled.  2. Fatty liver No abdominal pain.  3. Lymphedema of both lower extremities Has lymphedema in both legs.   Assessment/Plan:   1. Essential hypertension Continue medication.  2. Fatty liver 1.  Will follow meal plan 2.  Continue with exercise *** 3.  Will schedule follow-up 1 year *** 4.  Will log her food.  3. Lymphedema of both lower extremities 1.  Return to wound center for evaluation for lymphedema pump at home. ***  4. Obesity with current BMI 45.6 1.  Will adhere closely to the plan 80 to 90% of the time. 2.  Will keep water and protein intake high.  Mary Morrison is currently in the action stage of change. As such, her goal is to continue with weight loss efforts. She has agreed to the Category 2 Plan.   Exercise goals: Work with [employer].  Increase her exercise goal to 45 minutes.  Behavioral modification strategies: increasing lean protein intake, decreasing simple carbohydrates, increasing vegetables, increasing water intake, decreasing eating out, no skipping meals, meal planning and cooking strategies, keeping healthy foods in the home, and avoiding temptations.  Mary Morrison has agreed to follow-up with our clinic in 4 weeks fasting.  Send for labs from [place].   She was informed of the importance of frequent follow-up visits to maximize her success with  intensive lifestyle modifications for her multiple health conditions.    Objective:   Blood pressure 123/71, pulse 80, temperature (!) 97.5 F (36.4 C), height '5\' 6"'$  (1.676 m), weight 282 lb (127.9 kg), last menstrual period 02/10/2014, SpO2 96 %. Body mass index is 45.52 kg/m.  General: Cooperative, alert, well developed, in no acute distress. HEENT: Conjunctivae and lids unremarkable. Cardiovascular: Regular rhythm.  Lungs: Normal work of breathing. Neurologic: No focal deficits.   Lab Results  Component Value Date   CREATININE 0.90 05/29/2021   BUN 10 05/29/2021   NA 139 05/29/2021   K 3.8 05/29/2021   CL 101 05/29/2021   CO2 29 05/29/2021   Lab Results  Component Value Date   ALT 25 01/26/2021   AST 17 01/26/2021   ALKPHOS 56 01/26/2021   BILITOT 0.5 10/29/2020   No results found for: "HGBA1C" No results found for: "INSULIN" Lab Results  Component Value Date   TSH 0.53 01/26/2021   Lab Results  Component Value Date   CHOL 176 01/26/2021   HDL 64 01/26/2021   LDLCALC 99 01/26/2021   TRIG 67 01/26/2021   CHOLHDL 3.5 09/23/2013   Lab Results  Component Value Date   VD25OH 31.16 02/05/2020   Lab Results  Component Value Date   WBC 7.7 05/29/2021   HGB 13.5 05/29/2021   HCT 40.6 05/29/2021   MCV 106.0 (H) 05/29/2021   PLT 183 05/29/2021   No results found for: "IRON", "TIBC", "FERRITIN"   Attestation Statements:   Reviewed by clinician  on day of visit: allergies, medications, problem list, medical history, surgical history, family history, social history, and previous encounter notes.  I, Dawn Whitmire, FNP-C, am acting as transcriptionist for Dr. Jearld Lesch.  I have reviewed the above documentation for accuracy and completeness, and I agree with the above. -  ***

## 2022-03-02 ENCOUNTER — Encounter (INDEPENDENT_AMBULATORY_CARE_PROVIDER_SITE_OTHER): Payer: Self-pay | Admitting: Bariatrics

## 2022-03-05 IMAGING — CT CT CARDIAC CORONARY ARTERY CALCIUM SCORE
3 series · 14 of 20 positions shown, 16 images · non-contrast
Comparison: 11/07/2013 chest radiograph.
COMPARISON: 11/07/2013 chest radiograph.

Addendum:
EXAM:
OVER-READ INTERPRETATION  CT CHEST

The following report is an over-read performed by radiologist Dr.
over-read does not include interpretation of cardiac or coronary
anatomy or pathology. The calcium score interpretation by the
cardiologist is attached.
CLINICAL DATA: Cardiovascular Disease Risk stratification
Coronary Calcium Score
TECHNIQUE: A gated, non-contrast computed tomography scan of the heart was
performed using 3mm slice thickness. Axial images were analyzed on a
dedicated workstation. Calcium scoring of the coronary arteries was
performed using the Agatston method.

[Series 2: cascseq 2.0 sa36 70% (id) · axial · 0.39mm/px · z∈[-253,-173]mm · 4 of 68 slices shown]
[im 14/68  vessel]
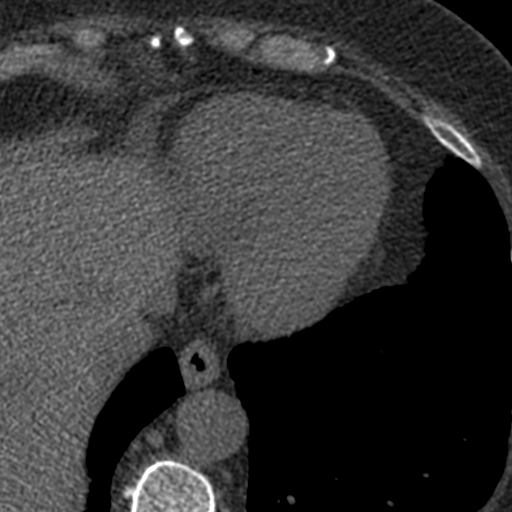
[im 27/68  vessel]
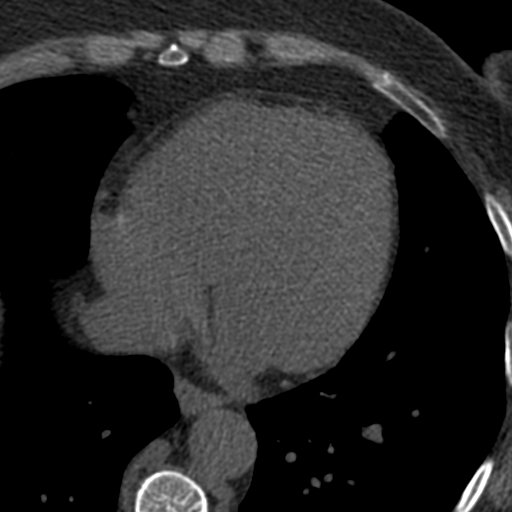
[im 41/68  vessel]
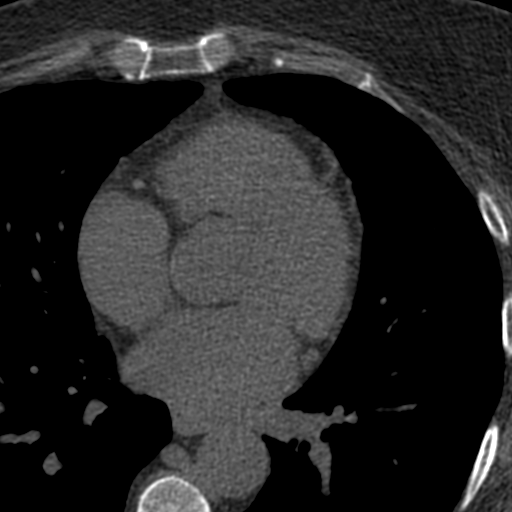
[im 54/68  vessel]
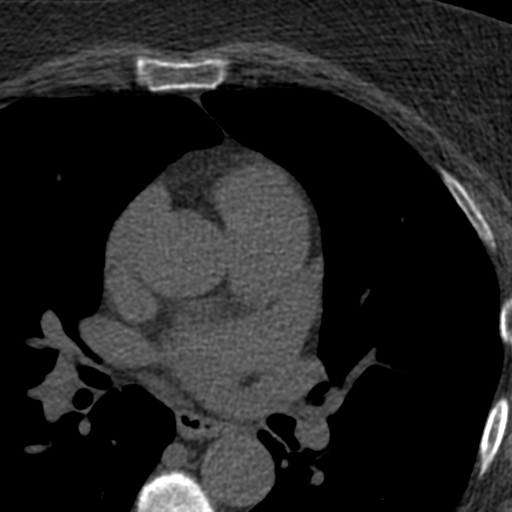

[Series 3: cascseq 2.0 bf37 st · axial · 0.71mm/px · z∈[-257,-169]mm · 5 of 68 slices shown, 7 images]
[im 12/68  vessel]
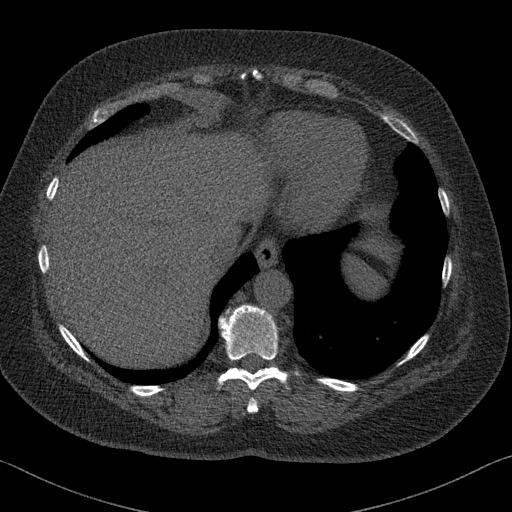
[im 12/68  lung]
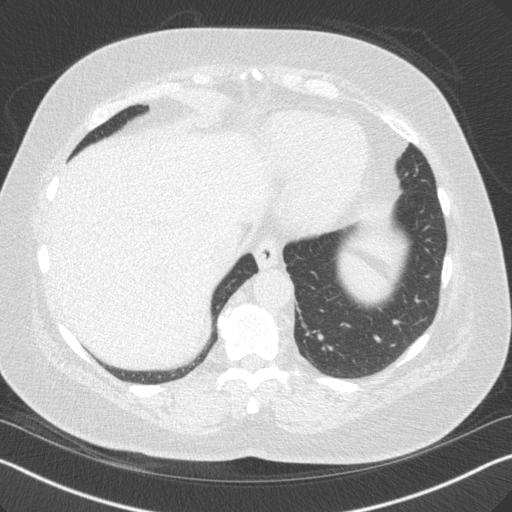
[im 23/68  vessel]
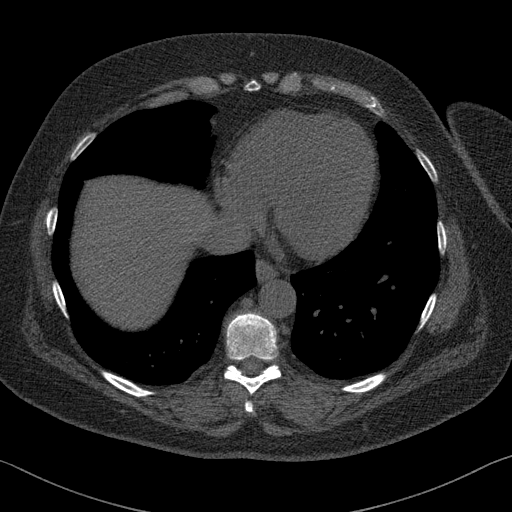
[im 34/68  vessel]
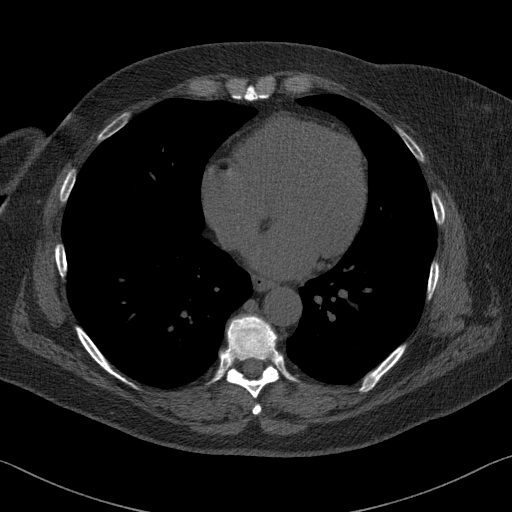
[im 45/68  vessel]
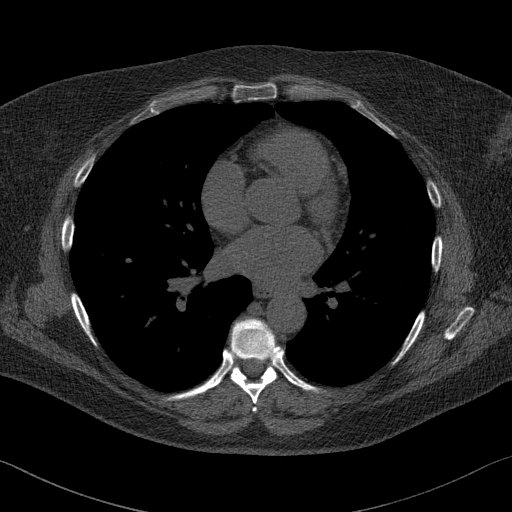
[im 56/68  vessel]
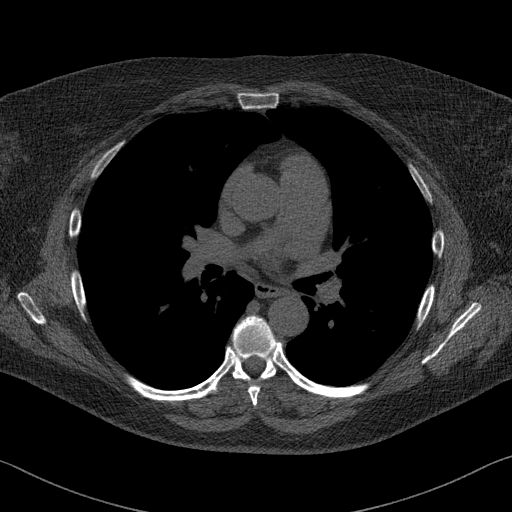
[im 56/68  lung]
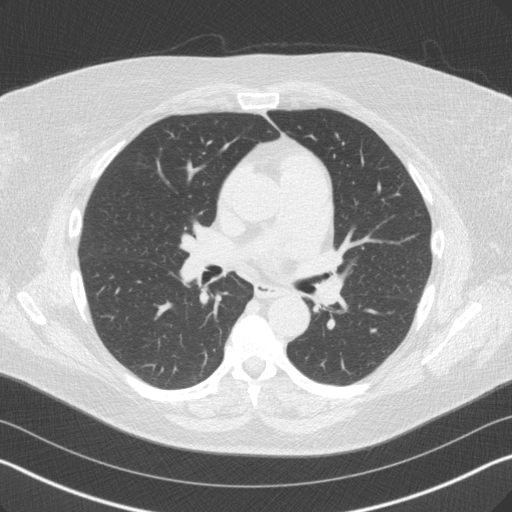

[Series 4: cascseq 2.0 br59 lung · axial · 0.71mm/px · z∈[-257,-169]mm · 5 of 68 slices shown]
[im 12/68  lung]
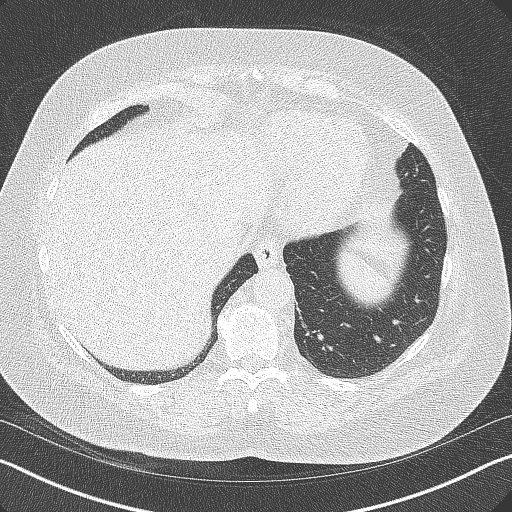
[im 23/68  lung]
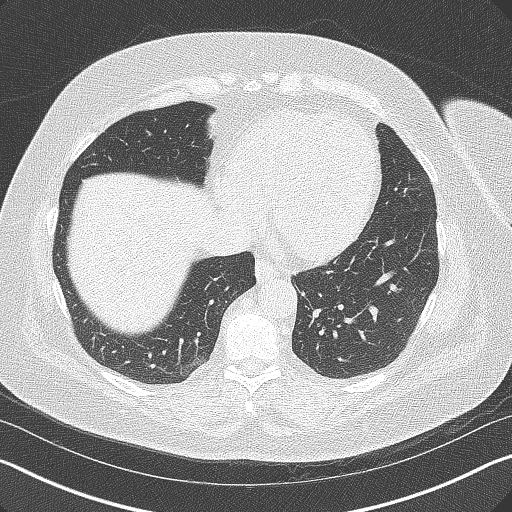
[im 34/68  lung]
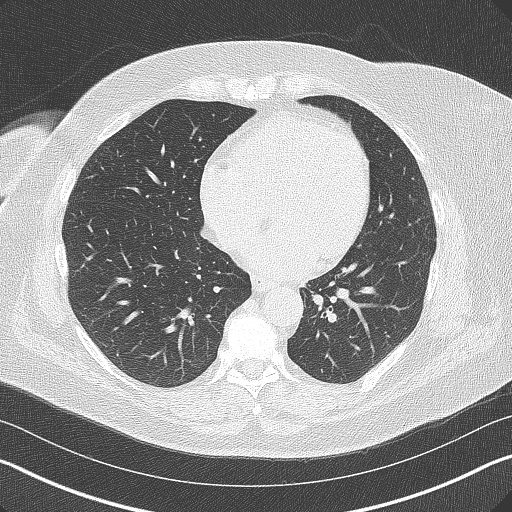
[im 45/68  lung]
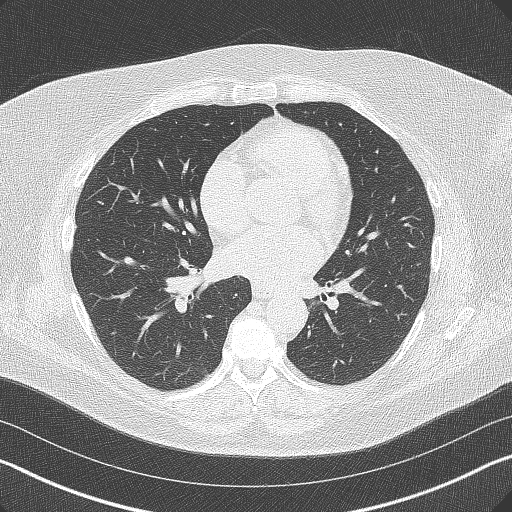
[im 56/68  lung]
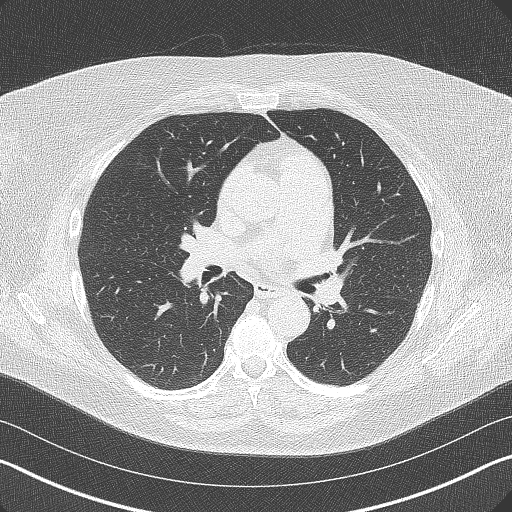

[14 of 20 positions shown; findings below may reference images not displayed]

FINDINGS: Vascular: Normal aortic caliber.

Mediastinum/Nodes: No imaged thoracic adenopathy.

Lungs/Pleura: No pleural fluid. Left lower lobe calcified granuloma
on [DATE].

Upper Abdomen: Normal imaged portions of the liver, spleen. Status
post lap band procedure.

Musculoskeletal: Remote posterior left lower rib fracture.
IMPRESSION: No acute findings in the imaged extracardiac chest.
FINDINGS: Coronary arteries: Normal origins.

Coronary Calcium Score:

Total: 0

Percentile: 0

Pericardium: Normal.

Ascending Aorta: Normal caliber.

Non-cardiac: See separate report from [REDACTED].
IMPRESSION: Coronary calcium score of 0. This was 0 percentile for age-, race-,
and sex-matched controls.



If CAC=0, it is reasonable to withhold statin therapy and reassess
in 5 to 10 years, as long as higher risk conditions are absent
(diabetes mellitus, family history of premature CHD in first degree
relatives (males <55 years; females <65 years), cigarette smoking,
or LDL >=190 mg/dL).

If CAC is 1 to 99, it is reasonable to initiate statin therapy for
patients >=55 years of age.

If CAC is >=100 or >=75th percentile, it is reasonable to initiate
statin therapy at any age.

Cardiology referral should be considered for patients with CAC
scores >=400 or >=75th percentile.

*2993 AHA/ACC/AACVPR/AAPA/ABC/[place]LANDI[place]O/JAMARI/DIKA/Es/GASCON/MOATSHE/LINJIA
Guideline on the Management of Blood Cholesterol: A Report of the
American College of Cardiology/American Heart Association Task Force
on Clinical Practice Guidelines. J Am Coll Cardiol.
7081;73(24):3852-3537.

*** End of Addendum ***
EXAM:
OVER-READ INTERPRETATION  CT CHEST

The following report is an over-read performed by radiologist Dr.
over-read does not include interpretation of cardiac or coronary
anatomy or pathology. The calcium score interpretation by the
cardiologist is attached.
FINDINGS: Vascular: Normal aortic caliber.

Mediastinum/Nodes: No imaged thoracic adenopathy.

Lungs/Pleura: No pleural fluid. Left lower lobe calcified granuloma
on [DATE].

Upper Abdomen: Normal imaged portions of the liver, spleen. Status
post lap band procedure.

Musculoskeletal: Remote posterior left lower rib fracture.
IMPRESSION: No acute findings in the imaged extracardiac chest.

## 2022-03-22 ENCOUNTER — Ambulatory Visit (INDEPENDENT_AMBULATORY_CARE_PROVIDER_SITE_OTHER): Payer: Commercial Managed Care - HMO | Admitting: Bariatrics

## 2022-03-29 ENCOUNTER — Ambulatory Visit (INDEPENDENT_AMBULATORY_CARE_PROVIDER_SITE_OTHER): Payer: Commercial Managed Care - HMO | Admitting: Bariatrics

## 2022-03-29 ENCOUNTER — Encounter (INDEPENDENT_AMBULATORY_CARE_PROVIDER_SITE_OTHER): Payer: Self-pay | Admitting: Bariatrics

## 2022-03-29 VITALS — BP 125/83 | HR 80 | Temp 97.6°F | Ht 66.0 in | Wt 280.0 lb

## 2022-03-29 DIAGNOSIS — K760 Fatty (change of) liver, not elsewhere classified: Secondary | ICD-10-CM | POA: Diagnosis not present

## 2022-03-29 DIAGNOSIS — E669 Obesity, unspecified: Secondary | ICD-10-CM

## 2022-03-29 DIAGNOSIS — Z6841 Body Mass Index (BMI) 40.0 and over, adult: Secondary | ICD-10-CM | POA: Diagnosis not present

## 2022-04-06 NOTE — Progress Notes (Unsigned)
Chief Complaint:   OBESITY Mary Morrison is here to discuss her progress with her obesity treatment plan along with follow-up of her obesity related diagnoses. Mary Morrison is on the Category 2 Plan and states she is following her eating plan approximately 50% of the time. Mary Morrison states she is doing yoga for 60 minutes 2 times per week.  Today's visit was #: 14 Starting weight: 289 lbs Starting date: 04/01/2021 Today's weight: 280 lbs Today's date: 03/29/2022 Total lbs lost to date: 9 Total lbs lost since last in-office visit: 2  Interim History: Mary Morrison is down 2 pounds since her last visit.  She has been traveling and eating badly.  She is doing okay with her water and protein intake.  Subjective:   1. Fatty liver Mary Morrison denies abdominal pain.   Assessment/Plan:   1. Fatty liver Mary Morrison will work on her meal plan and exercise, and increase cardio and resistance.   2. Obesity with current BMI 45.3 Mary Morrison is currently in the action stage of change. As such, her goal is to continue with weight loss efforts. She has agreed to the Category 2 Plan, keeping a food journal and adhering to recommended goals of 1800 calories and 100-120 grams of protein daily, the Mary Morrison, and practicing portion control and making smarter food choices, such as increasing vegetables and decreasing simple carbohydrates.   Mary Morrison will adhere to the plan at least 80 to 90%.  Intentional eating was discussed.  We will resume tracking.  Exercise goals: As is, and will increase resistance training.  Behavioral modification strategies: increasing lean protein intake, decreasing simple carbohydrates, increasing vegetables, increasing water intake, decreasing eating out, no skipping meals, meal planning and cooking strategies, keeping healthy foods in the home, and planning for success.  Mary Morrison has agreed to follow-up with our clinic in 4 weeks. She was informed of the importance of frequent follow-up visits  to maximize her success with intensive lifestyle modifications for her multiple health conditions.   Objective:   Blood pressure 125/83, pulse 80, temperature 97.6 F (36.4 C), last menstrual period 02/10/2014, SpO2 98 %. There is no height or weight on file to calculate BMI.  General: Cooperative, alert, well developed, in no acute distress. HEENT: Conjunctivae and lids unremarkable. Cardiovascular: Regular rhythm.  Lungs: Normal work of breathing. Neurologic: No focal deficits.   Lab Results  Component Value Date   CREATININE 0.90 05/29/2021   BUN 10 05/29/2021   NA 139 05/29/2021   K 3.8 05/29/2021   CL 101 05/29/2021   CO2 29 05/29/2021   Lab Results  Component Value Date   ALT 25 01/26/2021   AST 17 01/26/2021   ALKPHOS 56 01/26/2021   BILITOT 0.5 10/29/2020   No results found for: "HGBA1C" No results found for: "INSULIN" Lab Results  Component Value Date   TSH 0.53 01/26/2021   Lab Results  Component Value Date   CHOL 176 01/26/2021   HDL 64 01/26/2021   LDLCALC 99 01/26/2021   TRIG 67 01/26/2021   CHOLHDL 3.5 09/23/2013   Lab Results  Component Value Date   VD25OH 31.16 02/05/2020   Lab Results  Component Value Date   WBC 7.7 05/29/2021   HGB 13.5 05/29/2021   HCT 40.6 05/29/2021   MCV 106.0 (H) 05/29/2021   PLT 183 05/29/2021   No results found for: "IRON", "TIBC", "FERRITIN"  Attestation Statements:   Reviewed by clinician on day of visit: allergies, medications, problem list, medical history, surgical history, family history,  social history, and previous encounter notes.   Wilhemena Durie, am acting as Location manager for [employer], DO.  I have reviewed the above documentation for accuracy and completeness, and I agree with the above. Jearld Lesch, DO

## 2022-04-10 ENCOUNTER — Encounter (INDEPENDENT_AMBULATORY_CARE_PROVIDER_SITE_OTHER): Payer: Self-pay | Admitting: Bariatrics

## 2022-04-27 ENCOUNTER — Ambulatory Visit (INDEPENDENT_AMBULATORY_CARE_PROVIDER_SITE_OTHER): Payer: Commercial Managed Care - HMO | Admitting: Family Medicine

## 2022-08-04 ENCOUNTER — Other Ambulatory Visit: Payer: Self-pay | Admitting: *Deleted

## 2022-08-04 DIAGNOSIS — R601 Generalized edema: Secondary | ICD-10-CM

## 2022-08-18 ENCOUNTER — Ambulatory Visit (HOSPITAL_COMMUNITY)
Admission: RE | Admit: 2022-08-18 | Discharge: 2022-08-18 | Disposition: A | Discharge status: Home / Self Care | Payer: Commercial Managed Care - HMO | Source: Ambulatory Visit | Attending: Vascular Surgery | Admitting: Vascular Surgery

## 2022-08-18 ENCOUNTER — Ambulatory Visit (INDEPENDENT_AMBULATORY_CARE_PROVIDER_SITE_OTHER): Payer: Commercial Managed Care - HMO | Admitting: Physician Assistant

## 2022-08-18 VITALS — BP 122/82 | HR 80 | Temp 98.5°F | Resp 22 | Ht 66.0 in | Wt 280.0 lb

## 2022-08-18 DIAGNOSIS — R601 Generalized edema: Secondary | ICD-10-CM | POA: Insufficient documentation

## 2022-08-18 DIAGNOSIS — M7989 Other specified soft tissue disorders: Secondary | ICD-10-CM | POA: Diagnosis not present

## 2022-08-18 NOTE — Progress Notes (Signed)
VASCULAR [facility] VEIN SPECIALISTS           OF [place]  History and Physical   Mary Morrison is a 57 y.o. female who presents with BLE swelling with left worse than right.  She was previously seen in our office in 2018 for BLE swelling.  Her duplex in February 2018 revealed Venous incompetence noted in the bilateral saphenofemoral junctions and great saphenous veins with right 1.04 cm and left 0.64 cm. There is reflux throughout both GSV.   On 03/15/2017 she underwent laser ablation of the left GSV by Dr. Donnetta Hutching and f/u one week later revealed total closure of the left GSV from proximal calf to near the SFJ.  On 04/12/2017, she underwent laser ablation of the right GSV also by Dr. Donnetta Hutching.  Post procedure revealed total closure of the GSV from mid calf to near the SFJ.  She also underwent sclerotherapy.    She returns today with c/o continued swelling in both legs.  She states the left is worse than the right.  She states she really didn't get the relief she thought she would with laser ablation in 2018.  She does not have hx of DVT.  She does not have any skin color changes in her lower legs.  She does have a scabbed area on the distal portion of the left medial leg above the ankle. She states this has been present for a couple of years and when it is close to healed, she will pick at it.  She denies any claudication.  Her legs do get achy when she has been walking for a long period of time due to the swelling.  She does not wear compression bc they hurt and cut into her skin.  She does elevate her legs, which helps with her swelling.  She states she did use Noom for a while for weight loss.  She is now on Mounjaro.  She states her PCP changed her diuretic to lasix and this has helped significantly.   She has hx of bilateral knee replacements.   She states when she is at the beach, she has to physically take the dogs out and she states she feels better with exercise it is just getting  motivated to do it.     The pt is not on a statin for cholesterol management.  The pt is not on a daily aspirin.   Other AC:  none The pt is on diuretic, ARB for hypertension.   The pt is not diabetic.   Tobacco hx:  never  Pt does not have family hx of AAA.  Past Medical History:  Diagnosis Date   ADHD    Alcohol abuse    Anxiety    Arthritis    Asthma    EXERCISED INDUCED 2014    Depression    Drug use    Edema, lower extremity    Fatty liver    Fibroids    High blood pressure    Obesity    Seasonal allergies    Sleep apnea    SOB (shortness of breath)    Varicose vein of leg    Vitamin B 12 deficiency     Past Surgical History:  Procedure Laterality Date   BILATERAL SALPINGECTOMY Bilateral 09/21/2014   Procedure: BILATERAL SALPINGECTOMY;  Surgeon: Marvene Staff, MD;  Location: [facility];  Service: Gynecology;  Laterality: Bilateral;   [place]   COLONOSCOPY  LAPAROSCOPIC GASTRIC BANDING N/A 02/17/2014   Procedure: LAPAROSCOPIC GASTRIC BANDING;  Surgeon: Pedro Earls, MD;  Location: [facility];  Service: General;  Laterality: N/A;   ROBOT ASSISTED MYOMECTOMY N/A 09/21/2014   Procedure: ROBOTIC ASSISTED MYOMECTOMY;  Surgeon: Marvene Staff, MD;  Location: [facility];  Service: Gynecology;  Laterality: N/A;   ROBOTIC ASSISTED TOTAL HYSTERECTOMY N/A 09/21/2014   Procedure: ROBOTIC ASSISTED TOTAL HYSTERECTOMY, EXCISION OF PELVIC ENDOMETRIOSIS;  Surgeon: Marvene Staff, MD;  Location: [facility];  Service: Gynecology;  Laterality: N/A;   TONSILLECTOMY     TOTAL KNEE ARTHROPLASTY Left 07/03/2017   Procedure: LEFT TOTAL KNEE ARTHROPLASTY;  Surgeon: Meredith Pel, MD;  Location: [facility];  Service: Orthopedics;  Laterality: Left;   TOTAL KNEE ARTHROPLASTY Right 07/10/2019   Procedure: RIGHT TOTAL KNEE ARTHROPLASTY;  Surgeon: Meredith Pel, MD;  Location: [facility];  Service: Orthopedics;  Laterality: Right;    Social History   Socioeconomic  History   Marital status: Divorced    Spouse name: Not on file   Number of children: Not on file   Years of education: Not on file   Highest education level: Not on file  Occupational History   Occupation: Self employed  Tobacco Use   Smoking status: Never   Smokeless tobacco: Never  Vaping Use   Vaping Use: Never used  Substance and Sexual Activity   Alcohol use: No   Drug use: No   Sexual activity: Not Currently    Birth control/protection: None  Other Topics Concern   Not on file  Social History Narrative   Not on file   Social Determinants of Health   Financial Resource Strain: Not on file  Food Insecurity: Not on file  Transportation Needs: Not on file  Physical Activity: Not on file  Stress: Not on file  Social Connections: Not on file  Intimate Partner Violence: Not on file    Family History  Problem Relation Age of Onset   Cancer Mother        breast/bilateral mastectomies   High blood pressure Mother    Depression Mother    Heart disease Mother    Anxiety disorder Mother    Obesity Mother    Alcoholism Father    Diabetes Other    Hypertension Other    Stroke Other    Obesity Other     Current Outpatient Medications  Medication Sig Dispense Refill   albuterol (VENTOLIN HFA) 108 (90 Base) MCG/ACT inhaler Inhale 1-2 puffs into the lungs every 6 (six) hours as needed for wheezing or shortness of breath. 1 each 0   Ascorbic Acid (VITAMIN C) 1000 MG tablet Take 1,000 mg by mouth daily.     buPROPion (WELLBUTRIN XL) 300 MG 24 hr tablet Take 300 mg by mouth daily.      Calcium-Vitamin D-Vitamin K (VIACTIV PO) Take 1 tablet by mouth daily.     Cyanocobalamin (B-12 PO) Take 1 tablet by mouth daily. Patient is unsure of dose     diclofenac Sodium (VOLTAREN) 1 % GEL Apply 2 g topically daily as needed (pain).     escitalopram (LEXAPRO) 10 MG tablet Take 20 mg by mouth at bedtime.     ibuprofen (ADVIL) 800 MG tablet TAKE 1 TABLET BY MOUTH EVERY 8 (EIGHT) HOURS  AS NEEDED FOR MODERATE PAIN (MUST TAKE WITH FOOD). 60 tablet 2   losartan (COZAAR) 50 MG tablet Take 50 mg by mouth daily.     Menthol, Topical Analgesic, (  BIOFREEZE EX) Apply 1 application topically daily as needed (pain).     Multiple Vitamins-Minerals (EMERGEN-C VITAMIN C PO) Take 1 tablet by mouth daily.     Probiotic Product (PROBIOTIC PO) Take 1 capsule by mouth daily.     traZODone (DESYREL) 50 MG tablet Take 200 mg by mouth at bedtime.     triamterene-hydrochlorothiazide (MAXZIDE-25) 37.5-25 MG tablet Take 1 tablet by mouth daily.   0   zinc gluconate 50 MG tablet Take 50 mg by mouth daily.     No current facility-administered medications for this visit.    Allergies  Allergen Reactions   Dalbavancin     Other reaction(s): rash    REVIEW OF SYSTEMS:   '[X]'$  denotes positive finding, '[ ]'$  denotes negative finding Cardiac  Comments:  Chest pain or chest pressure:    Shortness of breath upon exertion:    Short of breath when lying flat:    Irregular heart rhythm:        Vascular    Pain in calf, thigh, or hip brought on by ambulation:    Pain in feet at night that wakes you up from your sleep:     Blood clot in your veins:    Leg swelling:  x       Pulmonary    Oxygen at home:    Productive cough:     Wheezing:         Neurologic    Sudden weakness in arms or legs:     Sudden numbness in arms or legs:     Sudden onset of difficulty speaking or slurred speech:    Temporary loss of vision in one eye:     Problems with dizziness:         Gastrointestinal    Blood in stool:     Vomited blood:         Genitourinary    Burning when urinating:     Blood in urine:        Psychiatric    Major depression:         Hematologic    Bleeding problems:    Problems with blood clotting too easily:        Skin    Rashes or ulcers:        Constitutional    Fever or chills:      PHYSICAL EXAMINATION:  Today's Vitals   08/18/22 1242  BP: 122/82  Pulse: 80  Resp:  (!) 22  Temp: 98.5 F (36.9 C)  TempSrc: Oral  SpO2: 100%  Weight: 280 lb (127 kg)  Height: '5\' 6"'$  (1.676 m)   Body mass index is 45.19 kg/m.   General:  WDWN in NAD; vital signs documented above Gait: Not observed HENT: WNL, normocephalic Pulmonary: normal non-labored breathing without wheezing Cardiac: regular HR; without carotid bruits Abdomen: soft, NT, aortic pulse is not palpable Skin: without rashes Vascular Exam/Pulses:  Right Left  Radial 2+ (normal) 2+ (normal)  AT Monophasic doppler Monophasic doppler  PT Multiphasic doppler Multiphasic doppler   Extremities: +BLE swelling; - stemmer sign; small scabbed area on left lower leg medially just above the ankle  Neurologic: A&O X 3;  moving all extremities equally Psychiatric:  The pt has Normal affect.   Non-Invasive Vascular Imaging:   Venous duplex on 08/18/2022: +--------------+--------+------+----------+------------+-------------------  ----+  LEFT         Reflux  Reflux  Reflux  Diameter cmsComments  No       Yes     Time                                   +--------------+--------+------+----------+------------+-------------------  CFV          no                                                      +--------------+--------+------+----------+------------+-------------------  FV mid        no                                                      +--------------+--------+------+----------+------------+-------------------  Popliteal    no                                                      +--------------+--------+------+----------+------------+-------------------  GSV at SFJ             yes   >500 ms      0.82                        +--------------+--------+------+----------+------------+-------------------  GSV prox thigh                                    prior ablation/stripping   +-------------+--------+------+----------+------------+-------------------  GSV mid thigh                                     prior  ablation/stripping  +--------------+--------+------+----------+------------+-------------------  GSV dist thigh                                    prior ablation/stripping  +--------------+--------+------+----------+------------+-------------------  GSV at knee                                       prior  ablation/stripping  +--------------+--------+------+----------+------------+-------------------  GSV prox calf                                     prior ablation/stripping  +--------------+--------+------+----------+------------+-------------------  SSV Pop Fossa no                          0.25                        +--------------+--------+------+----------+------------+-------------------   Summary:  Left:  - No evidence of deep vein thrombosis from the common femoral through the popliteal veins.  - No evidence of superficial venous thrombosis.  - The  deep venous system is competent.  - The great saphenous vein has been ablated..  - The small saphenous vein is competent.     Mary Morrison is a 57 y.o. female who presents with: BLE swelling with left > right  and has hx of BLE laser ablation of GSV in 2018 by Dr. Donnetta Hutching with subsequent sclerotherapy.  -pt has brisk multiphasic PT doppler flow bilaterally -pt does not have evidence of DVT.  Pt only has venous reflux in the GSV at the [facility].  Her deep venous system is competent.   -discussed with pt about wearing compression stockings but these are very uncomfortable to her.  Also discussed wrapping legs with ace wrap but she does not think she will do this either.  -discussed the importance of leg elevation and how to elevate properly - pt is advised to elevate their legs and a diagram is given to them to demonstrate for pt to lay flat on their back with knees elevated and  slightly bent with their feet higher than their knees, which puts their feet higher than their heart for 15 minutes per day.  If pt cannot lay flat, advised to lay as flat as possible. She does have a bed that elevates her legs.   -pt is advised to continue as much walking as possible and avoid sitting or standing for long periods of time.  -discussed importance of weight loss and exercise and that water aerobics would also be beneficial.  Encouraged her to stay as active as possible.  -handout with recommendations given -pt will f/u as needed.     Leontine Locket, [facility] [phone]  Clinic MD:  Donzetta Matters on call MD

## 2022-09-11 DIAGNOSIS — R69 Illness, unspecified: Secondary | ICD-10-CM | POA: Diagnosis not present

## 2022-10-10 DIAGNOSIS — F338 Other recurrent depressive disorders: Secondary | ICD-10-CM | POA: Diagnosis not present

## 2022-10-25 DIAGNOSIS — F338 Other recurrent depressive disorders: Secondary | ICD-10-CM | POA: Diagnosis not present

## 2022-11-14 DIAGNOSIS — F338 Other recurrent depressive disorders: Secondary | ICD-10-CM | POA: Diagnosis not present

## 2023-01-30 DIAGNOSIS — F338 Other recurrent depressive disorders: Secondary | ICD-10-CM | POA: Diagnosis not present

## 2023-02-19 DIAGNOSIS — F338 Other recurrent depressive disorders: Secondary | ICD-10-CM | POA: Diagnosis not present

## 2023-02-22 ENCOUNTER — Other Ambulatory Visit: Payer: Self-pay

## 2023-02-22 ENCOUNTER — Encounter: Payer: Self-pay | Admitting: Orthopedic Surgery

## 2023-02-22 ENCOUNTER — Other Ambulatory Visit (INDEPENDENT_AMBULATORY_CARE_PROVIDER_SITE_OTHER): Payer: Self-pay

## 2023-02-22 ENCOUNTER — Ambulatory Visit: Payer: 59 | Admitting: Orthopedic Surgery

## 2023-02-22 ENCOUNTER — Other Ambulatory Visit (INDEPENDENT_AMBULATORY_CARE_PROVIDER_SITE_OTHER): Payer: 59

## 2023-02-22 DIAGNOSIS — M79601 Pain in right arm: Secondary | ICD-10-CM

## 2023-02-22 DIAGNOSIS — M659 Synovitis and tenosynovitis, unspecified: Secondary | ICD-10-CM | POA: Diagnosis not present

## 2023-02-22 MED ORDER — METHOCARBAMOL 500 MG PO TABS: 500.0000 mg | ORAL_TABLET | Freq: Three times a day (TID) | ORAL | 0 refills | Status: DC | PRN

## 2023-02-22 MED ORDER — TRAMADOL HCL 50 MG PO TABS
50.0000 mg | ORAL_TABLET | Freq: Three times a day (TID) | ORAL | 0 refills | Status: DC | PRN
Start: 2023-02-22 — End: 2023-10-05

## 2023-02-22 MED ORDER — PREDNISONE 5 MG (21) PO TBPK: ORAL_TABLET | ORAL | 0 refills | Status: DC

## 2023-02-23 ENCOUNTER — Encounter: Payer: Self-pay | Admitting: Orthopedic Surgery

## 2023-02-23 MED ORDER — BUPIVACAINE HCL 0.5 % IJ SOLN
9.0000 mL | INTRAMUSCULAR | Status: AC | PRN
  Administered 2023-02-22: 9 mL via INTRA_ARTICULAR

## 2023-02-23 MED ORDER — METHYLPREDNISOLONE ACETATE 40 MG/ML IJ SUSP
40.0000 mg | INTRAMUSCULAR | Status: AC | PRN
  Administered 2023-02-22: 40 mg via INTRA_ARTICULAR

## 2023-02-23 MED ORDER — LIDOCAINE HCL 1 % IJ SOLN
5.0000 mL | INTRAMUSCULAR | Status: AC | PRN
  Administered 2023-02-22: 5 mL

## 2023-02-23 NOTE — Progress Notes (Signed)
Office Visit Note   Patient: Mary Morrison           Date of Birth: [birth year]           MRN: [id number] Visit Date: 02/22/2023 Requested by: Laurann Montana, MD [id number] [place],  [place] [address] PCP: Laurann Montana, MD  Subjective: Chief Complaint  Patient presents with   Other     Right arm pain    HPI: Mary Morrison is a 57 y.o. female who presents to the office reporting right shoulder pain of 2 months duration.  Getting worse.  Denies any history of injury.  Describes relatively constant pain.  She also has some component of neck and scapular pain with numbness and tingling down to the fingers.  Pain radiates down the back of the arm.  She states that her arm feels very weak.  She is right-hand dominant.  Waking her from sleep at night.  She did try deep tissue massage which helps loosen some of the tight muscles.  Leaves for a cruise on 03/01/2023.  Hurts her to turn the steering wheel.  Hard for her to pull her close up.  She also reports that she is dropping things.  Describes some paresthesias in the C6 distribution..                ROS: All systems reviewed are negative as they relate to the chief complaint within the history of present illness.  Patient denies fevers or chills.  Assessment & Plan: Visit Diagnoses:  1. Right arm pain     Plan: Impression is right shoulder pain which could be radicular in origin based on the radiographic and history findings.  Could also be early frozen shoulder from the loss of passive external rotation.  Plan at this time is ultrasound-guided glenohumeral joint injection followed by Medrol Dosepak and Robaxin.  Ultram to be used for pain.  Could come back for further workup including MRI scanning of the neck and/or shoulder after her cruise.  We will see how she does with these interventions.  Follow-Up Instructions: No follow-ups on file.   Orders:  Orders Placed This Encounter  Procedures   XR Shoulder  Right   XR Cervical Spine 2 or 3 views   US Guided Needle Placement - No Linked Charges   Meds ordered this encounter  Medications   predniSONE (STERAPRED UNI-PAK 21 TAB) 5 MG (21) TBPK tablet    Sig: Take dosepak as directed    Dispense:  21 tablet    Refill:  0   methocarbamol (ROBAXIN) 500 MG tablet    Sig: Take 1 tablet (500 mg total) by mouth every 8 (eight) hours as needed for muscle spasms.    Dispense:  30 tablet    Refill:  0   traMADol (ULTRAM) 50 MG tablet    Sig: Take 1 tablet (50 mg total) by mouth every 8 (eight) hours as needed.    Dispense:  30 tablet    Refill:  0      Procedures: Large Joint Inj: R glenohumeral on 02/22/2023 10:04 PM Indications: diagnostic evaluation and pain Details: 22 G 3.5 in needle, ultrasound-guided posterior approach  Arthrogram: No  Medications: 9 mL bupivacaine 0.5 %; 40 mg methylPREDNISolone acetate 40 MG/ML; 5 mL lidocaine 1 % Outcome: tolerated well, no immediate complications Procedure, treatment alternatives, risks and benefits explained, specific risks discussed. Consent was given by the patient. Immediately prior to procedure a time out  was called to verify the correct patient, procedure, equipment, support staff and site/side marked as required. Patient was prepped and draped in the usual sterile fashion.       Clinical Data: No additional findings.  Objective: Vital Signs: LMP 02/10/2014   Physical Exam:  Constitutional: Patient appears well-developed HEENT:  Head: Normocephalic Eyes:EOM are normal Neck: Normal range of motion Cardiovascular: Normal rate Pulmonary/chest: Effort normal Neurologic: Patient is alert Skin: Skin is warm Psychiatric: Patient has normal mood and affect  Ortho Exam: Ortho exam demonstrates passive range of motion on the right shoulder 50/110/175.  Passive range of motion on the left is 70/120/180.  Rotator cuff strength intact infraspinatus supraspinatus and subscap muscle testing  bilaterally.  Neck range of motion also intact with flexion extension and rotation.  Some paresthesias on the right in the C6 distribution.  5 out of 5 grip EPL FPL interosseous wrist flexion extension bicep triceps and deltoid strength.  No other masses lymphadenopathy or skin changes noted in that right shoulder or neck region.  Reflexes symmetric 1+ out of 4 bilateral biceps and triceps.  Specialty Comments:  No specialty comments available.  Imaging: No results found.   PMFS History: Patient Active Problem List   Diagnosis Date Noted   Alcohol abuse, uncomplicated 01/02/2022   Allergic rhinitis due to pollen 01/02/2022   Chronic fatigue syndrome 01/02/2022   Insomnia 01/02/2022   Exercise-induced asthma 01/02/2022   Fatty liver 01/02/2022   Gastroesophageal reflux disease without esophagitis 01/02/2022   Lymphedema of leg 01/02/2022   Macrocytosis 01/02/2022   Monoclonal paraproteinemia 01/02/2022   Obstructive sleep apnea (adult) (pediatric) 01/02/2022   Recurrent major depression in remission (HCC) 01/02/2022   Sleep disorder 01/02/2022   Eating disorder 07/07/2021   Dyspnea on exertion 06/20/2021   Essential hypertension 06/20/2021   Arthritis of right knee 07/10/2019   Arthritis of knee 07/03/2017   Varicose veins of bilateral lower extremities with other complications 03/20/2017   Acute pain of left knee 01/25/2017   Swelling of left knee joint 01/25/2017   S/P hysterectomy 09/21/2014   Lapband APS July 2015 02/17/2014   Edema-chronic pitting in legs 01/20/2014   Morbid obesity (HCC) 08/28/2013   Past Medical History:  Diagnosis Date   ADHD    Alcohol abuse    Anxiety    Arthritis    Asthma    EXERCISED INDUCED 2014    Depression    Drug use    Edema, lower extremity    Fatty liver    Fibroids    High blood pressure    Obesity    Seasonal allergies    Sleep apnea    SOB (shortness of breath)    Varicose vein of leg    Vitamin B 12 deficiency      Family History  Problem Relation Age of Onset   Cancer Mother        breast/bilateral mastectomies   High blood pressure Mother    Depression Mother    Heart disease Mother    Anxiety disorder Mother    Obesity Mother    Alcoholism Father    Diabetes Other    Hypertension Other    Stroke Other    Obesity Other     Past Surgical History:  Procedure Laterality Date   BILATERAL SALPINGECTOMY Bilateral 09/21/2014   Procedure: BILATERAL SALPINGECTOMY;  Surgeon: Serita Kyle, MD;  Location: [facility];  Service: Gynecology;  Laterality: Bilateral;   CESAREAN SECTION  1989  COLONOSCOPY     LAPAROSCOPIC GASTRIC BANDING N/A 02/17/2014   Procedure: LAPAROSCOPIC GASTRIC BANDING;  Surgeon: Valarie Merino, MD;  Location: [facility];  Service: General;  Laterality: N/A;   ROBOT ASSISTED MYOMECTOMY N/A 09/21/2014   Procedure: ROBOTIC ASSISTED MYOMECTOMY;  Surgeon: Serita Kyle, MD;  Location: [facility];  Service: Gynecology;  Laterality: N/A;   ROBOTIC ASSISTED TOTAL HYSTERECTOMY N/A 09/21/2014   Procedure: ROBOTIC ASSISTED TOTAL HYSTERECTOMY, EXCISION OF PELVIC ENDOMETRIOSIS;  Surgeon: Serita Kyle, MD;  Location: [facility];  Service: Gynecology;  Laterality: N/A;   TONSILLECTOMY     TOTAL KNEE ARTHROPLASTY Left 07/03/2017   Procedure: LEFT TOTAL KNEE ARTHROPLASTY;  Surgeon: Cammy Copa, MD;  Location: [facility];  Service: Orthopedics;  Laterality: Left;   TOTAL KNEE ARTHROPLASTY Right 07/10/2019   Procedure: RIGHT TOTAL KNEE ARTHROPLASTY;  Surgeon: Cammy Copa, MD;  Location: [facility];  Service: Orthopedics;  Laterality: Right;   Social History   Occupational History   Occupation: Self employed  Tobacco Use   Smoking status: Never   Smokeless tobacco: Never  Vaping Use   Vaping status: Never Used  Substance and Sexual Activity   Alcohol use: No   Drug use: No   Sexual activity: Not Currently    Birth control/protection: None

## 2023-05-08 DIAGNOSIS — I10 Essential (primary) hypertension: Secondary | ICD-10-CM | POA: Diagnosis not present

## 2023-05-08 DIAGNOSIS — D472 Monoclonal gammopathy: Secondary | ICD-10-CM | POA: Diagnosis not present

## 2023-05-10 DIAGNOSIS — G4700 Insomnia, unspecified: Secondary | ICD-10-CM | POA: Diagnosis not present

## 2023-05-10 DIAGNOSIS — J4530 Mild persistent asthma, uncomplicated: Secondary | ICD-10-CM | POA: Diagnosis not present

## 2023-05-10 DIAGNOSIS — Z0000 Encounter for general adult medical examination without abnormal findings: Secondary | ICD-10-CM | POA: Diagnosis not present

## 2023-05-10 DIAGNOSIS — R5382 Chronic fatigue, unspecified: Secondary | ICD-10-CM | POA: Diagnosis not present

## 2023-05-10 DIAGNOSIS — I872 Venous insufficiency (chronic) (peripheral): Secondary | ICD-10-CM | POA: Diagnosis not present

## 2023-05-10 DIAGNOSIS — I10 Essential (primary) hypertension: Secondary | ICD-10-CM | POA: Diagnosis not present

## 2023-05-10 DIAGNOSIS — D472 Monoclonal gammopathy: Secondary | ICD-10-CM | POA: Diagnosis not present

## 2023-05-10 DIAGNOSIS — F3341 Major depressive disorder, recurrent, in partial remission: Secondary | ICD-10-CM | POA: Diagnosis not present

## 2023-05-10 DIAGNOSIS — G4733 Obstructive sleep apnea (adult) (pediatric): Secondary | ICD-10-CM | POA: Diagnosis not present

## 2023-05-10 DIAGNOSIS — J301 Allergic rhinitis due to pollen: Secondary | ICD-10-CM | POA: Diagnosis not present

## 2023-05-10 DIAGNOSIS — D7589 Other specified diseases of blood and blood-forming organs: Secondary | ICD-10-CM | POA: Diagnosis not present

## 2023-06-26 DIAGNOSIS — Z1231 Encounter for screening mammogram for malignant neoplasm of breast: Secondary | ICD-10-CM | POA: Diagnosis not present

## 2023-07-10 DIAGNOSIS — F338 Other recurrent depressive disorders: Secondary | ICD-10-CM | POA: Diagnosis not present

## 2023-09-05 ENCOUNTER — Encounter: Payer: Self-pay | Admitting: Orthopedic Surgery

## 2023-09-05 ENCOUNTER — Other Ambulatory Visit (INDEPENDENT_AMBULATORY_CARE_PROVIDER_SITE_OTHER): Payer: Commercial Managed Care - HMO

## 2023-09-05 ENCOUNTER — Ambulatory Visit: Payer: Commercial Managed Care - HMO | Admitting: Orthopedic Surgery

## 2023-09-05 ENCOUNTER — Other Ambulatory Visit (INDEPENDENT_AMBULATORY_CARE_PROVIDER_SITE_OTHER): Payer: Self-pay

## 2023-09-05 DIAGNOSIS — M545 Low back pain, unspecified: Secondary | ICD-10-CM | POA: Diagnosis not present

## 2023-09-05 DIAGNOSIS — M549 Dorsalgia, unspecified: Secondary | ICD-10-CM

## 2023-09-05 DIAGNOSIS — G8929 Other chronic pain: Secondary | ICD-10-CM

## 2023-09-07 ENCOUNTER — Encounter: Payer: Self-pay | Admitting: Orthopedic Surgery

## 2023-09-07 NOTE — Progress Notes (Signed)
Office Visit Note   Patient: Mary Morrison           Date of Birth: 01/22/1966           MRN: [id number] Visit Date: 09/05/2023 Requested by: Laurann Montana, MD [phone] [place],  [place] [address] PCP: Laurann Montana, MD  Subjective: Chief Complaint  Patient presents with   Other    Middle and low back pain    HPI: Mary Morrison is a 58 y.o. female who presents to the office reporting multiple orthopedic complaints.  She did have a right glenohumeral joint injection on 02/22/2023 without relief.  She has had cervical spine thoracic spine and lumbar spine radiographs which show degenerative changes throughout.  She states she has had a couple falls and is injured her back.  Reports pain in her thoracic spine as well as lower spine.  Patient states she has had 2 crawl to the toilet at times.  She notices a pop in her middle back with certain movements.  Hard for her to stand up straight.  Describes pain in the right lower back which radiates around to the stomach and abdominal region.  States she does not have good balance.  Hard for her to go from sitting to standing.  Her balance and weakness issues have been going on for over 3 months.  She has tried over-the-counter medication as well as a home exercise program which she used with her total knee replacement recovery.  None of those have been effective..                ROS: All systems reviewed are negative as they relate to the chief complaint within the history of present illness.  Patient denies fevers or chills.  Assessment & Plan: Visit Diagnoses:  1. Mid back pain   2. Chronic right-sided low back pain, unspecified whether sciatica present     Plan: Impression is possible right-sided radiculopathy affecting her arm.  Glenohumeral injection was not helpful.  She has degenerative changes throughout her cervical thoracic and lumbar spine.  She is having some weakness issues with decreased hip flexion  strength on the right and decreased strength in that right leg.  This may or may not be related to any axial spine pathology.  Because of her history of falls I think he would be indicated for cervical thoracic and lumbar spine surgery for right-sided radiculopathy affecting the arm and right-sided weakness affecting her hip flexion.  Follow-up after those studies.  Follow-Up Instructions: No follow-ups on file.   Orders:  Orders Placed This Encounter  Procedures   XR Thoracic Spine 2 View   XR Lumbar Spine 2-3 Views   MR Cervical Spine w/o contrast   MR Thoracic Spine w/o contrast   MR Lumbar Spine w/o contrast   No orders of the defined types were placed in this encounter.     Procedures: No procedures performed   Clinical Data: No additional findings.  Objective: Vital Signs: LMP 02/10/2014   Physical Exam:  Constitutional: Patient appears well-developed HEENT:  Head: Normocephalic Eyes:EOM are normal Neck: Normal range of motion Cardiovascular: Normal rate Pulmonary/chest: Effort normal Neurologic: Patient is alert Skin: Skin is warm Psychiatric: Patient has normal mood and affect  Ortho Exam: Ortho exam demonstrates 5 out of 5 ankle dorsiflexion plantarflexion quad hamstring strength although on the right-hand side she does have decreased hip flexion strength is 4-5 on the right compared to 5 out of 5  on the left.  Leg extension strength is slightly weaker on the right to manual muscle testing compared to the left.  No definite paresthesias L1 S1 bilaterally.  Does have focal pain in that T7-T8 area in the thoracic spine.  No groin pain with internal/external rotation of either leg.  She has pretty reasonable rotator cuff strength and shoulder range of motion with cervical spine flexion chin to chest extension about 30 degrees with rotation 50 to 60 degrees bilaterally.  No paresthesias C5-T1 in the upper extremities.  Specialty Comments:  No specialty comments  available.  Imaging: No results found.   PMFS History: Patient Active Problem List   Diagnosis Date Noted   Alcohol abuse, uncomplicated 01/02/2022   Allergic rhinitis due to pollen 01/02/2022   Chronic fatigue syndrome 01/02/2022   Insomnia 01/02/2022   Exercise-induced asthma 01/02/2022   Fatty liver 01/02/2022   Gastroesophageal reflux disease without esophagitis 01/02/2022   Lymphedema of leg 01/02/2022   Macrocytosis 01/02/2022   Monoclonal paraproteinemia 01/02/2022   Obstructive sleep apnea (adult) (pediatric) 01/02/2022   Recurrent major depression in remission (HCC) 01/02/2022   Sleep disorder 01/02/2022   Eating disorder 07/07/2021   Dyspnea on exertion 06/20/2021   Essential hypertension 06/20/2021   Arthritis of right knee 07/10/2019   Arthritis of knee 07/03/2017   Varicose veins of bilateral lower extremities with other complications 03/20/2017   Acute pain of left knee 01/25/2017   Swelling of left knee joint 01/25/2017   S/P hysterectomy 09/21/2014   Lapband APS July 2015 02/17/2014   Edema-chronic pitting in legs 01/20/2014   Morbid obesity (HCC) 08/28/2013   Past Medical History:  Diagnosis Date   ADHD    Alcohol abuse    Anxiety    Arthritis    Asthma    EXERCISED INDUCED 2014    Depression    Drug use    Edema, lower extremity    Fatty liver    Fibroids    High blood pressure    Obesity    Seasonal allergies    Sleep apnea    SOB (shortness of breath)    Varicose vein of leg    Vitamin B 12 deficiency     Family History  Problem Relation Age of Onset   Cancer Mother        breast/bilateral mastectomies   High blood pressure Mother    Depression Mother    Heart disease Mother    Anxiety disorder Mother    Obesity Mother    Alcoholism Father    Diabetes Other    Hypertension Other    Stroke Other    Obesity Other     Past Surgical History:  Procedure Laterality Date   BILATERAL SALPINGECTOMY Bilateral 09/21/2014   Procedure:  BILATERAL SALPINGECTOMY;  Surgeon: Serita Kyle, MD;  Location: [facility];  Service: Gynecology;  Laterality: Bilateral;   CESAREAN SECTION  1989   COLONOSCOPY     LAPAROSCOPIC GASTRIC BANDING N/A 02/17/2014   Procedure: LAPAROSCOPIC GASTRIC BANDING;  Surgeon: Valarie Merino, MD;  Location: [facility];  Service: General;  Laterality: N/A;   ROBOT ASSISTED MYOMECTOMY N/A 09/21/2014   Procedure: ROBOTIC ASSISTED MYOMECTOMY;  Surgeon: Serita Kyle, MD;  Location: [facility];  Service: Gynecology;  Laterality: N/A;   ROBOTIC ASSISTED TOTAL HYSTERECTOMY N/A 09/21/2014   Procedure: ROBOTIC ASSISTED TOTAL HYSTERECTOMY, EXCISION OF PELVIC ENDOMETRIOSIS;  Surgeon: Serita Kyle, MD;  Location: [facility];  Service: Gynecology;  Laterality: N/A;  TONSILLECTOMY     TOTAL KNEE ARTHROPLASTY Left 07/03/2017   Procedure: LEFT TOTAL KNEE ARTHROPLASTY;  Surgeon: Cammy Copa, MD;  Location: [facility];  Service: Orthopedics;  Laterality: Left;   TOTAL KNEE ARTHROPLASTY Right 07/10/2019   Procedure: RIGHT TOTAL KNEE ARTHROPLASTY;  Surgeon: Cammy Copa, MD;  Location: [facility];  Service: Orthopedics;  Laterality: Right;   Social History   Occupational History   Occupation: Self employed  Tobacco Use   Smoking status: Never   Smokeless tobacco: Never  Vaping Use   Vaping status: Never Used  Substance and Sexual Activity   Alcohol use: No   Drug use: No   Sexual activity: Not Currently    Birth control/protection: None

## 2023-09-21 ENCOUNTER — Other Ambulatory Visit: Payer: Commercial Managed Care - HMO

## 2023-09-21 ENCOUNTER — Ambulatory Visit
Admission: RE | Admit: 2023-09-21 | Discharge: 2023-09-21 | Disposition: A | Discharge status: Home / Self Care | Payer: Commercial Managed Care - HMO | Source: Ambulatory Visit | Attending: Orthopedic Surgery | Admitting: Orthopedic Surgery

## 2023-09-21 DIAGNOSIS — M549 Dorsalgia, unspecified: Secondary | ICD-10-CM

## 2023-09-21 DIAGNOSIS — G8929 Other chronic pain: Secondary | ICD-10-CM

## 2023-10-02 ENCOUNTER — Telehealth: Payer: Self-pay | Admitting: Orthopedic Surgery

## 2023-10-02 NOTE — Telephone Encounter (Signed)
 Patient called. Says she needs something for pain. Would like someone to call her. [phone]

## 2023-10-05 ENCOUNTER — Other Ambulatory Visit: Payer: Self-pay | Admitting: Orthopedic Surgery

## 2023-10-05 MED ORDER — TRAMADOL HCL 50 MG PO TABS: 50.0000 mg | ORAL_TABLET | Freq: Three times a day (TID) | ORAL | 0 refills | Status: DC | PRN

## 2023-10-05 NOTE — Telephone Encounter (Signed)
Tramadol sent. 

## 2023-10-24 ENCOUNTER — Telehealth: Payer: Self-pay

## 2023-10-24 NOTE — Telephone Encounter (Signed)
 The patient is asking for [place] to try again to get the MRIs of the Tsp & Lsp approved. She has tried 6 weeks of conservative therapy, and the pain level/functional ability is no better. The Tramadol has not helped the pain.   She was able to have the Csp MRI, but has not gotten the results (from 09/21/23) yet. She is asking for that.  The requests for the MRIs would need to be entered again.  The patient can be reached at [phone]. She does have MyChart, as well.

## 2023-10-29 ENCOUNTER — Telehealth: Payer: Self-pay | Admitting: Radiology

## 2023-10-29 DIAGNOSIS — M549 Dorsalgia, unspecified: Secondary | ICD-10-CM

## 2023-10-29 DIAGNOSIS — G8929 Other chronic pain: Secondary | ICD-10-CM

## 2023-10-29 NOTE — Telephone Encounter (Signed)
 Patient left voicemail wanting to check on status of trying to get approval for the two MRI's ( Thoracic Spine and Lumbar Spine) which were previously denied. She does not feel like the tramadol is helping at all and is unable to get much relief if any. Per previous phone message, orders would have to be resubmitted for [place] to try for authorization as patient has now completed six weeks of conservative therapy.  Please advise.  OK to resubmit?

## 2023-10-29 NOTE — Telephone Encounter (Signed)
 I called and left message on her machine.  I do not think the C-spine MRI findings really account for much of her symptoms.  T-spine and L-spine please resubmit as she has completed 6 weeks of therapy and taken tramadol.  Thanks

## 2023-10-29 NOTE — Telephone Encounter (Signed)
 Yes  thx

## 2023-10-30 NOTE — Telephone Encounter (Signed)
 Orders re-entered. I left voicemail for patient advising.

## 2023-10-30 NOTE — Addendum Note (Signed)
 Addended by: Rogers Seeds on: 10/30/2023 08:47 AM   Modules accepted: Orders

## 2023-11-08 ENCOUNTER — Encounter: Payer: Self-pay | Admitting: Orthopedic Surgery

## 2023-11-14 ENCOUNTER — Other Ambulatory Visit: Payer: Self-pay | Admitting: Family Medicine

## 2023-11-14 DIAGNOSIS — R531 Weakness: Secondary | ICD-10-CM

## 2023-11-28 ENCOUNTER — Encounter: Payer: Self-pay | Admitting: Family Medicine

## 2023-12-04 ENCOUNTER — Ambulatory Visit
Admission: RE | Admit: 2023-12-04 | Discharge: 2023-12-04 | Disposition: A | Discharge status: Home / Self Care | Source: Ambulatory Visit | Attending: Orthopedic Surgery | Admitting: Orthopedic Surgery

## 2023-12-04 DIAGNOSIS — M545 Low back pain, unspecified: Secondary | ICD-10-CM

## 2023-12-04 DIAGNOSIS — M549 Dorsalgia, unspecified: Secondary | ICD-10-CM

## 2023-12-19 ENCOUNTER — Encounter: Payer: Self-pay | Admitting: Orthopedic Surgery

## 2023-12-19 ENCOUNTER — Ambulatory Visit: Admitting: Orthopedic Surgery

## 2023-12-19 ENCOUNTER — Ambulatory Visit: Payer: Self-pay | Admitting: Orthopedic Surgery

## 2023-12-19 DIAGNOSIS — M79661 Pain in right lower leg: Secondary | ICD-10-CM

## 2023-12-19 NOTE — Progress Notes (Signed)
 I called and left message.  She does have some right-sided T12 nerve compression which could be theoretically causing a little bit of that hip flexor weakness.  I think osteomyelitis is less likely but it would be worth having her see Dr. Sulema Endo sometime within the next few weeks and then also when she comes in for therapy can you draw some labs on her which would be sed rate C-reactive protein and CBC DIF just as a nurse only visit like when she is coming in for therapy.  Thanks

## 2023-12-19 NOTE — Progress Notes (Signed)
 Office Visit Note   Patient: Mary Morrison           Date of Birth: Jul 29, 1965           MRN: [id number] Visit Date: 12/19/2023 Requested by: Victorio Grave, MD [phone] [address] [place],  [place] [address] PCP: Victorio Grave, MD  Subjective: Chief Complaint  Patient presents with   Neck - Follow-up   Middle Back - Follow-up   Lower Back - Follow-up    HPI: Mary Morrison is a 58 y.o. female who presents to the office reporting continued midthoracic pain as well as right leg and lower back pain.  Having some hip flexor weakness as well.  Occasional difficulty getting up and down stairs.  Since her last visit she has had MRI of the lumbar and thoracic spine.                ROS: All systems reviewed are negative as they relate to the chief complaint within the history of present illness.  Patient denies fevers or chills.  Assessment & Plan: Visit Diagnoses:  1. Pain in right lower leg     Plan: Impression is on review of the imaging studies she has some arthritis in the lumbar spine.  A little bit of edema at T12 and L1.  Nothing really significantly pressing on any of the right sided nerve roots on my review.  Certainly degenerative changes are present.  However in the mid thoracic region and patient does have a disc which I think accounts for some of her midthoracic pain.  Plan at this time is physical therapy for core strengthening.  She has 5- out of 5 hip flexor weakness on the right compared to the left side like her to see neurology for an EMG nerve study on that side.  Also plan for her to see Dr. Daisey Dryer for consideration of both thoracic ESI as well as lumbar spine ESI.  Follow-Up Instructions: No follow-ups on file.   Orders:  Orders Placed This Encounter  Procedures   Ambulatory referral to Physical Therapy   Ambulatory referral to Physical Medicine Rehab   Ambulatory referral to Neurology   No orders of the defined types were placed in this  encounter.     Procedures: No procedures performed   Clinical Data: No additional findings.  Objective: Vital Signs: LMP 02/10/2014   Physical Exam:  Constitutional: Patient appears well-developed HEENT:  Head: Normocephalic Eyes:EOM are normal Neck: Normal range of motion Cardiovascular: Normal rate Pulmonary/chest: Effort normal Neurologic: Patient is alert Skin: Skin is warm Psychiatric: Patient has normal mood and affect  Ortho Exam: Ortho exam demonstrates good knee range of motion.  She does have some hip flexor weakness on the right at 5- out of 5 compared to the left at 5 out of 5.  Otherwise her ankle dorsiflexion plantarflexion quad and hamstring strength is fairly symmetric.  She has excellent flexibility and actually feels better when she stretches her back out in flexion as opposed to extension.  No definite paresthesias L1-S1 bilaterally.  Specialty Comments:  No specialty comments available.  Imaging: No results found.   PMFS History: Patient Active Problem List   Diagnosis Date Noted   Alcohol abuse, uncomplicated 01/02/2022   Allergic rhinitis due to pollen 01/02/2022   Chronic fatigue syndrome 01/02/2022   Insomnia 01/02/2022   Exercise-induced asthma 01/02/2022   Fatty liver 01/02/2022   Gastroesophageal reflux disease without esophagitis 01/02/2022   Lymphedema of leg  01/02/2022   Macrocytosis 01/02/2022   Monoclonal paraproteinemia 01/02/2022   Obstructive sleep apnea (adult) (pediatric) 01/02/2022   Recurrent major depression in remission (HCC) 01/02/2022   Sleep disorder 01/02/2022   Eating disorder 07/07/2021   Dyspnea on exertion 06/20/2021   Essential hypertension 06/20/2021   Arthritis of right knee 07/10/2019   Arthritis of knee 07/03/2017   Varicose veins of bilateral lower extremities with other complications 03/20/2017   Acute pain of left knee 01/25/2017   Swelling of left knee joint 01/25/2017   S/P hysterectomy 09/21/2014    Lapband APS July 2015 02/17/2014   Edema-chronic pitting in legs 01/20/2014   Morbid obesity (HCC) 08/28/2013   Past Medical History:  Diagnosis Date   ADHD    Alcohol abuse    Anxiety    Arthritis    Asthma    EXERCISED INDUCED 2014    Depression    Drug use    Edema, lower extremity    Fatty liver    Fibroids    High blood pressure    Obesity    Seasonal allergies    Sleep apnea    SOB (shortness of breath)    Varicose vein of leg    Vitamin B 12 deficiency     Family History  Problem Relation Age of Onset   Cancer Mother        breast/bilateral mastectomies   High blood pressure Mother    Depression Mother    Heart disease Mother    Anxiety disorder Mother    Obesity Mother    Alcoholism Father    Diabetes Other    Hypertension Other    Stroke Other    Obesity Other     Past Surgical History:  Procedure Laterality Date   BILATERAL SALPINGECTOMY Bilateral 09/21/2014   Procedure: BILATERAL SALPINGECTOMY;  Surgeon: Kandra Orn, MD;  Location: [facility];  Service: Gynecology;  Laterality: Bilateral;   CESAREAN SECTION  1989   COLONOSCOPY     LAPAROSCOPIC GASTRIC BANDING N/A 02/17/2014   Procedure: LAPAROSCOPIC GASTRIC BANDING;  Surgeon: Azucena Bollard, MD;  Location: [facility];  Service: General;  Laterality: N/A;   ROBOT ASSISTED MYOMECTOMY N/A 09/21/2014   Procedure: ROBOTIC ASSISTED MYOMECTOMY;  Surgeon: Kandra Orn, MD;  Location: [facility];  Service: Gynecology;  Laterality: N/A;   ROBOTIC ASSISTED TOTAL HYSTERECTOMY N/A 09/21/2014   Procedure: ROBOTIC ASSISTED TOTAL HYSTERECTOMY, EXCISION OF PELVIC ENDOMETRIOSIS;  Surgeon: Kandra Orn, MD;  Location: [facility];  Service: Gynecology;  Laterality: N/A;   TONSILLECTOMY     TOTAL KNEE ARTHROPLASTY Left 07/03/2017   Procedure: LEFT TOTAL KNEE ARTHROPLASTY;  Surgeon: Jasmine Mesi, MD;  Location: [facility] OR;  Service: Orthopedics;  Laterality: Left;   TOTAL KNEE ARTHROPLASTY Right 07/10/2019    Procedure: RIGHT TOTAL KNEE ARTHROPLASTY;  Surgeon: Jasmine Mesi, MD;  Location: [facility] OR;  Service: Orthopedics;  Laterality: Right;   Social History   Occupational History   Occupation: Self employed  Tobacco Use   Smoking status: Never   Smokeless tobacco: Never  Vaping Use   Vaping status: Never Used  Substance and Sexual Activity   Alcohol use: No   Drug use: No   Sexual activity: Not Currently    Birth control/protection: None

## 2023-12-19 NOTE — Addendum Note (Signed)
 Addended by: Kaio Kuhlman on: 12/19/2023 07:56 AM   Modules accepted: Orders

## 2023-12-20 NOTE — Telephone Encounter (Signed)
 Patient needs nurse only visit for labs

## 2023-12-20 NOTE — Telephone Encounter (Signed)
-----   Message from Marykay Snipes sent at 12/19/2023  7:58 AM EDT ----- I called and left message.  She does have some right-sided T12 nerve compression which could be theoretically causing a little bit of that hip flexor weakness.  I think osteomyelitis is less likely but it would be worth having her see Dr. Sulema Endo somet ime within the next few weeks and then also when she comes in for therapy can you draw some labs on her which would be sed rate C-reactive protein and CBC DIF just as a nurse only visit like when she is coming in for therapy.  Thanks

## 2023-12-21 NOTE — Progress Notes (Signed)
 Lvm for pt to cb

## 2023-12-21 NOTE — Progress Notes (Signed)
 Called and scheduled this for monday

## 2023-12-24 ENCOUNTER — Ambulatory Visit (INDEPENDENT_AMBULATORY_CARE_PROVIDER_SITE_OTHER)

## 2023-12-24 DIAGNOSIS — M79661 Pain in right lower leg: Secondary | ICD-10-CM

## 2023-12-24 NOTE — Progress Notes (Signed)
Patient here for lab draw only.

## 2023-12-25 LAB — CBC WITH DIFFERENTIAL/PLATELET
Absolute Lymphocytes: 1169 {cells}/uL (ref 850–3900)
Absolute Monocytes: 440 {cells}/uL (ref 200–950)
Basophils Absolute: 19 {cells}/uL (ref 0–200)
Basophils Relative: 0.5 %
Eosinophils Absolute: 130 {cells}/uL (ref 15–500)
Eosinophils Relative: 3.5 %
HCT: 42.7 % (ref 35.0–45.0)
Hemoglobin: 14 g/dL (ref 11.7–15.5)
MCH: 34.8 pg — ABNORMAL HIGH (ref 27.0–33.0)
MCHC: 32.8 g/dL (ref 32.0–36.0)
MCV: 106.2 fL — ABNORMAL HIGH (ref 80.0–100.0)
MPV: 8.7 fL (ref 7.5–12.5)
Monocytes Relative: 11.9 %
Neutro Abs: 1943 {cells}/uL (ref 1500–7800)
Neutrophils Relative %: 52.5 %
Platelets: 211 10*3/uL (ref 140–400)
RBC: 4.02 10*6/uL (ref 3.80–5.10)
RDW: 13.2 % (ref 11.0–15.0)
Total Lymphocyte: 31.6 %
WBC: 3.7 10*3/uL — ABNORMAL LOW (ref 3.8–10.8)

## 2023-12-25 LAB — C-REACTIVE PROTEIN: CRP: <3 mg/L (ref <8.0)

## 2024-01-03 ENCOUNTER — Encounter: Admitting: Orthopedic Surgery

## 2024-01-05 ENCOUNTER — Ambulatory Visit
Admission: RE | Admit: 2024-01-05 | Discharge: 2024-01-05 | Disposition: A | Discharge status: Home / Self Care | Source: Ambulatory Visit | Attending: Family Medicine | Admitting: Family Medicine

## 2024-01-05 DIAGNOSIS — R531 Weakness: Secondary | ICD-10-CM

## 2024-01-05 MED ORDER — GADOPICLENOL 0.5 MMOL/ML IV SOLN
10.0000 mL | Freq: Once | INTRAVENOUS | Status: AC | PRN
  Administered 2024-01-05: 10 mL via INTRAVENOUS

## 2024-01-07 ENCOUNTER — Ambulatory Visit: Admitting: Physical Medicine and Rehabilitation

## 2024-01-07 ENCOUNTER — Encounter: Payer: Self-pay | Admitting: Physical Medicine and Rehabilitation

## 2024-01-07 DIAGNOSIS — G8929 Other chronic pain: Secondary | ICD-10-CM | POA: Diagnosis not present

## 2024-01-07 DIAGNOSIS — M546 Pain in thoracic spine: Secondary | ICD-10-CM | POA: Diagnosis not present

## 2024-01-07 DIAGNOSIS — M5416 Radiculopathy, lumbar region: Secondary | ICD-10-CM

## 2024-01-07 DIAGNOSIS — M5441 Lumbago with sciatica, right side: Secondary | ICD-10-CM | POA: Diagnosis not present

## 2024-01-07 NOTE — Progress Notes (Signed)
 Core Outcome Measures Index (COMI) Back Score  Average Pain 6  COMI Score 60 %

## 2024-01-07 NOTE — Progress Notes (Signed)
 Mary Morrison - 58 y.o. female MRN [id number]  Date of birth: [birth year]  Office Visit Note: Visit Date: 01/07/2024 PCP: Victorio Grave, MD Referred by: Victorio Grave, MD  Subjective: Chief Complaint  Patient presents with   Middle Back - Pain   Lower Back - Pain   HPI: Mary Morrison is a 58 y.o. female who comes in today per the request of Dr. Lynita Saris for evaluation of chronic, worsening and severe bilateral lower back pain, more upper lumbar region radiating to right lateral hip and down anterior and medial aspect of leg to ankle. Also reports numbness/tingling to right leg and foot. Also reports extreme sensitivity to right anterior and medial aspects of thighs, she reports pain to these regions with only light touch. Pain ongoing for over a year, worsens with activity and movement. She describes pain as sore and aching sensation, feels like her back is being compressed. States leaning forward tends to help alleviate her pain. Some relief of pain with home exercise regimen, rest and use of medications. Some relief of pain with Tramadol . Dr. Rozelle Corning did place order for short course of formal physical therapy. Recent lumbar MRI imaging shows abnormal T12-L1 disc and endplate changes asymmetric to the right with mixed marrow signal including some marrow edema. There is also moderate to severe right T12 neural foraminal stenosis. There is mild central canal stenosis at L3-L4, moderate to severe right L5 foraminal stenosis. No high grade spinal canal stenosis noted. Dr. Rozelle Corning also placed order for her to have right lower extremity nerve study with [facility]. States she has not received call from this office. Patient denies recent trauma or falls.   Also reports chronic intermittent bilateral thoracic back pain. Middle back pain ongoing for over a year as well. She contributes onset of this pain after multiple falls over the last year. No specific injuries with falls.  Her lower back pain issues tend to be more severe for her, middle back pain is something she deals with intermittently.     Review of Systems  Musculoskeletal:  Positive for back pain and myalgias.  Neurological:  Positive for tingling and weakness.  All other systems reviewed and are negative.  Otherwise per HPI.  Assessment & Plan: Visit Diagnoses:    ICD-10-CM   1. Chronic bilateral low back pain with right-sided sciatica  G89.29 Ambulatory referral to Physical Medicine Rehab   M54.41     2. Lumbar radiculopathy  M54.16 Ambulatory referral to Physical Medicine Rehab    3. Chronic bilateral thoracic back pain  M54.6 Ambulatory referral to Physical Medicine Rehab   G89.29        Plan: Findings:  Chronic, worsening and severe bilateral lower back pain, more upper lumbar region radiating to right lateral hip and down anterior and medial aspect of leg to ankle. I discussed recent thoracic and lumbar MRI with her today using imaging and spine model. She does have some weakness with right hip flexion, difficult to tell if this is true weakness or more from pain. Her symptoms do not directly correlate with recent thoracic and lumbar MRI imaging. Her symptoms are more of L3 and L4 nerve patterns. The majority of her pain runs down medial aspect of right leg to ankle. No pain noted with internal/external rotation of right hip. She does have allodynia type symptoms upon light touch of bilateral anterior and medial aspects of thighs. We discussed her treatment plan in detail today. Next step is to perform  diagnostic and hopefully therapeutic right L3-L4 interlaminar epidural steroid injection under fluoroscopic guidance. I discussed injection procedure in detail today, she has no questions at this time. I encouraged her to call [facility] to schedule nerve testing of right lower extremity. We will see her back for injection. No red flag symptoms noted upon exam today. I did  discuss her case with my colleague Dr. Colette Davies, she is scheduled to follow up with him in the coming weeks for evaluation.        Meds & Orders: No orders of the defined types were placed in this encounter.   Orders Placed This Encounter  Procedures   Ambulatory referral to Physical Medicine Rehab    Follow-up: Return for Right L3-L4 interlaminar epidural steroid injection.   Procedures: No procedures performed      Clinical History: EXAM: MRI LUMBAR SPINE WITHOUT CONTRAST   Previous lumbar MRI 02/26/2017.   FINDINGS: Segmentation: Normal, concordant with the thoracic MRI reported separately.   Alignment: Mild dextroconvex lumbar scoliosis seems increased since the 2018 MRI. Relatively stable lumbar lordosis.   Vertebrae: Abnormal discs and endplate changes now at T12-L1 and L4-L5, new since the 2018 MRI.   At L4-L5 there is chronic appearing disc space loss and endplate degeneration with no marrow edema. See additional details of that level below.   At T12-L1 there is conspicuous increased T2 and STIR signal throughout much of the disc space, with bulky surrounding endplate spurring and heterogeneous seemingly acute and chronic marrow signal changes with patchy marrow edema visible on series 108, image 8 in both the T12 and L1 bodies. However, there seems to be developing interbody bridging bone also, flowing peripheral endplate osteophytes, and there is no surrounding paraspinal soft tissue inflammation identified. See additional findings at that level below.   No other marrow edema or acute osseous abnormality. Intact visible sacrum and SI joints.   Conus medullaris and cauda equina: Conus extends to the L1 level. No lower spinal cord or conus signal abnormality.   Paraspinal and other soft tissues: Stable and negative.   Disc levels:   T12-L1: Abnormal disc and endplate findings as stated above. Subsequent circumferential disc osteophyte complex  with a broad-based posterior component asymmetric to the right. Moderate facet hypertrophy also on that side, mild on the left. New spinal stenosis here since 2018 with up to mild mass effect on the conus. No conus signal abnormality. Moderate to severe multifactorial right T12 foraminal stenosis (series 106, image 7).   L1-L2:  This level is largely stable and negative.   L2-L3:  Also largely stable and negative.   L3-L4: Mild circumferential disc bulging here since 2018. Up to moderate facet and ligament flavum hypertrophy has developed with degenerative facet joint fluid. Increased epidural lipomatosis. Mild new spinal stenosis (series 114, image 29). Mild bilateral L3 foraminal stenosis is new.   L4-L5: Moderate to severe disc space loss since 2018. Circumferential disc osteophyte complex asymmetric to the left. Mild to moderate facet and ligament flavum hypertrophy greater on that side. But no significant spinal or lateral recess stenosis. Moderate left L4 foraminal stenosis.   L5-S1: Mild disc space loss and rightward circumferential disc bulge and endplate spurring since 2018. Increased and moderate facet hypertrophy. No significant spinal or lateral recess stenosis. Moderate to severe right L5 foraminal stenosis is progressed.   IMPRESSION: 1. Abnormal T12-L1 disc and endplate changes asymmetric to the right with mixed marrow signal including some marrow edema. However, there also appears  to be developing interbody ankylosis here, and there is no surrounding soft tissue inflammation. This level was normal in 2018. The constellation favors a chronic/healing discitis osteomyelitis. However, a hyperacute discitis osteomyelitis would be difficult to exclude - in which case a short interval repeat lumbar MRI two weeks would be valuable to document stability. There is associated mild spinal stenosis with mild mass effect on the conus, and moderate to severe right T12 neural  foraminal stenosis. No conus signal abnormality.   2. Substantially advanced disc and endplate degeneration since 2018 also at L4-L5, appears chronic. Moderate left L4 neural foraminal stenosis results.   3. Less severe L3-L4 progressed degeneration with new mild multifactorial spinal and bilateral foraminal stenosis there.   4. Progressed L5-S1 degeneration on the right with multifactorial increased and now moderate to severe right L5 neural foraminal stenosis.    On: 12/19/2023 07:47   She reports that she has never smoked. She has never used smokeless tobacco. No results for input(s): HGBA1C, LABURIC in the last 8760 hours.  Objective:  VS:  HT:    WT:   BMI:     BP:   HR: bpm  TEMP: ( )  RESP:  Physical Exam Vitals and nursing note reviewed.  HENT:     Head: Normocephalic and atraumatic.     Right Ear: External ear normal.     Left Ear: External ear normal.     Nose: Nose normal.     Mouth/Throat:     Mouth: Mucous membranes are moist.   Eyes:     Extraocular Movements: Extraocular movements intact.    Cardiovascular:     Rate and Rhythm: Normal rate.     Pulses: Normal pulses.  Pulmonary:     Effort: Pulmonary effort is normal.  Abdominal:     General: Abdomen is flat. There is no distension.   Musculoskeletal:        General: Tenderness present.     Cervical back: Normal range of motion.     Comments: Patient rises from seated position to standing without difficulty. Good lumbar range of motion. No pain noted with facet loading. 5/5 strength noted with left hip flexion, knee flexion/extension, ankle dorsiflexion/plantarflexion and EHL. 5-/5 weakness with right hip flexion compared to the left. No clonus noted bilaterally. No pain upon palpation of greater trochanters. No pain with internal/external rotation of bilateral hips. Sensation intact bilaterally. Negative slump test bilaterally. Ambulates without aid, gait steady.      Skin:    General: Skin  is warm and dry.     Capillary Refill: Capillary refill takes less than 2 seconds.   Neurological:     General: No focal deficit present.     Mental Status: She is alert and oriented to person, place, and time.   Psychiatric:        Mood and Affect: Mood normal.        Behavior: Behavior normal.     Ortho Exam  Imaging: No results found.  Past Medical/Family/Surgical/Social History: Medications & Allergies reviewed per EMR, new medications updated. Patient Active Problem List   Diagnosis Date Noted   Alcohol abuse, uncomplicated 01/02/2022   Allergic rhinitis due to pollen 01/02/2022   Chronic fatigue syndrome 01/02/2022   Insomnia 01/02/2022   Exercise-induced asthma 01/02/2022   Fatty liver 01/02/2022   Gastroesophageal reflux disease without esophagitis 01/02/2022   Lymphedema of leg 01/02/2022   Macrocytosis 01/02/2022   Monoclonal paraproteinemia 01/02/2022   Obstructive sleep apnea (adult) (pediatric)  01/02/2022   Recurrent major depression in remission (HCC) 01/02/2022   Sleep disorder 01/02/2022   Eating disorder 07/07/2021   Dyspnea on exertion 06/20/2021   Essential hypertension 06/20/2021   Arthritis of right knee 07/10/2019   Arthritis of knee 07/03/2017   Varicose veins of bilateral lower extremities with other complications 03/20/2017   Acute pain of left knee 01/25/2017   Swelling of left knee joint 01/25/2017   S/P hysterectomy 09/21/2014   Lapband APS July 2015 02/17/2014   Edema-chronic pitting in legs 01/20/2014   Morbid obesity (HCC) 08/28/2013   Past Medical History:  Diagnosis Date   ADHD    Alcohol abuse    Anxiety    Arthritis    Asthma    EXERCISED INDUCED 2014    Depression    Drug use    Edema, lower extremity    Fatty liver    Fibroids    High blood pressure    Obesity    Seasonal allergies    Sleep apnea    SOB (shortness of breath)    Varicose vein of leg    Vitamin B 12 deficiency    Family History  Problem Relation  Age of Onset   Cancer Mother        breast/bilateral mastectomies   High blood pressure Mother    Depression Mother    Heart disease Mother    Anxiety disorder Mother    Obesity Mother    Alcoholism Father    Diabetes Other    Hypertension Other    Stroke Other    Obesity Other    Past Surgical History:  Procedure Laterality Date   BILATERAL SALPINGECTOMY Bilateral 09/21/2014   Procedure: BILATERAL SALPINGECTOMY;  Surgeon: Kandra Orn, MD;  Location: [facility];  Service: Gynecology;  Laterality: Bilateral;   CESAREAN SECTION  1989   COLONOSCOPY     LAPAROSCOPIC GASTRIC BANDING N/A 02/17/2014   Procedure: LAPAROSCOPIC GASTRIC BANDING;  Surgeon: Azucena Bollard, MD;  Location: [facility];  Service: General;  Laterality: N/A;   ROBOT ASSISTED MYOMECTOMY N/A 09/21/2014   Procedure: ROBOTIC ASSISTED MYOMECTOMY;  Surgeon: Kandra Orn, MD;  Location: [facility];  Service: Gynecology;  Laterality: N/A;   ROBOTIC ASSISTED TOTAL HYSTERECTOMY N/A 09/21/2014   Procedure: ROBOTIC ASSISTED TOTAL HYSTERECTOMY, EXCISION OF PELVIC ENDOMETRIOSIS;  Surgeon: Kandra Orn, MD;  Location: [facility];  Service: Gynecology;  Laterality: N/A;   TONSILLECTOMY     TOTAL KNEE ARTHROPLASTY Left 07/03/2017   Procedure: LEFT TOTAL KNEE ARTHROPLASTY;  Surgeon: Jasmine Mesi, MD;  Location: [facility];  Service: Orthopedics;  Laterality: Left;   TOTAL KNEE ARTHROPLASTY Right 07/10/2019   Procedure: RIGHT TOTAL KNEE ARTHROPLASTY;  Surgeon: Jasmine Mesi, MD;  Location: [facility] OR;  Service: Orthopedics;  Laterality: Right;   Social History   Occupational History   Occupation: Self employed  Tobacco Use   Smoking status: Never   Smokeless tobacco: Never  Vaping Use   Vaping status: Never Used  Substance and Sexual Activity   Alcohol use: No   Drug use: No   Sexual activity: Not Currently    Birth control/protection: None

## 2024-01-07 NOTE — Progress Notes (Signed)
 Pain Scale   Average Pain 7 Patient advising she has chronic mid to lower back pain that radiates to her right leg and it causes pain and also it is causing her to not be able to pick her right leg up. Patient advising at times she trips due to not picking her leg up correctly.        +Driver, -BT, -Dye Allergies.

## 2024-01-14 ENCOUNTER — Encounter: Payer: Self-pay | Admitting: Physical Therapy

## 2024-01-14 ENCOUNTER — Ambulatory Visit: Admitting: Physical Therapy

## 2024-01-14 DIAGNOSIS — M5459 Other low back pain: Secondary | ICD-10-CM

## 2024-01-14 DIAGNOSIS — M79604 Pain in right leg: Secondary | ICD-10-CM

## 2024-01-14 DIAGNOSIS — M6281 Muscle weakness (generalized): Secondary | ICD-10-CM | POA: Diagnosis not present

## 2024-01-14 NOTE — Therapy (Signed)
 OUTPATIENT PHYSICAL THERAPY EVALUATION   Patient Name: Mary Morrison MRN: [id number] DOB:12/22/1965, 58 y.o., female Today's Date: 01/14/2024  END OF SESSION:  PT End of Session - 01/14/24 1145     Visit Number 1    Number of Visits 4    Date for PT Re-Evaluation 03/10/24    Authorization Type [employer] $50 copay; 30 visit limit    PT Start Time 1142    PT Stop Time 1227    PT Time Calculation (min) 45 min    Activity Tolerance Patient tolerated treatment well;Patient limited by pain    Behavior During Therapy WFL for tasks assessed/performed          Past Medical History:  Diagnosis Date   ADHD    Alcohol abuse    Anxiety    Arthritis    Asthma    EXERCISED INDUCED 2014    Depression    Drug use    Edema, lower extremity    Fatty liver    Fibroids    High blood pressure    Obesity    Seasonal allergies    Sleep apnea    SOB (shortness of breath)    Varicose vein of leg    Vitamin B 12 deficiency    Past Surgical History:  Procedure Laterality Date   BILATERAL SALPINGECTOMY Bilateral 09/21/2014   Procedure: BILATERAL SALPINGECTOMY;  Surgeon: Dickie DELENA Carder, MD;  Location: [facility];  Service: Gynecology;  Laterality: Bilateral;   CESAREAN SECTION  1989   COLONOSCOPY     LAPAROSCOPIC GASTRIC BANDING N/A 02/17/2014   Procedure: LAPAROSCOPIC GASTRIC BANDING;  Surgeon: Donnice KATHEE Lunger, MD;  Location: [facility];  Service: General;  Laterality: N/A;   ROBOT ASSISTED MYOMECTOMY N/A 09/21/2014   Procedure: ROBOTIC ASSISTED MYOMECTOMY;  Surgeon: Dickie DELENA Carder, MD;  Location: [facility];  Service: Gynecology;  Laterality: N/A;   ROBOTIC ASSISTED TOTAL HYSTERECTOMY N/A 09/21/2014   Procedure: ROBOTIC ASSISTED TOTAL HYSTERECTOMY, EXCISION OF PELVIC ENDOMETRIOSIS;  Surgeon: Dickie DELENA Carder, MD;  Location: [facility];  Service: Gynecology;  Laterality: N/A;   TONSILLECTOMY     TOTAL KNEE ARTHROPLASTY Left 07/03/2017   Procedure: LEFT TOTAL KNEE ARTHROPLASTY;  Surgeon:  Addie Cordella Hamilton, MD;  Location: [facility];  Service: Orthopedics;  Laterality: Left;   TOTAL KNEE ARTHROPLASTY Right 07/10/2019   Procedure: RIGHT TOTAL KNEE ARTHROPLASTY;  Surgeon: Addie Cordella Hamilton, MD;  Location: [facility] OR;  Service: Orthopedics;  Laterality: Right;   Patient Active Problem List   Diagnosis Date Noted   Alcohol abuse, uncomplicated 01/02/2022   Allergic rhinitis due to pollen 01/02/2022   Chronic fatigue syndrome 01/02/2022   Insomnia 01/02/2022   Exercise-induced asthma 01/02/2022   Fatty liver 01/02/2022   Gastroesophageal reflux disease without esophagitis 01/02/2022   Lymphedema of leg 01/02/2022   Macrocytosis 01/02/2022   Monoclonal paraproteinemia 01/02/2022   Obstructive sleep apnea (adult) (pediatric) 01/02/2022   Recurrent major depression in remission (HCC) 01/02/2022   Sleep disorder 01/02/2022   Eating disorder 07/07/2021   Dyspnea on exertion 06/20/2021   Essential hypertension 06/20/2021   Arthritis of right knee 07/10/2019   Arthritis of knee 07/03/2017   Varicose veins of bilateral lower extremities with other complications 03/20/2017   Acute pain of left knee 01/25/2017   Swelling of left knee joint 01/25/2017   S/P hysterectomy 09/21/2014   Lapband APS July 2015 02/17/2014   Edema-chronic pitting in legs 01/20/2014   Morbid obesity (HCC) 08/28/2013    PCP: Teresa,  Montie, MD  REFERRING PROVIDER: Addie Cordella Hamilton, MD  REFERRING DIAG: [phone] (ICD-10-CM) - Pain in right lower leg  Rationale for Evaluation and Treatment: Rehabilitation  THERAPY DIAG:  Other low back pain - Plan: PT plan of care cert/re-cert  Pain in right leg - Plan: PT plan of care cert/re-cert  Muscle weakness (generalized) - Plan: PT plan of care cert/re-cert  ONSET DATE: chronic; over a year; exacerbation x 6 months   SUBJECTIVE:                                                                                                                                                                                            SUBJECTIVE STATEMENT: Pt reports she's had intermittent upper and lower back pain, but following a fall shortly after Christmas that the pain has gotten significantly worse.  She reports pain with sitting up straight after sleeping, and difficulty taking a deep breath.  She expresses RLE pain and weakness with lifting leg into bed or car.    PERTINENT HISTORY:   ADHD, anxiety, OA, depression, OSA, hx alcohol and drug use  PAIN:  Are you having pain? Yes: NPRS scale: 7-8 currently, up to 10, at best 4/10 Pain location: mid and low back; RLE Pain description: low back and RLE pain is constant, aching, sharp at times Aggravating factors: transitional movements, sitting up straight, driving >1 hour Relieving factors: flexion preference, heat, massage  PRECAUTIONS:  None  RED FLAGS: Bowel or bladder incontinence: Yes: reports increased incontinence, currently on a diuretic    WEIGHT BEARING RESTRICTIONS:  No  FALLS:  Has patient fallen in last 6 months? Yes. 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  DIAGNOSTIC FINDINGS:  lumbar MRI imaging shows abnormal T12-L1 disc and endplate changes asymmetric to the right with mixed marrow signal including some marrow edema. There is also moderate to severe right T12 neural foraminal stenosis. There is mild central canal stenosis at L3-L4, moderate to severe right L5 foraminal stenosis  PATIENT SURVEYS:  Patient-Specific  Activity Scoring Scheme  0 represents "unable to perform." 10 represents "able to perform at prior level. 0 1 2 3 4 5 6 7  8  9 10 (Date and Score)   Activity Eval     1. Making beds 0    2. Walking 4    3. Raising Rt leg 2   Score 2    Total score = sum of the activity scores/number of activities Minimum detectable change (90%CI) for average score = 2 points Minimum detectable change (90%CI) for single activity score = 3 points    COGNITIVE STATUS: Within functional limits for tasks assessed   SENSATION: C/O decreased sensation in RLE, some burning and tingling in RLE  POSTURE:  rounded shoulders and forward head   GAIT: 01/14/24 Comments: independent; slow with movements due to pain, difficulty with RLE clearance due to hip weakness   PALPATION: 01/14/24 tender to palpation Rt medial quad/adductors   LUMBAR ROM:   ROM A/PROM  eval  Flexion WNL  Extension WNL with pain  Right quadrant WNL with Rt side pain  Left quadrant WNL    (Blank rows = not tested)   LOWER EXTREMITY MMT:     ROM Right eval Left eval  Hip flexion 2/5 3+/5  Hip extension    Hip abduction    Hip adduction    Hip internal rotation    Hip external rotation    Knee flexion 5/5 5/5  Knee extension 5/5 5/5  Ankle dorsiflexion 4/5 5/5  Ankle plantarflexion    Ankle inversion    Ankle eversion     (Blank rows = not tested)   LOWER EXTREMITY MMT:    MMT Right eval Left eval  Hip flexion    Hip extension    Hip abduction    Hip adduction    Hip internal rotation    Hip external rotation    Knee flexion    Knee extension    Ankle dorsiflexion    Ankle plantarflexion    Ankle inversion    Ankle eversion     (Blank rows = not tested)    SPECIAL TESTS:  01/14/24 Lumbar Slump test: did report increase in symptoms on Rt; no change with decreased neural tension  TREATMENT:                                                                                                                               DATE:  01/14/24 TherEx See HEP - demonstrated with trial reps performed PRN, mod cues for comprehension    Self Care Educated on PT POC and clinical findings     PATIENT EDUCATION:  Education details: HEP Person educated: Patient Education method: Programmer, multimedia, Facilities manager, and Handouts Education comprehension: verbalized understanding, returned demonstration, and needs further education  HOME EXERCISE PROGRAM: Access Code: [email or web address] URL: [email or web address] Date: 01/14/2024 Prepared by: Corean Ku  Exercises - Supine Posterior Pelvic Tilt  - 2 x daily - 7 x weekly - 1 sets - 10 reps - 5 sec hold - Sidelying Hip Circles  - 1 x daily - 7 x weekly - 2  sets - 10 circles each way - Sidelying Bent Knee Hip Flexion  - 1 x daily - 7 x weekly - 2 sets - 10 reps - Supine March  - 1 x daily - 7 x weekly - 2 sets - 10 reps - [employer] on Counter  - 1 x daily - 7 x weekly - 2 sets - 10 reps - Sit to Stand  - 1 x daily - 7 x weekly - 2 sets - 10 reps - Seated Hip Adduction Isometrics with Ball  - 1 x daily - 7 x weekly - 2 sets - 10 reps - 5 sec hold   ASSESSMENT:  CLINICAL IMPRESSION: Patient is a 58 y.o. female who was seen today for physical therapy evaluation and treatment for LBP with radicular symptoms. She demonstrates decreased strength, postural abnormalities and continued pain affecting functional mobility.  She will benefit from PT to address deficits listed.     OBJECTIVE IMPAIRMENTS: Abnormal gait, decreased balance, decreased strength, impaired sensation, postural dysfunction, and pain.   ACTIVITY LIMITATIONS: carrying, lifting, sitting, standing, stairs, transfers, bed mobility, bathing, toileting, dressing, hygiene/grooming, and locomotion level  PARTICIPATION LIMITATIONS: meal prep, cleaning, laundry, driving, shopping, community activity, occupation, and yard work  PERSONAL FACTORS: 3+ comorbidities: ADHD,  anxiety, OA, depression, OSA, hx alcohol and drug use are also affecting patient's functional outcome.   REHAB POTENTIAL: Good  CLINICAL DECISION MAKING: Evolving/moderate complexity  EVALUATION COMPLEXITY: Moderate   GOALS: Goals reviewed with patient? Yes  SHORT TERM GOALS: Target date: 02/11/2024  Independent with initial HEP Goal status: INITIAL  2.  Demonstrate ability to perform lumbar ROM without increase in pain Goal status: INITIAL   LONG TERM GOALS: Target date: 03/10/2024  Independent with final HEP Goal status: INITIAL  2.  PSFS score improved by 2 points Goal status: INITIAL  3.  Rt hip flexion strength improved to 4/5 for improved bed mobility and car transfers Goal status: INIITAL  4.  Report pain < 3/10 with transitional movements for improved function Goal status: INITIAL   PLAN:  PT FREQUENCY: every other week  PT DURATION: 8 weeks  PLANNED INTERVENTIONS: 97164- PT Re-evaluation, 97750- Physical Performance Testing, 97110-Therapeutic exercises, 97530- Therapeutic activity, 97112- Neuromuscular re-education, 97535- Self Care, 02859- Manual therapy, [phone]- Gait training, [phone]- Electrical stimulation (unattended), 97035- Ultrasound, [id number]- Ionotophoresis 4mg /ml Dexamethasone , 79439 (1-2 muscles), [id number] (3+ muscles)- Dry Needling, Patient/Family education, Balance training, Stair training, Taping, Joint mobilization, Joint manipulation, Spinal manipulation, Spinal mobilization, Cryotherapy, and Moist heat.  PLAN FOR NEXT SESSION: Review HEP, work on hip/core strength, manual/modalities PRN   NEXT MD VISIT: Dr. Georgina and Injection: 02/07/24   Corean JULIANNA Ku, PT, DPT 01/14/24 12:52 PM

## 2024-01-23 ENCOUNTER — Telehealth: Payer: Self-pay | Admitting: Physical Medicine and Rehabilitation

## 2024-01-23 NOTE — Telephone Encounter (Signed)
 Pt wants to reschedule  her appointment and would like to have it on the same day as Dr Georgina 02/27/24 if possible. Please call

## 2024-02-07 ENCOUNTER — Encounter: Admitting: Orthopedic Surgery

## 2024-02-07 ENCOUNTER — Encounter: Admitting: Physical Medicine and Rehabilitation

## 2024-02-18 ENCOUNTER — Encounter: Admitting: Physical Medicine and Rehabilitation

## 2024-02-19 ENCOUNTER — Telehealth: Payer: Self-pay | Admitting: Physical Medicine and Rehabilitation

## 2024-02-19 NOTE — Telephone Encounter (Signed)
 Patient called and said she thought her appointment today for the injection not realizing it was yesterday. She was flying yesterday. Can she reschedule. CB#[phone]

## 2024-02-27 ENCOUNTER — Other Ambulatory Visit (INDEPENDENT_AMBULATORY_CARE_PROVIDER_SITE_OTHER): Payer: Self-pay

## 2024-02-27 ENCOUNTER — Ambulatory Visit: Admitting: Orthopedic Surgery

## 2024-02-27 VITALS — BP 129/93 | HR 80 | Ht 66.0 in | Wt 298.8 lb

## 2024-02-27 DIAGNOSIS — M5441 Lumbago with sciatica, right side: Secondary | ICD-10-CM | POA: Diagnosis not present

## 2024-02-27 DIAGNOSIS — G8929 Other chronic pain: Secondary | ICD-10-CM

## 2024-02-27 NOTE — Progress Notes (Signed)
 Orthopedic Spine Surgery Office Note  Assessment: Patient is a 58 y.o. female with lower lumbar back pain and right medial thigh pain   Plan: -Prior ESR and CRP were within normal limits and that was after the lumbar MRI was obtained.  Order repeat to ensure that they are still not consistent with discitis -Patient has tried PT, Tylenol , Aleve, tramadol  -Recommended weight loss and core strengthening.  She has an upcoming injection and I feel that that could be helpful as well -She has right inner thigh pain but stenosis on the left at L4/5.  So there is not a clear explanation of her leg symptoms from the spine -Would need to get down to a BMI of 40 or less prior to any elective spine surgery -Patient should return to office on an as-needed basis   Patient expressed understanding of the plan and all questions were answered to the patient's satisfaction.   ___________________________________________________________________________   History:  Patient is a 58 y.o. female who presents today for lumbar spine.  Patient has had pain in her low back for a long time now but it has gotten worse within the last couple of months.  She also has pain on the right inner thigh and developed some groin pain with rotation.  She describes pain when rotating her leg in and out of bed in the groin region.  She has no pain rating to the left lower extremity.  She has not noticed any relief with the conservative treatments tried so far.  There is no trauma or injury that preceded the onset of pain but does notice worsening of her low back pain if she has a fall.   Weakness: Yes, feels her right hip is weaker. No other weakness noted Symptoms of imbalance: Yes, at times feels off balance. Has had these symptoms for long time and no recent changes (MRI of the cervical and thoracic spine do not show any central stenosis that would be consistent with myelopathy)  Bowel or bladder incontinence: yes, has a history of  urinary incontinence. No recent changes in bowel or bladder habits Saddle anesthesia: denies  Treatments tried: PT, Tylenol , Aleve, tramadol   Review of systems: Denies fevers and chills, night sweats, unexplained weight loss, history of cancer, pain that wakes them at night  Past medical history: Malignant hyperthermia Depression/anxiety HTN Neuropathy OSA Chronic pain  Allergies: dalbavancin  Past surgical history:  C-section Myomectomy Hysterectomy C-section Tonsillectomy Bilateral TKA Gastric band  Social history: Denies use of nicotine product (smoking, vaping, patches, smokeless) Alcohol use: denies Denies recreational drug use   Physical Exam:  BMI of 48.2  General: no acute distress, appears stated age Neurologic: alert, answering questions appropriately, following commands Respiratory: unlabored breathing on room air, symmetric chest rise Psychiatric: appropriate affect, normal cadence to speech   MSK (spine):  -Strength exam      Left  Right EHL    5/5  5/5 TA    5/5  5/5 GSC    5/5  5/5 Knee extension  5/5  5/5 Hip flexion   5/5  5/5  -Sensory exam    Sensation intact to light touch in L3-S1 nerve distributions of bilateral lower extremities  -Achilles DTR: 2/4 on the left, 2/4 on the right -Patellar tendon DTR: 1/4 on the left, 1/4 on the right  -Straight leg raise: Negative bilaterally -Clonus: no beats bilaterally  Imaging: XRs of the lumbar spine from 02/27/2024 were independently reviewed and interpreted, showing disc height loss with anterior  osteophyte formation at T12/L1.  Disc height loss with anterior osteophyte formation at L4/5.  No evidence of instability on flexion/extension views.  No fracture or dislocation seen.  MRI of the lumbar spine from 12/04/2023 was independently reviewed and interpreted, showing increased T2 signal within the T12/L1 disc space.  No increased T2 signal within the endplates.  DDD and Modic changes at  L4/5.  Left-sided foraminal stenosis at L4/5 and right T12 foraminal stenosis.  No other significant stenosis seen.   Patient name: Mary Morrison Patient MRN: [id number] Date of visit: 02/27/24

## 2024-02-28 LAB — C-REACTIVE PROTEIN: CRP: <3 mg/L (ref <8.0)

## 2024-02-28 LAB — SEDIMENTATION RATE: Sed Rate: 9 mm/h (ref 0–30)

## 2024-03-12 ENCOUNTER — Ambulatory Visit: Admitting: Physical Medicine and Rehabilitation

## 2024-03-12 ENCOUNTER — Other Ambulatory Visit: Payer: Self-pay

## 2024-03-12 VITALS — BP 139/81

## 2024-03-12 DIAGNOSIS — M5416 Radiculopathy, lumbar region: Secondary | ICD-10-CM

## 2024-03-12 MED ORDER — METHYLPREDNISOLONE ACETATE 40 MG/ML IJ SUSP
40.0000 mg | Freq: Once | INTRAMUSCULAR | Status: AC
  Administered 2024-03-12: 40 mg

## 2024-03-12 NOTE — Progress Notes (Signed)
 Pain Scale   Average Pain 8  Pain bilateral lower back right is worse, also mid back pain.    +Driver, -BT -Dye Allergies.

## 2024-03-16 NOTE — Progress Notes (Signed)
 Mary Morrison - 58 y.o. female MRN [id number]  Date of birth: 1966/07/03  Office Visit Note: Visit Date: 03/12/2024 PCP: Teresa Channel, MD Referred by: Georgina Ozell LABOR, MD  Subjective: Chief Complaint  Patient presents with   Lower Back - Pain   HPI:  Mary Morrison is a 58 y.o. female who comes in today at the request of Duwaine Pouch, FNP for planned Right L4-5 Lumbar Interlaminar epidural steroid injection with fluoroscopic guidance.  The patient has failed conservative care including home exercise, medications, time and activity modification.  This injection will be diagnostic and hopefully therapeutic.  Please see requesting physician notes for further details and justification.   ROS Otherwise per HPI.  Assessment & Plan: Visit Diagnoses:    ICD-10-CM   1. Lumbar radiculopathy  M54.16 XR C-ARM NO REPORT    Epidural Steroid injection    methylPREDNISolone  acetate (DEPO-MEDROL ) injection 40 mg      Plan: No additional findings.   Meds & Orders:  Meds ordered this encounter  Medications   methylPREDNISolone  acetate (DEPO-MEDROL ) injection 40 mg    Orders Placed This Encounter  Procedures   XR C-ARM NO REPORT   Epidural Steroid injection    Follow-up: Return for visit to requesting provider as needed.   Procedures: No procedures performed  Lumbar Epidural Steroid Injection - Interlaminar Approach with Fluoroscopic Guidance  Patient: Mary Morrison      Date of Birth: 1965/12/12 MRN: [id number] PCP: Teresa Channel, MD      Visit Date: 03/12/2024   Universal Protocol:     Consent Given By: the patient  Position: PRONE  Additional Comments: Vital signs were monitored before and after the procedure. Patient was prepped and draped in the usual sterile fashion. The correct patient, procedure, and site was verified.   Injection Procedure Details:   Procedure diagnoses: Lumbar radiculopathy [M54.16]   Meds Administered:  Meds ordered this  encounter  Medications   methylPREDNISolone  acetate (DEPO-MEDROL ) injection 40 mg     Laterality: Right  Location/Site:  L4-5  Needle: 3.5 in., 20 ga. Tuohy  Needle Placement: Paramedian epidural  Findings:   -Comments: Excellent flow of contrast into the epidural space. I had to change to L4-5 due to stenosis.  Procedure Details: Using a paramedian approach from the side mentioned above, the region overlying the inferior lamina was localized under fluoroscopic visualization and the soft tissues overlying this structure were infiltrated with 4 ml. of 1% Lidocaine  without Epinephrine. The Tuohy needle was inserted into the epidural space using a paramedian approach.   The epidural space was localized using loss of resistance along with counter oblique bi-planar fluoroscopic views.  After negative aspirate for air, blood, and CSF, a 2 ml. volume of Isovue-250 was injected into the epidural space and the flow of contrast was observed. Radiographs were obtained for documentation purposes.    The injectate was administered into the level noted above.   Additional Comments:  No complications occurred Dressing: 2 x 2 sterile gauze and Band-Aid    Post-procedure details: Patient was observed during the procedure. Post-procedure instructions were reviewed.  Patient left the clinic in stable condition.   Clinical History: EXAM: MRI LUMBAR SPINE WITHOUT CONTRAST   Previous lumbar MRI 02/26/2017.   FINDINGS: Segmentation: Normal, concordant with the thoracic MRI reported separately.   Alignment: Mild dextroconvex lumbar scoliosis seems increased since the 2018 MRI. Relatively stable lumbar lordosis.   Vertebrae: Abnormal discs and endplate changes now at T12-L1 and L4-L5,  new since the 2018 MRI.   At L4-L5 there is chronic appearing disc space loss and endplate degeneration with no marrow edema. See additional details of that level below.   At T12-L1 there is conspicuous  increased T2 and STIR signal throughout much of the disc space, with bulky surrounding endplate spurring and heterogeneous seemingly acute and chronic marrow signal changes with patchy marrow edema visible on series 108, image 8 in both the T12 and L1 bodies. However, there seems to be developing interbody bridging bone also, flowing peripheral endplate osteophytes, and there is no surrounding paraspinal soft tissue inflammation identified. See additional findings at that level below.   No other marrow edema or acute osseous abnormality. Intact visible sacrum and SI joints.   Conus medullaris and cauda equina: Conus extends to the L1 level. No lower spinal cord or conus signal abnormality.   Paraspinal and other soft tissues: Stable and negative.   Disc levels:   T12-L1: Abnormal disc and endplate findings as stated above. Subsequent circumferential disc osteophyte complex with a broad-based posterior component asymmetric to the right. Moderate facet hypertrophy also on that side, mild on the left. New spinal stenosis here since 2018 with up to mild mass effect on the conus. No conus signal abnormality. Moderate to severe multifactorial right T12 foraminal stenosis (series 106, image 7).   L1-L2:  This level is largely stable and negative.   L2-L3:  Also largely stable and negative.   L3-L4: Mild circumferential disc bulging here since 2018. Up to moderate facet and ligament flavum hypertrophy has developed with degenerative facet joint fluid. Increased epidural lipomatosis. Mild new spinal stenosis (series 114, image 29). Mild bilateral L3 foraminal stenosis is new.   L4-L5: Moderate to severe disc space loss since 2018. Circumferential disc osteophyte complex asymmetric to the left. Mild to moderate facet and ligament flavum hypertrophy greater on that side. But no significant spinal or lateral recess stenosis. Moderate left L4 foraminal stenosis.   L5-S1: Mild disc  space loss and rightward circumferential disc bulge and endplate spurring since 2018. Increased and moderate facet hypertrophy. No significant spinal or lateral recess stenosis. Moderate to severe right L5 foraminal stenosis is progressed.   IMPRESSION: 1. Abnormal T12-L1 disc and endplate changes asymmetric to the right with mixed marrow signal including some marrow edema. However, there also appears to be developing interbody ankylosis here, and there is no surrounding soft tissue inflammation. This level was normal in 2018. The constellation favors a chronic/healing discitis osteomyelitis. However, a hyperacute discitis osteomyelitis would be difficult to exclude - in which case a short interval repeat lumbar MRI two weeks would be valuable to document stability. There is associated mild spinal stenosis with mild mass effect on the conus, and moderate to severe right T12 neural foraminal stenosis. No conus signal abnormality.   2. Substantially advanced disc and endplate degeneration since 2018 also at L4-L5, appears chronic. Moderate left L4 neural foraminal stenosis results.   3. Less severe L3-L4 progressed degeneration with new mild multifactorial spinal and bilateral foraminal stenosis there.   4. Progressed L5-S1 degeneration on the right with multifactorial increased and now moderate to severe right L5 neural foraminal stenosis.    On: 12/19/2023 07:47     Objective:  VS:  HT:    WT:   BMI:     BP:139/81  HR: bpm  TEMP: ( )  RESP:  Physical Exam Vitals and nursing note reviewed.  Constitutional:      General: She is not  in acute distress.    Appearance: Normal appearance. She is well-developed. She is not ill-appearing.  HENT:     Head: Normocephalic and atraumatic.     Right Ear: External ear normal.     Left Ear: External ear normal.  Eyes:     Extraocular Movements: Extraocular movements intact.     Conjunctiva/sclera: Conjunctivae normal.      Pupils: Pupils are equal, round, and reactive to light.  Cardiovascular:     Rate and Rhythm: Normal rate.     Pulses: Normal pulses.  Pulmonary:     Effort: Pulmonary effort is normal. No respiratory distress.  Abdominal:     General: There is no distension.     Palpations: Abdomen is soft.  Musculoskeletal:        General: Tenderness present.     Cervical back: Neck supple.     Right lower leg: No edema.     Left lower leg: No edema.     Comments: Patient has good distal strength with no pain over the greater trochanters.  No clonus or focal weakness.  Skin:    General: Skin is warm and dry.     Findings: No erythema, lesion or rash.  Neurological:     General: No focal deficit present.     Mental Status: She is alert and oriented to person, place, and time.     Sensory: No sensory deficit.     Motor: No weakness or abnormal muscle tone.     Coordination: Coordination normal.     Gait: Gait normal.  Psychiatric:        Mood and Affect: Mood normal.        Behavior: Behavior normal.      Imaging: No results found.

## 2024-03-16 NOTE — Procedures (Signed)
 Lumbar Epidural Steroid Injection - Interlaminar Approach with Fluoroscopic Guidance  Patient: Mary Morrison      Date of Birth: 11/05/1965 MRN: [id number] PCP: Teresa Channel, MD      Visit Date: 03/12/2024   Universal Protocol:     Consent Given By: the patient  Position: PRONE  Additional Comments: Vital signs were monitored before and after the procedure. Patient was prepped and draped in the usual sterile fashion. The correct patient, procedure, and site was verified.   Injection Procedure Details:   Procedure diagnoses: Lumbar radiculopathy [M54.16]   Meds Administered:  Meds ordered this encounter  Medications   methylPREDNISolone  acetate (DEPO-MEDROL ) injection 40 mg     Laterality: Right  Location/Site:  L4-5  Needle: 3.5 in., 20 ga. Tuohy  Needle Placement: Paramedian epidural  Findings:   -Comments: Excellent flow of contrast into the epidural space. I had to change to L4-5 due to stenosis.  Procedure Details: Using a paramedian approach from the side mentioned above, the region overlying the inferior lamina was localized under fluoroscopic visualization and the soft tissues overlying this structure were infiltrated with 4 ml. of 1% Lidocaine  without Epinephrine. The Tuohy needle was inserted into the epidural space using a paramedian approach.   The epidural space was localized using loss of resistance along with counter oblique bi-planar fluoroscopic views.  After negative aspirate for air, blood, and CSF, a 2 ml. volume of Isovue-250 was injected into the epidural space and the flow of contrast was observed. Radiographs were obtained for documentation purposes.    The injectate was administered into the level noted above.   Additional Comments:  No complications occurred Dressing: 2 x 2 sterile gauze and Band-Aid    Post-procedure details: Patient was observed during the procedure. Post-procedure instructions were reviewed.  Patient left the  clinic in stable condition.

## 2024-04-08 DIAGNOSIS — F331 Major depressive disorder, recurrent, moderate: Secondary | ICD-10-CM | POA: Diagnosis not present

## 2024-04-08 DIAGNOSIS — F411 Generalized anxiety disorder: Secondary | ICD-10-CM | POA: Diagnosis not present

## 2024-04-08 DIAGNOSIS — G4700 Insomnia, unspecified: Secondary | ICD-10-CM | POA: Diagnosis not present

## 2024-04-21 ENCOUNTER — Encounter: Payer: Self-pay | Admitting: Diagnostic Neuroimaging

## 2024-04-21 ENCOUNTER — Ambulatory Visit: Admitting: Diagnostic Neuroimaging

## 2024-04-21 VITALS — BP 141/84 | HR 89 | Ht 66.0 in | Wt 310.0 lb

## 2024-04-21 DIAGNOSIS — G541 Lumbosacral plexus disorders: Secondary | ICD-10-CM

## 2024-04-21 NOTE — Patient Instructions (Addendum)
  RIGHT LEG PAIN, NUMBNESS --> RIGHT L3 LUMBAR RADICULOPATHY (right L3 distribution; right L3 root on MRI without significant compression; could be due to plexopathy or other peripheral cause) - check MRI lumbosacral plexus and neuropathy labs - continue PT exercises; consider indoor bike, water  therapy exercises - follow up right hip evaluation with orthopedic clinic - then may consider EMG/NCS

## 2024-04-21 NOTE — Progress Notes (Signed)
 [facility]  PATIENT: Mary Morrison DOB: 08/07/1965  REFERRING CLINICIAN: Addie Cordella Hamilton, MD HISTORY FROM: patient  REASON FOR VISIT: new consult   HISTORICAL  CHIEF COMPLAINT:  Chief Complaint  Patient presents with   Extremity Weakness    Rm 7 alone  Pt is well, reports R sided numbness and weakness for at least a yr. No concerns with L side.     HISTORY OF PRESENT ILLNESS:   58 year old female here for evaluation of right leg weakness.  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  By her late 17s she started having problems with her knees including meniscus issues and ultimately underwent procedures and surgeries for her knees.  Through her 40s she was not able to exercise and participate in these activities.  She also developed significant depression.  These factors combined to worsening physical capabilities, increased weight, increased pain.  December 2024 patient was having increasing problems with pain and gait difficulties.  She had a severe fall in December 2024 with resultant right leg pain and weakness.  At some point she also developed some right arm problems.  She followed up with orthopedic clinic and had MRI of the cervical, thoracic and lumbar spine, showed some mild foraminal narrowing at C5-6 and C6-7, and some constellation of symptoms including abnormal disc and endplate changes at T12-L1, L3-4 progressive degenerative changes and new spinal stenosis and foraminal stenosis, and increased moderate-severe right L5 foraminal stenosis.  Also had MRI of the brain which was unremarkable.  Patient followed up with spine surgery consult (not felt to be surgically treatable) as well as lumbar epidural steroid injection which has not helped.    REVIEW OF SYSTEMS: Full 14 system  review of systems performed and negative with exception of: as per HPI.  ALLERGIES: Allergies  Allergen Reactions   Dalbavancin     Other reaction(s): rash    HOME MEDICATIONS: Outpatient Medications Prior to Visit  Medication Sig Dispense Refill   albuterol  (VENTOLIN  HFA) 108 (90 Base) MCG/ACT inhaler Inhale 1-2 puffs into the lungs every 6 (six) hours as needed for wheezing or shortness of breath. 1 each 0   Ascorbic Acid (VITAMIN C) 1000 MG tablet Take 1,000 mg by mouth daily.     buPROPion  (WELLBUTRIN  XL) 300 MG 24 hr tablet Take 300 mg by mouth daily.      Calcium-Vitamin D-Vitamin K (VIACTIV PO) Take 1 tablet by mouth daily.     Cyanocobalamin (B-12 PO) Take 1 tablet by mouth daily. Patient is unsure of dose     diclofenac Sodium (VOLTAREN) 1 % GEL Apply 2 g topically daily as needed (pain).     escitalopram  (LEXAPRO ) 10 MG tablet Take 20 mg by mouth at bedtime.     furosemide (LASIX) 20 MG tablet Take 20 mg by mouth daily.     ibuprofen  (ADVIL ) 800 MG tablet TAKE 1 TABLET BY MOUTH EVERY 8 (EIGHT) HOURS AS NEEDED FOR MODERATE PAIN (MUST TAKE WITH FOOD). 60 tablet 2   losartan (COZAAR) 50 MG tablet Take 50 mg by mouth daily.     Menthol , Topical Analgesic, (BIOFREEZE EX) Apply 1 application topically daily as needed (pain).     Multiple Vitamins-Minerals (EMERGEN-C VITAMIN C PO) Take 1 tablet by mouth daily.     potassium chloride  (MICRO-K ) 10 MEQ CR capsule Take 10 mEq by  mouth daily.     Probiotic Product (PROBIOTIC PO) Take 1 capsule by mouth daily.     traZODone  (DESYREL ) 50 MG tablet Take 200 mg by mouth at bedtime.     zinc gluconate 50 MG tablet Take 50 mg by mouth daily.     methocarbamol  (ROBAXIN ) 500 MG tablet Take 1 tablet (500 mg total) by mouth every 8 (eight) hours as needed for muscle spasms. (Patient not taking: Reported on 04/21/2024) 30 tablet 0   predniSONE  (STERAPRED UNI-PAK 21 TAB) 5 MG (21) TBPK tablet Take dosepak as directed (Patient not taking: Reported on  04/21/2024) 21 tablet 0   traMADol  (ULTRAM ) 50 MG tablet Take 1 tablet (50 mg total) by mouth every 8 (eight) hours as needed. (Patient not taking: Reported on 04/21/2024) 30 tablet 0   No facility-administered medications prior to visit.    PAST MEDICAL HISTORY: Past Medical History:  Diagnosis Date   ADHD    Alcohol abuse    Anxiety    Arthritis    Asthma    EXERCISED INDUCED 2014    Depression    Drug use    Edema, lower extremity    Fatty liver    Fibroids    High blood pressure    Obesity    Seasonal allergies    Sleep apnea    SOB (shortness of breath)    Varicose vein of leg    Vitamin B 12 deficiency     PAST SURGICAL HISTORY: Past Surgical History:  Procedure Laterality Date   BILATERAL SALPINGECTOMY Bilateral 09/21/2014   Procedure: BILATERAL SALPINGECTOMY;  Surgeon: Dickie DELENA Carder, MD;  Location: [facility];  Service: Gynecology;  Laterality: Bilateral;   CESAREAN SECTION  1989   COLONOSCOPY     LAPAROSCOPIC GASTRIC BANDING N/A 02/17/2014   Procedure: LAPAROSCOPIC GASTRIC BANDING;  Surgeon: Donnice KATHEE Lunger, MD;  Location: [facility];  Service: General;  Laterality: N/A;   ROBOT ASSISTED MYOMECTOMY N/A 09/21/2014   Procedure: ROBOTIC ASSISTED MYOMECTOMY;  Surgeon: Dickie DELENA Carder, MD;  Location: [facility];  Service: Gynecology;  Laterality: N/A;   ROBOTIC ASSISTED TOTAL HYSTERECTOMY N/A 09/21/2014   Procedure: ROBOTIC ASSISTED TOTAL HYSTERECTOMY, EXCISION OF PELVIC ENDOMETRIOSIS;  Surgeon: Dickie DELENA Carder, MD;  Location: [facility];  Service: Gynecology;  Laterality: N/A;   TONSILLECTOMY     TOTAL KNEE ARTHROPLASTY Left 07/03/2017   Procedure: LEFT TOTAL KNEE ARTHROPLASTY;  Surgeon: Addie Cordella Hamilton, MD;  Location: [facility];  Service: Orthopedics;  Laterality: Left;   TOTAL KNEE ARTHROPLASTY Right 07/10/2019   Procedure: RIGHT TOTAL KNEE ARTHROPLASTY;  Surgeon: Addie Cordella Hamilton, MD;  Location: [facility] OR;  Service: Orthopedics;  Laterality: Right;    FAMILY  HISTORY: Family History  Problem Relation Age of Onset   Cancer Mother        breast/bilateral mastectomies   High blood pressure Mother    Depression Mother    Heart disease Mother    Anxiety disorder Mother    Obesity Mother    Alcoholism Father    Diabetes Other    Hypertension Other    Stroke Other    Obesity Other     SOCIAL HISTORY: Social History   Socioeconomic History   Marital status: Divorced    Spouse name: Not on file   Number of children: Not on file   Years of education: Not on file   Highest education level: Not on file  Occupational History   Occupation: Self employed  Tobacco Use  Smoking status: Never   Smokeless tobacco: Never  Vaping Use   Vaping status: Never Used  Substance and Sexual Activity   Alcohol use: No   Drug use: No   Sexual activity: Not Currently    Birth control/protection: None  Other Topics Concern   Not on file  Social History Narrative   Not on file   Social Drivers of Health   Financial Resource Strain: Not on file  Food Insecurity: Not on file  Transportation Needs: Not on file  Physical Activity: Not on file  Stress: Not on file  Social Connections: Not on file  Intimate Partner Violence: Not on file     PHYSICAL EXAM  GENERAL EXAM/CONSTITUTIONAL: Vitals:  Vitals:   04/21/24 1027  BP: (!) 141/84  Pulse: 89  Weight: (!) 310 lb (140.6 kg)  Height: 5' 6 (1.676 m)   Body mass index is 50.04 kg/m. Wt Readings from Last 3 Encounters:  04/21/24 (!) 310 lb (140.6 kg)  02/27/24 298 lb 12.8 oz (135.5 kg)  08/18/22 280 lb (127 kg)   Patient is in no distress; well developed, nourished and groomed; neck is supple  CARDIOVASCULAR: Examination of carotid arteries is normal; no carotid bruits Regular rate and rhythm, no murmurs Examination of peripheral vascular system by observation and palpation is normal  EYES: Ophthalmoscopic exam of optic discs and posterior segments is normal; no papilledema or  hemorrhages No results found.  MUSCULOSKELETAL: Gait, strength, tone, movements noted in Neurologic exam below  NEUROLOGIC: MENTAL STATUS:      No data to display         awake, alert, oriented to person, place and time recent and remote memory intact normal attention and concentration language fluent, comprehension intact, naming intact fund of knowledge appropriate  CRANIAL NERVE:  2nd - no papilledema on fundoscopic exam 2nd, 3rd, 4th, 6th - pupils equal and reactive to light, visual fields full to confrontation, extraocular muscles intact, no nystagmus 5th - facial sensation symmetric 7th - facial strength symmetric 8th - hearing intact 9th - palate elevates symmetrically, uvula midline 11th - shoulder shrug symmetric 12th - tongue protrusion midline  MOTOR:  normal bulk and tone, full strength in the BUE, BLE; EXCEPT RIGHT HIP FLEXION 4+ LIMITED BY PAIN DECR ROM OF RIGHT HIP EXTERNAL ROTATION (TRIGGERS PAIN)  SENSORY:  normal and symmetric to light touch, pinprick, temperature, vibration  COORDINATION:  finger-nose-finger, fine finger movements normal  REFLEXES:  deep tendon reflexes TRACE and symmetric  GAIT/STATION:  narrow based gait     DIAGNOSTIC DATA (LABS, IMAGING, TESTING) - I reviewed patient records, labs, notes, testing and imaging myself where available.  Lab Results  Component Value Date   WBC 3.7 (L) 12/24/2023   HGB 14.0 12/24/2023   HCT 42.7 12/24/2023   MCV 106.2 (H) 12/24/2023   PLT 211 12/24/2023      Component Value Date/Time   NA 139 05/29/2021 1348   NA 141 01/26/2021 0000   K 3.8 05/29/2021 1348   CL 101 05/29/2021 1348   CO2 29 05/29/2021 1348   GLUCOSE 97 05/29/2021 1348   BUN 10 05/29/2021 1348   BUN 13 01/26/2021 0000   CREATININE 0.90 05/29/2021 1348   CREATININE 0.83 10/29/2020 1212   CREATININE 0.77 09/23/2013 1428   CALCIUM 9.3 05/29/2021 1348   PROT 6.4 (L) 10/29/2020 1212   ALBUMIN 4.1 01/26/2021 0000    AST 17 01/26/2021 0000   AST 20 10/29/2020 1212   ALT 25 01/26/2021  0000   ALT 42 10/29/2020 1212   ALKPHOS 56 01/26/2021 0000   BILITOT 0.5 10/29/2020 1212   GFRNONAA >60 05/29/2021 1348   GFRNONAA >60 10/29/2020 1212   GFRAA >60 02/05/2020 1236   Lab Results  Component Value Date   CHOL 176 01/26/2021   HDL 64 01/26/2021   LDLCALC 99 01/26/2021   TRIG 67 01/26/2021   CHOLHDL 3.5 09/23/2013   No results found for: HGBA1C Lab Results  Component Value Date   VITAMINB12 446 10/29/2020   Lab Results  Component Value Date   TSH 0.53 01/26/2021   09/21/23 MRI cervical spine - Mild cervical spondylosis without central canal narrowing. Mild bilateral foraminal narrowing C5-6 and C6-7 noted.   12/04/23 MRI thoracic / lumbar spine [I reviewed images myself and agree with interpretation. -VRP]  1. Abnormal T12-L1 disc and endplate changes asymmetric to the right with mixed marrow signal including some marrow edema. However, there also appears to be developing interbody ankylosis here, and there is no surrounding soft tissue inflammation. This level was normal in 2018. The constellation favors a chronic/healing discitis osteomyelitis. However, a hyperacute discitis osteomyelitis would be difficult to exclude - in which case a short interval repeat lumbar MRI two weeks would be valuable to document stability. There is associated mild spinal stenosis with mild mass effect on the conus, and moderate to severe right T12 neural foraminal stenosis. No conus signal abnormality.   2. Substantially advanced disc and endplate degeneration since 2018 also at L4-L5, appears chronic. Moderate left L4 neural foraminal stenosis results.   3. Less severe L3-L4 progressed degeneration with new mild multifactorial spinal and bilateral foraminal stenosis there.   4. Progressed L5-S1 degeneration on the right with multifactorial increased and now moderate to severe right L5 neural  foraminal stenosis.   01/05/24 MRI brain [I reviewed images myself and agree with interpretation. -VRP]  - Unremarkable exam.     ASSESSMENT AND PLAN  58 y.o. year old female here with:   Dx:  1. Lumbosacral plexopathy      PLAN:  RIGHT LEG PAIN, NUMBNESS --> RIGHT L3 LUMBAR RADICULOPATHY (right L3 distribution; right L3 root on MRI without significant compression; could be due to plexopathy or other peripheral cause) - check MRI lumbosacral plexus and neuropathy labs - continue PT exercises; consider indoor bike, water  therapy exercises - follow up right hip evaluation with orthopedic clinic - then may consider EMG/NCS  Orders Placed This Encounter  Procedures   MR PELVIS W WO CONTRAST   CBC with diff   CMP   Vitamin B12   A1c   TSH   SPEP with IFE   ANA w/Reflex   SSA, SSB   ANCA Profile   Return for pending if symptoms worsen or fail to improve, pending test results.  I spent 60 minutes of face-to-face and non-face-to-face time with patient.  This included previsit chart review, lab review, study review, order entry, electronic health record documentation, patient education.      EDUARD FABIENE HANLON, MD 04/21/2024, 11:04 AM Certified in Neurology, Neurophysiology and Neuroimaging  [facility] [address] [place], [place] [address] [phone]

## 2024-04-22 ENCOUNTER — Telehealth: Payer: Self-pay | Admitting: Diagnostic Neuroimaging

## 2024-04-22 LAB — ANA W/REFLEX: ANA Titer 1: NEGATIVE

## 2024-04-22 NOTE — Telephone Encounter (Signed)
MRI order sent to [place] [phone]

## 2024-04-23 LAB — MULTIPLE MYELOMA PANEL, SERUM
Albumin SerPl Elph-Mcnc: 3.5 g/dL (ref 2.9–4.4)
Albumin/Glob SerPl: 1.3 (ref 0.7–1.7)
Alpha 1: 0.2 g/dL (ref 0.0–0.4)
Alpha2 Glob SerPl Elph-Mcnc: 0.6 g/dL (ref 0.4–1.0)
B-Globulin SerPl Elph-Mcnc: 1 g/dL (ref 0.7–1.3)
Gamma Glob SerPl Elph-Mcnc: 0.9 g/dL (ref 0.4–1.8)
Globulin, Total: 2.7 g/dL (ref 2.2–3.9)
IgA/Immunoglobulin A, Serum: 386 mg/dL — AB (ref 87–352)
IgG (Immunoglobin G), Serum: 511 mg/dL — AB (ref 586–1602)
IgM (Immunoglobulin M), Srm: 78 mg/dL (ref 26–217)
M Protein SerPl Elph-Mcnc: 0.3 g/dL — AB

## 2024-04-23 LAB — COMPREHENSIVE METABOLIC PANEL WITH GFR
ALT: 25 IU/L (ref 0–32)
AST: 18 IU/L (ref 0–40)
Albumin: 4.2 g/dL (ref 3.8–4.9)
Alkaline Phosphatase: 76 IU/L (ref 49–135)
BUN/Creatinine Ratio: 22 (ref 9–23)
BUN: 18 mg/dL (ref 6–24)
Bilirubin Total: 0.3 mg/dL (ref 0.0–1.2)
CO2: 25 mmol/L (ref 20–29)
Calcium: 9.4 mg/dL (ref 8.7–10.2)
Chloride: 102 mmol/L (ref 96–106)
Creatinine, Ser: 0.83 mg/dL (ref 0.57–1.00)
Globulin, Total: 2 g/dL (ref 1.5–4.5)
Glucose: 89 mg/dL (ref 70–99)
Potassium: 4.6 mmol/L (ref 3.5–5.2)
Sodium: 141 mmol/L (ref 134–144)
Total Protein: 6.2 g/dL (ref 6.0–8.5)
eGFR: 82 mL/min/1.73 (ref >59)

## 2024-04-23 LAB — CBC WITH DIFFERENTIAL/PLATELET
Basophils Absolute: 0 x10E3/uL (ref 0.0–0.2)
Basos: 1 %
EOS (ABSOLUTE): 0.2 x10E3/uL (ref 0.0–0.4)
Eos: 5 %
Hematocrit: 38.9 % (ref 34.0–46.6)
Hemoglobin: 12.8 g/dL (ref 11.1–15.9)
Immature Grans (Abs): 0 x10E3/uL (ref 0.0–0.1)
Immature Granulocytes: 0 %
Lymphocytes Absolute: 1.3 x10E3/uL (ref 0.7–3.1)
Lymphs: 34 %
MCH: 35.2 pg — ABNORMAL HIGH (ref 26.6–33.0)
MCHC: 32.9 g/dL (ref 31.5–35.7)
MCV: 107 fL — ABNORMAL HIGH (ref 79–97)
Monocytes Absolute: 0.5 x10E3/uL (ref 0.1–0.9)
Monocytes: 14 %
Neutrophils Absolute: 1.7 x10E3/uL (ref 1.4–7.0)
Neutrophils: 46 %
Platelets: 198 x10E3/uL (ref 150–450)
RBC: 3.64 x10E6/uL — ABNORMAL LOW (ref 3.77–5.28)
RDW: 12 % (ref 11.7–15.4)
WBC: 3.7 x10E3/uL (ref 3.4–10.8)

## 2024-04-23 LAB — ANCA PROFILE
Anti-MPO Antibodies: <0.2 U (ref 0.0–0.9)
Anti-PR3 Antibodies: <0.2 U (ref 0.0–0.9)
Atypical pANCA: <1:20 {titer}
C-ANCA: <1:20 {titer}
P-ANCA: <1:20 {titer}

## 2024-04-23 LAB — SJOGREN'S SYNDROME ANTIBODS(SSA + SSB)
ENA SSA (RO) Ab: 0.3 AI (ref 0.0–0.9)
ENA SSB (LA) Ab: <0.2 AI (ref 0.0–0.9)

## 2024-04-23 LAB — HEMOGLOBIN A1C
Est. average glucose Bld gHb Est-mCnc: 128 mg/dL
Hgb A1c MFr Bld: 6.1 % — ABNORMAL HIGH (ref 4.8–5.6)

## 2024-04-23 LAB — TSH: TSH: 2.11 u[IU]/mL (ref 0.450–4.500)

## 2024-04-23 LAB — VITAMIN B12: Vitamin B-12: 573 pg/mL (ref 232–1245)

## 2024-05-09 ENCOUNTER — Ambulatory Visit: Payer: Self-pay | Admitting: Diagnostic Neuroimaging

## 2024-05-26 ENCOUNTER — Encounter: Payer: Self-pay | Admitting: Radiology

## 2024-05-27 ENCOUNTER — Encounter: Payer: Self-pay | Admitting: Diagnostic Neuroimaging

## 2024-05-27 DIAGNOSIS — R051 Acute cough: Secondary | ICD-10-CM | POA: Diagnosis not present

## 2024-05-27 DIAGNOSIS — Z8709 Personal history of other diseases of the respiratory system: Secondary | ICD-10-CM | POA: Diagnosis not present

## 2024-05-27 DIAGNOSIS — J209 Acute bronchitis, unspecified: Secondary | ICD-10-CM | POA: Diagnosis not present

## 2024-05-27 DIAGNOSIS — R0981 Nasal congestion: Secondary | ICD-10-CM | POA: Diagnosis not present

## 2024-05-27 DIAGNOSIS — J329 Chronic sinusitis, unspecified: Secondary | ICD-10-CM | POA: Diagnosis not present

## 2024-06-02 DIAGNOSIS — D472 Monoclonal gammopathy: Secondary | ICD-10-CM | POA: Diagnosis not present

## 2024-06-04 DIAGNOSIS — F3341 Major depressive disorder, recurrent, in partial remission: Secondary | ICD-10-CM | POA: Diagnosis not present

## 2024-06-04 DIAGNOSIS — G4700 Insomnia, unspecified: Secondary | ICD-10-CM | POA: Diagnosis not present

## 2024-06-04 DIAGNOSIS — J301 Allergic rhinitis due to pollen: Secondary | ICD-10-CM | POA: Diagnosis not present

## 2024-06-04 DIAGNOSIS — Z0000 Encounter for general adult medical examination without abnormal findings: Secondary | ICD-10-CM | POA: Diagnosis not present

## 2024-06-04 DIAGNOSIS — R7303 Prediabetes: Secondary | ICD-10-CM | POA: Diagnosis not present

## 2024-06-04 DIAGNOSIS — G4733 Obstructive sleep apnea (adult) (pediatric): Secondary | ICD-10-CM | POA: Diagnosis not present

## 2024-06-04 DIAGNOSIS — K760 Fatty (change of) liver, not elsewhere classified: Secondary | ICD-10-CM | POA: Diagnosis not present

## 2024-06-04 DIAGNOSIS — J4530 Mild persistent asthma, uncomplicated: Secondary | ICD-10-CM | POA: Diagnosis not present

## 2024-06-04 DIAGNOSIS — R5382 Chronic fatigue, unspecified: Secondary | ICD-10-CM | POA: Diagnosis not present

## 2024-06-04 DIAGNOSIS — I10 Essential (primary) hypertension: Secondary | ICD-10-CM | POA: Diagnosis not present

## 2024-06-04 DIAGNOSIS — D472 Monoclonal gammopathy: Secondary | ICD-10-CM | POA: Diagnosis not present

## 2024-06-07 ENCOUNTER — Other Ambulatory Visit

## 2024-07-02 DIAGNOSIS — Z1231 Encounter for screening mammogram for malignant neoplasm of breast: Secondary | ICD-10-CM | POA: Diagnosis not present

## 2024-07-10 ENCOUNTER — Inpatient Hospital Stay
Admission: RE | Admit: 2024-07-10 | Discharge: 2024-07-10 | Attending: Diagnostic Neuroimaging | Admitting: Diagnostic Neuroimaging

## 2024-07-10 ENCOUNTER — Ambulatory Visit: Admitting: Surgical

## 2024-07-10 ENCOUNTER — Other Ambulatory Visit: Payer: Self-pay

## 2024-07-10 DIAGNOSIS — M19011 Primary osteoarthritis, right shoulder: Secondary | ICD-10-CM | POA: Diagnosis not present

## 2024-07-10 DIAGNOSIS — G541 Lumbosacral plexus disorders: Secondary | ICD-10-CM

## 2024-07-10 DIAGNOSIS — M25511 Pain in right shoulder: Secondary | ICD-10-CM

## 2024-07-10 MED ORDER — GADOPICLENOL 0.5 MMOL/ML IV SOLN
7.5000 mL | Freq: Once | INTRAVENOUS | Status: AC | PRN
  Administered 2024-07-10: 13:00:00 7.5 mL via INTRAVENOUS

## 2024-07-13 ENCOUNTER — Encounter: Payer: Self-pay | Admitting: Surgical

## 2024-07-13 MED ORDER — METHYLPREDNISOLONE ACETATE 40 MG/ML IJ SUSP
40.0000 mg | INTRAMUSCULAR | Status: AC | PRN
  Administered 2024-07-10: 40 mg via INTRA_ARTICULAR

## 2024-07-13 MED ORDER — BUPIVACAINE HCL 0.25 % IJ SOLN
9.0000 mL | INTRAMUSCULAR | Status: AC | PRN
  Administered 2024-07-10: 9 mL via INTRA_ARTICULAR

## 2024-07-13 NOTE — Progress Notes (Signed)
 "  Office Visit Note   Patient: Mary Morrison           Date of Birth: May 14, 1966           MRN: [id number] Visit Date: 07/10/2024 Requested by: Teresa Channel, MD [phone] [address] [place],  [place] [address] PCP: Teresa Channel, MD  Subjective: Chief Complaint  Patient presents with   Right Shoulder - Pain    HPI: Mary Morrison is a 58 y.o. female who presents to the office reporting right shoulder pain.  Patient states that she has had pain for about 2 months that is getting more more painful.  She denies any history of injury.  She describes anterior pain that radiates into the bicep region.  Sometimes it hurts so bad that it makes her want to throw up.  She will have occasional pain all the way down into the hand though radiation to the bicep is more common.  She also has some numbness in her right hand as well.  She feels this may have started after she used a watering hose for about 8 hours straight 1 day.  Has had massage therapy for this problem.  Has increased pain with her arm hanging down.  She is right-hand dominant.  Has tried Aleve, Tylenol , Motrin , tramadol  without much relief..                ROS: All systems reviewed are negative as they relate to the chief complaint within the history of present illness.  Patient denies fevers or chills.  Assessment & Plan: Visit Diagnoses:  1. Glenohumeral arthritis, right   2. Acute pain of right shoulder     Plan: Impression is 58 year old female who is here for right shoulder pain ongoing over 2 months.  She has radiographs demonstrating right glenohumeral arthritis with loss of range of motion of the right shoulder relative to the left shoulder.  After discussion of options, she would like to try glenohumeral injection.  This was administered and patient tolerated procedure well without complication.  She may be having some symptoms that are cervical spine related given the numbness in her hand and the radicular  pain that she has that extends all the way down the arm at times but it seems that most of her pain is coming from the glenohumeral joint itself.  We will see how she does with this injection.  She had excellent relief during the anesthetic portion immediately after injection and this is encouraging for her.  Follow-up as needed.  Follow-Up Instructions: No follow-ups on file.   Orders:  Orders Placed This Encounter  Procedures   XR Shoulder Right   US  Guided Needle Placement - No Linked Charges   No orders of the defined types were placed in this encounter.     Procedures: Large Joint Inj: R glenohumeral on 07/10/2024 11:42 AM Indications: pain and diagnostic evaluation Details: 22 G 3.5 in needle, ultrasound-guided posterior approach Medications: 9 mL bupivacaine  0.25 %; 40 mg methylPREDNISolone  acetate 40 MG/ML Outcome: tolerated well, no immediate complications Procedure, treatment alternatives, risks and benefits explained, specific risks discussed. Consent was given by the patient. Immediately prior to procedure a time out was called to verify the correct patient, procedure, equipment, support staff and site/side marked as required. Patient was prepped and draped in the usual sterile fashion.       Clinical Data: No additional findings.  Objective: Vital Signs: LMP 02/10/2014   Physical Exam:  Constitutional:  Patient appears well-developed HEENT:  Head: Normocephalic Eyes:EOM are normal Neck: Normal range of motion Cardiovascular: Normal rate Pulmonary/chest: Effort normal Neurologic: Patient is alert Skin: Skin is warm Psychiatric: Patient has normal mood and affect  Ortho Exam: s ortho exam demonstrates left shoulder with 70 degrees X rotation, 130 degrees abduction, 180 degrees forward elevation which is compared with the right shoulder with 30 degrees external rotation, 90 degrees abduction, 150 degrees forward elevation passively.  No obvious deformity to  inspection of the shoulder.  Axillary nerve is intact with deltoid firing.  2+ radial pulse of the bilateral upper extremities.  Intact EPL, FPL, finger abduction, pronation/supination, bicep, tricep, deltoid.  Rotator cuff strength testing demonstrates intact rotator cuff strength of supra, infra, subscap.  Tender with palpation over groove but not the AC joint.  Negative crossarm adduction test.  Positive O'Brien sign.  Positive Neer's and Hawkins impingement signs.  No cellulitis or skin changes noted.  Mild crepitus noted with passive motion of the shoulder.  Negative external rotation lag sign.  Negative Hornblower sign.  Negative Spurling sign.  Negative Lhermitte sign.  No pain reproduced with cervical spine range of motion.  Specialty Comments:  EXAM: MRI LUMBAR SPINE WITHOUT CONTRAST   Previous lumbar MRI 02/26/2017.   FINDINGS: Segmentation: Normal, concordant with the thoracic MRI reported separately.   Alignment: Mild dextroconvex lumbar scoliosis seems increased since the 2018 MRI. Relatively stable lumbar lordosis.   Vertebrae: Abnormal discs and endplate changes now at T12-L1 and L4-L5, new since the 2018 MRI.   At L4-L5 there is chronic appearing disc space loss and endplate degeneration with no marrow edema. See additional details of that level below.   At T12-L1 there is conspicuous increased T2 and STIR signal throughout much of the disc space, with bulky surrounding endplate spurring and heterogeneous seemingly acute and chronic marrow signal changes with patchy marrow edema visible on series 108, image 8 in both the T12 and L1 bodies. However, there seems to be developing interbody bridging bone also, flowing peripheral endplate osteophytes, and there is no surrounding paraspinal soft tissue inflammation identified. See additional findings at that level below.   No other marrow edema or acute osseous abnormality. Intact visible sacrum and SI joints.   Conus  medullaris and cauda equina: Conus extends to the L1 level. No lower spinal cord or conus signal abnormality.   Paraspinal and other soft tissues: Stable and negative.   Disc levels:   T12-L1: Abnormal disc and endplate findings as stated above. Subsequent circumferential disc osteophyte complex with a broad-based posterior component asymmetric to the right. Moderate facet hypertrophy also on that side, mild on the left. New spinal stenosis here since 2018 with up to mild mass effect on the conus. No conus signal abnormality. Moderate to severe multifactorial right T12 foraminal stenosis (series 106, image 7).   L1-L2:  This level is largely stable and negative.   L2-L3:  Also largely stable and negative.   L3-L4: Mild circumferential disc bulging here since 2018. Up to moderate facet and ligament flavum hypertrophy has developed with degenerative facet joint fluid. Increased epidural lipomatosis. Mild new spinal stenosis (series 114, image 29). Mild bilateral L3 foraminal stenosis is new.   L4-L5: Moderate to severe disc space loss since 2018. Circumferential disc osteophyte complex asymmetric to the left. Mild to moderate facet and ligament flavum hypertrophy greater on that side. But no significant spinal or lateral recess stenosis. Moderate left L4 foraminal stenosis.   L5-S1: Mild disc space  loss and rightward circumferential disc bulge and endplate spurring since 2018. Increased and moderate facet hypertrophy. No significant spinal or lateral recess stenosis. Moderate to severe right L5 foraminal stenosis is progressed.   IMPRESSION: 1. Abnormal T12-L1 disc and endplate changes asymmetric to the right with mixed marrow signal including some marrow edema. However, there also appears to be developing interbody ankylosis here, and there is no surrounding soft tissue inflammation. This level was normal in 2018. The constellation favors a chronic/healing discitis  osteomyelitis. However, a hyperacute discitis osteomyelitis would be difficult to exclude - in which case a short interval repeat lumbar MRI two weeks would be valuable to document stability. There is associated mild spinal stenosis with mild mass effect on the conus, and moderate to severe right T12 neural foraminal stenosis. No conus signal abnormality.   2. Substantially advanced disc and endplate degeneration since 2018 also at L4-L5, appears chronic. Moderate left L4 neural foraminal stenosis results.   3. Less severe L3-L4 progressed degeneration with new mild multifactorial spinal and bilateral foraminal stenosis there.   4. Progressed L5-S1 degeneration on the right with multifactorial increased and now moderate to severe right L5 neural foraminal stenosis.    On: 12/19/2023 07:47  Imaging: No results found.   PMFS History: Patient Active Problem List   Diagnosis Date Noted   Alcohol abuse, uncomplicated 01/02/2022   Allergic rhinitis due to pollen 01/02/2022   Chronic fatigue syndrome 01/02/2022   Insomnia 01/02/2022   Exercise-induced asthma 01/02/2022   Fatty liver 01/02/2022   Gastroesophageal reflux disease without esophagitis 01/02/2022   Lymphedema of leg 01/02/2022   Macrocytosis 01/02/2022   Monoclonal paraproteinemia 01/02/2022   Obstructive sleep apnea (adult) (pediatric) 01/02/2022   Recurrent major depression in remission 01/02/2022   Sleep disorder 01/02/2022   Eating disorder 07/07/2021   Dyspnea on exertion 06/20/2021   Essential hypertension 06/20/2021   Arthritis of right knee 07/10/2019   Arthritis of knee 07/03/2017   Varicose veins of bilateral lower extremities with other complications 03/20/2017   Acute pain of left knee 01/25/2017   Swelling of left knee joint 01/25/2017   S/P hysterectomy 09/21/2014   Lapband APS July 2015 02/17/2014   Edema-chronic pitting in legs 01/20/2014   Morbid obesity (HCC) 08/28/2013   Past Medical  History:  Diagnosis Date   ADHD    Alcohol abuse    Anxiety    Arthritis    Asthma    EXERCISED INDUCED 2014    Depression    Drug use    Edema, lower extremity    Fatty liver    Fibroids    High blood pressure    Obesity    Seasonal allergies    Sleep apnea    SOB (shortness of breath)    Varicose vein of leg    Vitamin B 12 deficiency     Family History  Problem Relation Age of Onset   Cancer Mother        breast/bilateral mastectomies   High blood pressure Mother    Depression Mother    Heart disease Mother    Anxiety disorder Mother    Obesity Mother    Alcoholism Father    Diabetes Other    Hypertension Other    Stroke Other    Obesity Other     Past Surgical History:  Procedure Laterality Date   BILATERAL SALPINGECTOMY Bilateral 09/21/2014   Procedure: BILATERAL SALPINGECTOMY;  Surgeon: Dickie DELENA Carder, MD;  Location: [facility];  Service: Gynecology;  Laterality: Bilateral;   CESAREAN SECTION  1989   COLONOSCOPY     LAPAROSCOPIC GASTRIC BANDING N/A 02/17/2014   Procedure: LAPAROSCOPIC GASTRIC BANDING;  Surgeon: Donnice KATHEE Lunger, MD;  Location: [facility];  Service: General;  Laterality: N/A;   ROBOT ASSISTED MYOMECTOMY N/A 09/21/2014   Procedure: ROBOTIC ASSISTED MYOMECTOMY;  Surgeon: Dickie DELENA Carder, MD;  Location: [facility];  Service: Gynecology;  Laterality: N/A;   ROBOTIC ASSISTED TOTAL HYSTERECTOMY N/A 09/21/2014   Procedure: ROBOTIC ASSISTED TOTAL HYSTERECTOMY, EXCISION OF PELVIC ENDOMETRIOSIS;  Surgeon: Dickie DELENA Carder, MD;  Location: [facility];  Service: Gynecology;  Laterality: N/A;   TONSILLECTOMY     TOTAL KNEE ARTHROPLASTY Left 07/03/2017   Procedure: LEFT TOTAL KNEE ARTHROPLASTY;  Surgeon: Addie Cordella Hamilton, MD;  Location: [facility] OR;  Service: Orthopedics;  Laterality: Left;   TOTAL KNEE ARTHROPLASTY Right 07/10/2019   Procedure: RIGHT TOTAL KNEE ARTHROPLASTY;  Surgeon: Addie Cordella Hamilton, MD;  Location: [facility] OR;  Service: Orthopedics;  Laterality:  Right;   Social History   Occupational History   Occupation: Self employed  Tobacco Use   Smoking status: Never   Smokeless tobacco: Never  Vaping Use   Vaping status: Never Used  Substance and Sexual Activity   Alcohol use: No   Drug use: No   Sexual activity: Not Currently    Birth control/protection: None        "

## 2024-07-29 ENCOUNTER — Encounter: Payer: Self-pay | Admitting: Cardiology

## 2024-07-29 ENCOUNTER — Ambulatory Visit: Attending: Cardiology | Admitting: Cardiology

## 2024-08-01 ENCOUNTER — Ambulatory Visit: Attending: Cardiology | Admitting: Cardiology

## 2024-08-01 ENCOUNTER — Encounter: Payer: Self-pay | Admitting: Cardiology

## 2024-08-01 VITALS — BP 128/74 | HR 84 | Ht 66.0 in | Wt 304.6 lb

## 2024-08-01 DIAGNOSIS — G4733 Obstructive sleep apnea (adult) (pediatric): Secondary | ICD-10-CM

## 2024-08-01 DIAGNOSIS — R0609 Other forms of dyspnea: Secondary | ICD-10-CM

## 2024-08-01 DIAGNOSIS — I10 Essential (primary) hypertension: Secondary | ICD-10-CM

## 2024-08-01 MED ORDER — METOPROLOL TARTRATE 50 MG PO TABS: 50.0000 mg | ORAL_TABLET | ORAL | 0 refills | Status: AC

## 2024-08-01 NOTE — Progress Notes (Signed)
 " Cardiology Office Note:  .   Date:  08/01/2024  ID:  [name], DOB 05/14/1966, MRN [id number] PCP: Teresa Channel, MD  [facility] Providers Cardiologist:  None    History of Present Illness: .   Mary Morrison is a 59 y.o. female Discussed the use of AI scribe   History of Present Illness Mary Morrison is a 59 year old female with asthma and venous insufficiency who presents with dyspnea on exertion. She was referred by her primary care provider, Dr. Teresa, for evaluation of her shortness of breath and to assess her suitability for Vyvanse.  She experiences progressively worsening dyspnea on exertion, significantly affecting her ability to climb stairs or walk short distances, such as a block to the corner store, necessitating frequent stops to catch her breath. This is associated with a recent weight gain of over 50 pounds since her last visit in 2022.  She has a history of venous insufficiency and underwent vein ablation sclerotherapy in 2018. She has also been treated for asthma in the past. Her recent echocardiogram in 2022 showed normal pump function, and her calcium score was zero at that time.  She has a history of binge eating, particularly at night, which she describes as a compulsion. She can go all day without eating but feels a strong urge to eat large amounts at night, sometimes getting up from bed to eat.  Her primary care provider recently increased her blood pressure medication due to elevated readings, although her blood pressure was stable during today's visit. She also has a history of sleep apnea and has been prescribed Zepbound, which is covered by her insurance due to her sleep apnea diagnosis.  She has experienced significant weight gain and depression following job loss during the pandemic, impacting her physical activity and overall health. She used to be an merchandiser, retail, participating in half marathons and triathlons, but has since  undergone two knee replacement surgeries.       Studies Reviewed: SABRA   EKG Interpretation Date/Time:  Friday August 01 2024 11:26:17 EST Ventricular Rate:  84 PR Interval:  146 QRS Duration:  76 QT Interval:  372 QTC Calculation: 439 R Axis:   40  Text Interpretation: Sinus rhythm with occasional Premature ventricular complexes When compared with ECG of 27-Jun-2017 15:45, Premature ventricular complexes are now Present Confirmed by Jeffrie Anes ([id number]) on 08/01/2024 11:41:38 AM    Results Radiology Coronary artery calcium score (2022): Calcium score zero  Diagnostic Echocardiogram (2022): Normal left ventricular systolic function EKG: Single premature ventricular contraction, otherwise within normal limits (Independently interpreted) Risk Assessment/Calculations:            Physical Exam:   VS:  BP 128/74   Pulse 84   Ht 5' 6 (1.676 m)   Wt (!) 304 lb 9.6 oz (138.2 kg)   LMP 02/10/2014   SpO2 97%   BMI 49.16 kg/m    Wt Readings from Last 3 Encounters:  08/01/24 (!) 304 lb 9.6 oz (138.2 kg)  04/21/24 (!) 310 lb (140.6 kg)  02/27/24 298 lb 12.8 oz (135.5 kg)    GEN: Well nourished, well developed in no acute distress NECK: No JVD; No carotid bruits CARDIAC: RRR, no murmurs, no rubs, no gallops RESPIRATORY:  Clear to auscultation without rales, wheezing or rhonchi  ABDOMEN: Soft, non-tender, non-distended EXTREMITIES:  No edema; No deformity   ASSESSMENT AND PLAN: .    Assessment and Plan Assessment & Plan Dyspnea on exertion,  possible anginal equivalent Dyspnea on exertion, particularly with activities such as climbing stairs and walking short distances. Possible anginal equivalent considered due to lack of classic chest pain symptoms. Previous echocardiogram in 2022 showed normal pump function. Coronary artery disease considered as a differential diagnosis. - Ordered echocardiogram to assess cardiac function - Ordered coronary CT scan to evaluate for coronary  artery disease, shortness of breath could be anginal equivalent. - Will preauthorize tests through insurance  Essential hypertension Blood pressure management ongoing with recent adjustment by primary care physician. Current blood pressure readings are well-controlled during the visit.  Obesity with binge eating disorder Significant weight gain of over 50 pounds since last visit. Binge eating disorder with nocturnal episodes. Discussion of potential use of Vyvanse for binge eating disorder, pending cardiac evaluation due to its amphetamine nature. Zepbound prescribed by primary care physician for weight management, covered due to obstructive sleep apnea diagnosis. - Will evaluate cardiac status before initiating Vyvanse - Continue Zepbound for weight management  Obstructive sleep apnea Diagnosis of obstructive sleep apnea contributing to weight management challenges. Zepbound prescribed by primary care physician, covered due to sleep apnea diagnosis. - Continue Zepbound for weight management         Dispo: We will follow-up with results of testing  Signed, Oneil Parchment, MD  "

## 2024-08-01 NOTE — Patient Instructions (Signed)
 Medication Instructions:  The current medical regimen is effective;  continue present plan and medications.  *If you need a refill on your cardiac medications before your next appointment, please call your pharmacy*   Testing/Procedures: Your physician has requested that you have an echocardiogram. Echocardiography is a painless test that uses sound waves to create images of your heart. It provides your doctor with information about the size and shape of your heart and how well your heart's chambers and valves are working. This procedure takes approximately one hour. There are no restrictions for this procedure. Please do NOT wear cologne, perfume, aftershave, or lotions (deodorant is allowed). Please arrive 15 minutes prior to your appointment time.  Please note: We ask at that you not bring children with you during ultrasound (echo/ vascular) testing. Due to room size and safety concerns, children are not allowed in the ultrasound rooms during exams. Our front office staff cannot provide observation of children in our lobby area while testing is being conducted. An adult accompanying a patient to their appointment will only be allowed in the ultrasound room at the discretion of the ultrasound technician under special circumstances. We apologize for any inconvenience.    Your cardiac CT will be scheduled at:  [facility] [address]  [place], [place] [address]  Please enter the parking lot using the [address] entrance and use the FREE valet service at the patient drop-off area. Enter the building and check-in with registration on the main floor.  Please follow these instructions carefully (unless otherwise directed):  An IV will be required for this test and Nitroglycerin will be given.  Hold all erectile dysfunction medications at least 3 days (72 hrs) prior to test. (Ie viagra, cialis, sildenafil, tadalafil, etc)   On the Night Before the Test: Be  sure to Drink plenty of water. Do not consume any caffeinated/decaffeinated beverages or chocolate 12 hours prior to your test. Do not take any antihistamines 12 hours prior to your test.  On the Day of the Test Drink plenty of water until 1 hour prior to the test. Do not eat any food 1 hour prior to test. You may take your regular medications prior to the test.  Take metoprolol (Lopressor) two hours prior to test. If you take Furosemide/Hydrochlorothiazide /Spironolactone/Chlorthalidone, please HOLD on the morning of the test. Patients who wear a continuous glucose monitor MUST remove the device prior to scanning. FEMALES- please wear underwire-free bra if available, avoid dresses & tight clothing  After the Test: Drink plenty of water. After receiving IV contrast, you may experience a mild flushed feeling. This is normal. On occasion, you may experience a mild rash up to 24 hours after the test. This is not dangerous. If this occurs, you can take Benadryl 25 mg, Zyrtec, Claritin, or Allegra and increase your fluid intake. (Patients taking Tikosyn should avoid Benadryl, and may take Zyrtec, Claritin, or Allegra) If you experience trouble breathing, this can be serious. If it is severe call 911 IMMEDIATELY. If it is mild, please call our office.  We will call to schedule your test 2-4 weeks out understanding that some insurance companies will need an authorization prior to the service being performed.   For more information and frequently asked questions, please visit our website : [email or web address]  For non-scheduling related questions, please contact the cardiac imaging nurse navigator should you have any questions/concerns: Cardiac Imaging Nurse Navigators Direct Office Dial: [phone]   For scheduling needs, including cancellations and  rescheduling, please call Brittany, [phone].   Follow-Up: At [facility], you and your health needs are our  priority.  As part of our continuing mission to provide you with exceptional heart care, our providers are all part of one team.  This team includes your primary Cardiologist (physician) and Advanced Practice Providers or APPs (Physician Assistants and Nurse Practitioners) who all work together to provide you with the care you need, when you need it.  Your next appointment:   Follow up will be based on the results of the above testing.   We recommend signing up for the patient portal called MyChart.  Sign up information is provided on this After Visit Summary.  MyChart is used to connect with patients for Virtual Visits (Telemedicine).  Patients are able to view lab/test results, encounter notes, upcoming appointments, etc.  Non-urgent messages can be sent to your provider as well.   To learn more about what you can do with MyChart, go to [email or web address].

## 2024-09-04 ENCOUNTER — Ambulatory Visit (HOSPITAL_COMMUNITY)
